# Patient Record
Sex: Male | Born: 1960 | Race: White | Hispanic: No | Marital: Married | Smoking: Former smoker
Health system: Southern US, Community
[De-identification: ages and names within clinical notes are randomized; demographics above are authoritative.]

## PROBLEM LIST (undated history)

## (undated) DIAGNOSIS — M1990 Unspecified osteoarthritis, unspecified site: Secondary | ICD-10-CM

## (undated) DIAGNOSIS — F419 Anxiety disorder, unspecified: Secondary | ICD-10-CM

## (undated) DIAGNOSIS — E785 Hyperlipidemia, unspecified: Secondary | ICD-10-CM

## (undated) DIAGNOSIS — I10 Essential (primary) hypertension: Secondary | ICD-10-CM

## (undated) DIAGNOSIS — G709 Myoneural disorder, unspecified: Secondary | ICD-10-CM

## (undated) DIAGNOSIS — M2550 Pain in unspecified joint: Secondary | ICD-10-CM

## (undated) DIAGNOSIS — F329 Major depressive disorder, single episode, unspecified: Secondary | ICD-10-CM

## (undated) DIAGNOSIS — K760 Fatty (change of) liver, not elsewhere classified: Secondary | ICD-10-CM

## (undated) DIAGNOSIS — M25551 Pain in right hip: Secondary | ICD-10-CM

## (undated) DIAGNOSIS — Z9889 Other specified postprocedural states: Secondary | ICD-10-CM

## (undated) DIAGNOSIS — R600 Localized edema: Secondary | ICD-10-CM

## (undated) DIAGNOSIS — M5030 Other cervical disc degeneration, unspecified cervical region: Secondary | ICD-10-CM

## (undated) DIAGNOSIS — E039 Hypothyroidism, unspecified: Secondary | ICD-10-CM

## (undated) DIAGNOSIS — K449 Diaphragmatic hernia without obstruction or gangrene: Secondary | ICD-10-CM

## (undated) DIAGNOSIS — F32A Depression, unspecified: Secondary | ICD-10-CM

## (undated) DIAGNOSIS — M549 Dorsalgia, unspecified: Secondary | ICD-10-CM

## (undated) DIAGNOSIS — E079 Disorder of thyroid, unspecified: Secondary | ICD-10-CM

## (undated) DIAGNOSIS — R112 Nausea with vomiting, unspecified: Secondary | ICD-10-CM

## (undated) DIAGNOSIS — E119 Type 2 diabetes mellitus without complications: Secondary | ICD-10-CM

## (undated) DIAGNOSIS — M4802 Spinal stenosis, cervical region: Secondary | ICD-10-CM

## (undated) DIAGNOSIS — K219 Gastro-esophageal reflux disease without esophagitis: Secondary | ICD-10-CM

## (undated) DIAGNOSIS — R002 Palpitations: Secondary | ICD-10-CM

## (undated) HISTORY — DX: Gastro-esophageal reflux disease without esophagitis: K21.9

## (undated) HISTORY — PX: TONSILLECTOMY: SUR1361

## (undated) HISTORY — DX: Morbid (severe) obesity due to excess calories: E66.01

## (undated) HISTORY — PX: HYDROCELE EXCISION: SHX482

## (undated) HISTORY — DX: Other cervical disc degeneration, unspecified cervical region: M50.30

## (undated) HISTORY — DX: Depression, unspecified: F32.A

## (undated) HISTORY — DX: Pain in right hip: M25.551

## (undated) HISTORY — PX: CARDIAC CATHETERIZATION: SHX172

## (undated) HISTORY — DX: Localized edema: R60.0

## (undated) HISTORY — DX: Dorsalgia, unspecified: M54.9

## (undated) HISTORY — DX: Anxiety disorder, unspecified: F41.9

## (undated) HISTORY — DX: Spinal stenosis, cervical region: M48.02

## (undated) HISTORY — DX: Pain in unspecified joint: M25.50

## (undated) HISTORY — DX: Diaphragmatic hernia without obstruction or gangrene: K44.9

## (undated) HISTORY — DX: Palpitations: R00.2

## (undated) HISTORY — PX: LAPAROSCOPIC GASTRIC BANDING: SHX1100

## (undated) HISTORY — PX: OTHER SURGICAL HISTORY: SHX169

---
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
## 1988-09-05 DIAGNOSIS — J189 Pneumonia, unspecified organism: Secondary | ICD-10-CM

## 1988-09-05 HISTORY — DX: Pneumonia, unspecified organism: J18.9

## 1998-06-05 ENCOUNTER — Inpatient Hospital Stay (HOSPITAL_COMMUNITY): Admission: AD | Admit: 1998-06-05 | Discharge: 1998-06-06 | Payer: Self-pay | Admitting: Cardiology

## 2012-02-04 HISTORY — PX: LAPAROSCOPIC ROUX-EN-Y GASTRIC BYPASS WITH UPPER ENDOSCOPY AND REMOVAL OF LAP BAND: SHX6505

## 2019-10-15 ENCOUNTER — Other Ambulatory Visit: Payer: Self-pay

## 2019-10-15 ENCOUNTER — Ambulatory Visit
Admission: EM | Admit: 2019-10-15 | Discharge: 2019-10-15 | Disposition: A | Discharge status: Home / Self Care | Payer: PRIVATE HEALTH INSURANCE | Attending: Emergency Medicine | Admitting: Emergency Medicine

## 2019-10-15 ENCOUNTER — Encounter: Payer: Self-pay | Admitting: Emergency Medicine

## 2019-10-15 DIAGNOSIS — L02212 Cutaneous abscess of back [any part, except buttock]: Secondary | ICD-10-CM | POA: Diagnosis not present

## 2019-10-15 HISTORY — DX: Essential (primary) hypertension: I10

## 2019-10-15 HISTORY — DX: Disorder of thyroid, unspecified: E07.9

## 2019-10-15 HISTORY — DX: Hyperlipidemia, unspecified: E78.5

## 2019-10-15 HISTORY — DX: Type 2 diabetes mellitus without complications: E11.9

## 2019-10-15 MED ORDER — CEPHALEXIN 250 MG PO CAPS: 250.0000 mg | ORAL_CAPSULE | Freq: Four times a day (QID) | ORAL | 0 refills | Status: DC

## 2019-10-15 MED ORDER — MUPIROCIN CALCIUM 2 % EX CREA: 1.0000 "application " | TOPICAL_CREAM | Freq: Two times a day (BID) | CUTANEOUS | 0 refills | Status: DC

## 2019-10-15 NOTE — Discharge Instructions (Addendum)
May apply warm compresses  Wash site daily with warm water and mild soap Take antibiotic as prescribed and to completion Follow up here or with PCP if symptoms persists Return or go to the ED if you have any new or worsening symptoms

## 2019-10-15 NOTE — ED Provider Notes (Signed)
[facility]    CSN: [id number] Arrival date & time: 10/15/19  1023      History   Chief Complaint Chief Complaint  Patient presents with  . Abscess    HPI Daniel Scott is a 59 y.o. male.   Who presented to the urgent care with a complaint of abscess to mid back mass, in the past few months.  Reported drainage of White color and redness.  Denies any precipitating event.  He has not used any medication.  Nothing make his symptoms worse.  Denies chills, fever, nausea, vomiting, diarrhea, chest pain, chest tightness, confusion.    The history is provided by the patient. No language interpreter was used.  Abscess   Past Medical History:  Diagnosis Date  . Diabetes mellitus without complication (HCC)   . Hyperlipidemia   . Hypertension   . Thyroid disease     There are no problems to display for this patient.   Past Surgical History:  Procedure Laterality Date  . HYDROCELE EXCISION    . LAPAROSCOPIC GASTRIC BANDING         Home Medications    Prior to Admission medications   Medication Sig Start Date End Date Taking? Authorizing Provider  amLODipine (NORVASC) 10 MG tablet Take 10 mg by mouth daily.   Yes [provider]  aspirin 325 MG tablet Take 325 mg by mouth daily.   Yes [provider]  atorvastatin (LIPITOR) 80 MG tablet Take 80 mg by mouth daily.   Yes [provider]  cholecalciferol (VITAMIN D3) 25 MCG (1000 UNIT) tablet Take 1,000 Units by mouth daily.   Yes [provider]  Dulaglutide (TRULICITY) 1.5 MG/0.5ML SOPN Inject into the skin.   Yes [provider]  Ertugliflozin L-PyroglutamicAc (STEGLATRO) 15 MG TABS Take by mouth.   Yes [provider]  gabapentin (NEURONTIN) 600 MG tablet Take 600 mg by mouth 3 (three) times daily.   Yes [provider]  Garlic 10 MG CAPS Take by mouth.   Yes [provider]  Boris Lown Oil 1000 MG CAPS Take by mouth.   Yes [provider]  levothyroxine (SYNTHROID) 75 MCG tablet Take 75 mcg by mouth daily before breakfast.   Yes [provider]  lisinopril (ZESTRIL) 20 MG tablet Take 20 mg by mouth daily.   Yes [provider]  metFORMIN (GLUCOPHAGE) 500 MG tablet Take by mouth 2 (two) times daily with a meal.   Yes [provider]  Multiple Vitamin (MULTIVITAMIN ADULT PO) Take by mouth.   Yes [provider]  omeprazole (PRILOSEC) 10 MG capsule Take 10 mg by mouth daily.   Yes [provider]  spironolactone (ALDACTONE) 25 MG tablet Take 25 mg by mouth daily.   Yes [provider]  vitamin B-12 (CYANOCOBALAMIN) 500 MCG tablet Take 500 mcg by mouth daily.   Yes [provider]  cephALEXin (KEFLEX) 250 MG capsule Take 1 capsule (250 mg total) by mouth 4 (four) times daily. 10/15/19   Romayne Ticas, Zachery Dakins, FNP  mupirocin cream (BACTROBAN) 2 % Apply 1 application topically 2 (two) times daily. 10/15/19   AvegnoZachery Dakins, FNP    Family History Family History  Problem Relation Age of Onset  . Vascular Disease Mother   . Cancer Mother   . Coronary artery disease Father   . Cancer Father     Social History Social History   Tobacco Use  . Smoking status: Former Games developer  .  Smokeless tobacco: Never Used  Substance Use Topics  . Alcohol use: Yes    Comment: moderately  . Drug use: Never     Allergies   Sulfa antibiotics   Review of Systems Review of Systems  Constitutional: Negative.   Respiratory: Negative.   Cardiovascular: Negative.   Skin: Positive for color change.  All other systems reviewed and are negative.    Physical Exam Triage Vital Signs ED Triage Vitals [10/15/19 1037]  Enc Vitals Group     BP      Pulse      Resp      Temp      Temp src      SpO2      Weight      Height      Head Circumference      Peak Flow      Pain Score 2     Pain Loc      Pain Edu?      Excl. in [place]?    No data found.  Updated Vital  Signs BP 118/66   Pulse 76   Temp 98 F (36.7 C)   Resp 18   SpO2 95%   Visual Acuity Right Eye Distance:   Left Eye Distance:   Bilateral Distance:    Right Eye Near:   Left Eye Near:    Bilateral Near:     Physical Exam Constitutional:      General: He is not in acute distress.    Appearance: Normal appearance. He is normal weight. He is not ill-appearing, toxic-appearing or diaphoretic.  Cardiovascular:     Rate and Rhythm: Normal rate and regular rhythm.     Pulses: Normal pulses.     Heart sounds: Normal heart sounds. No murmur. No gallop.   Pulmonary:     Effort: Pulmonary effort is normal. No respiratory distress.     Breath sounds: Normal breath sounds. No stridor. No wheezing, rhonchi or rales.  Chest:     Chest wall: No tenderness.  Skin:    General: Skin is warm.     Findings: Abscess and erythema present. No abrasion, bruising, ecchymosis, signs of injury, lesion or rash.  Neurological:     Mental Status: He is alert.      UC Treatments / Results  Labs (all labs ordered are listed, but only abnormal results are displayed) Labs Reviewed - No data to display  EKG   Radiology No results found.  Procedures Procedures: incision and debridement 11:00 AM  Procedure: Verbal consent obtained. Area over induration cleaned with betadine. Lidocaine 2% with epinephrine used to obtain local anesthesia. The most fluctuant portion of the abscess was incised with a #11 blade scalpel. Abscess cavity explored and evacuated. Loculations broken up with a curved hemostat as best as possible given patient discomfort. Cavity packed with packing material and dressed with a clean gauze dressing. Minimal bleeding. No complications.  Medications Ordered in UC Medications - No data to display  Initial Impression / Assessment and Plan / UC Course  I have reviewed the triage vital signs and the nursing notes.  Pertinent labs & imaging results that were available during my  care of the patient were reviewed by me and considered in my medical decision making (see chart for details).    Abscess of back Incision and debridement was completed Bactroban was prescribed Keflex was prescribed Care instruction was given Advised patient to return for worsening of symptoms  Final Clinical Impressions(s) /  UC Diagnoses   Final diagnoses:  Abscess of back     Discharge Instructions     Apply warm compresses 3-4x daily for 10-15 minutes Wash site daily with warm water and mild soap Take antibiotic as prescribed and to completion Follow up here or with PCP if symptoms persists Return or go to the ED if you have any new or worsening symptoms     ED Prescriptions    Medication Sig Dispense Auth. Provider   mupirocin cream (BACTROBAN) 2 % Apply 1 application topically 2 (two) times daily. 15 g Jagar Lua, Zachery Dakins, FNP   cephALEXin (KEFLEX) 250 MG capsule Take 1 capsule (250 mg total) by mouth 4 (four) times daily. 28 capsule Nikaya Nasby, Zachery Dakins, FNP     PDMP not reviewed this encounter.   Durward Parcel, FNP 10/15/19 1120

## 2019-10-15 NOTE — ED Triage Notes (Addendum)
Pt presents with complaints of possible abscess to mid back. Pt states that he has had a benign bump there for a few years. Reports over the last few weeks the area has started to swell and been painful. The area is golf ball size.

## 2019-11-09 ENCOUNTER — Ambulatory Visit: Payer: PRIVATE HEALTH INSURANCE | Attending: Internal Medicine

## 2019-11-09 DIAGNOSIS — Z23 Encounter for immunization: Secondary | ICD-10-CM | POA: Insufficient documentation

## 2019-11-09 NOTE — Progress Notes (Signed)
   Covid-19 Vaccination Clinic  Name:  Joni Norrod    MRN: [id number] DOB: 1961/03/21  11/09/2019  Mr. Mccrystal Sr was observed post Covid-19 immunization for 15 minutes without incident. He was provided with Vaccine Information Sheet and instruction to access the V-Safe system.   Mr. Shearn Sr was instructed to call 911 with any severe reactions post vaccine: Marland Kitchen Difficulty breathing  . Swelling of face and throat  . A fast heartbeat  . A bad rash all over body  . Dizziness and weakness   Immunizations Administered    Name Date Dose VIS Date Route   Moderna COVID-19 Vaccine 11/09/2019  9:29 AM 0.5 mL 08/06/2019 Intramuscular   Manufacturer: Moderna   Lot: [id number]   NDC: 51833-582-51

## 2019-12-09 ENCOUNTER — Encounter: Payer: Self-pay | Admitting: Family Medicine

## 2019-12-09 ENCOUNTER — Ambulatory Visit (INDEPENDENT_AMBULATORY_CARE_PROVIDER_SITE_OTHER): Payer: PRIVATE HEALTH INSURANCE | Admitting: Family Medicine

## 2019-12-09 ENCOUNTER — Other Ambulatory Visit: Payer: Self-pay

## 2019-12-09 VITALS — BP 127/82 | HR 74 | Temp 97.1°F | Ht 70.0 in | Wt 342.0 lb

## 2019-12-09 DIAGNOSIS — M5412 Radiculopathy, cervical region: Secondary | ICD-10-CM | POA: Diagnosis not present

## 2019-12-09 DIAGNOSIS — Z130 Encounter for screening for diseases of the blood and blood-forming organs and certain disorders involving the immune mechanism: Secondary | ICD-10-CM

## 2019-12-09 DIAGNOSIS — E039 Hypothyroidism, unspecified: Secondary | ICD-10-CM

## 2019-12-09 DIAGNOSIS — Z794 Long term (current) use of insulin: Secondary | ICD-10-CM

## 2019-12-09 DIAGNOSIS — E1165 Type 2 diabetes mellitus with hyperglycemia: Secondary | ICD-10-CM

## 2019-12-09 LAB — BAYER DCA HB A1C WAIVED: HB A1C (BAYER DCA - WAIVED): 7 % — ABNORMAL HIGH (ref <7.0)

## 2019-12-09 MED ORDER — TRULICITY 1.5 MG/0.5ML ~~LOC~~ SOAJ: 1.5000 mg | SUBCUTANEOUS | 1 refills | Status: DC

## 2019-12-09 MED ORDER — AMLODIPINE BESYLATE 10 MG PO TABS: 10.0000 mg | ORAL_TABLET | Freq: Every day | ORAL | 1 refills | Status: DC

## 2019-12-09 MED ORDER — SPIRONOLACTONE 25 MG PO TABS: 25.0000 mg | ORAL_TABLET | Freq: Every day | ORAL | 1 refills | Status: DC

## 2019-12-09 MED ORDER — METFORMIN HCL 500 MG PO TABS: 500.0000 mg | ORAL_TABLET | Freq: Two times a day (BID) | ORAL | 1 refills | Status: DC

## 2019-12-09 MED ORDER — GABAPENTIN 600 MG PO TABS: 600.0000 mg | ORAL_TABLET | Freq: Every day | ORAL | 1 refills | Status: DC

## 2019-12-09 MED ORDER — LEVOTHYROXINE SODIUM 75 MCG PO TABS: 75.0000 ug | ORAL_TABLET | Freq: Every day | ORAL | 1 refills | Status: DC

## 2019-12-09 MED ORDER — PREDNISONE 10 MG (21) PO TBPK: ORAL_TABLET | ORAL | 0 refills | Status: DC

## 2019-12-09 MED ORDER — ATORVASTATIN CALCIUM 80 MG PO TABS: 80.0000 mg | ORAL_TABLET | Freq: Every day | ORAL | 1 refills | Status: DC

## 2019-12-09 MED ORDER — LISINOPRIL 20 MG PO TABS: 20.0000 mg | ORAL_TABLET | Freq: Every day | ORAL | 1 refills | Status: DC

## 2019-12-09 NOTE — Patient Instructions (Signed)

## 2019-12-09 NOTE — Progress Notes (Signed)
New Patient Office Visit  Assessment & Plan:  1. Cervical radiculopathy - Education provided on cervical radiculopathy. Trial of steroids to see if this improves symptoms.  - predniSONE (STERAPRED UNI-PAK 21 TAB) 10 MG (21) TBPK tablet; As directed x 6 days  Dispense: 21 tablet; Refill: 0  2. Type 2 diabetes mellitus with hyperglycemia, with long-term current use of insulin (HCC) Lab Results  Component Value Date   HGBA1C 7.0 (H) 12/09/2019  - Diabetes is not at goal of A1c < 7 but is very close. - Medications: continue current medications - Patient is currently taking a statin. Patient is taking an ACE-inhibitor/ARB.  - CMP14+EGFR - Lipid panel - Bayer DCA Hb A1c Waived  3. Hypothyroidism, unspecified type - Well controlled on current regimen.  - TSH  4. Screening for deficiency anemia - CBC with Differential/Platelet   Follow-up: Return in about 3 months (around 03/09/2020) for DM.   Hendricks Limes, MSN, APRN, FNP-C [facility]  Subjective:  Patient ID: Daniel Scott, male    DOB: 16-Aug-1961  Age: 59 y.o. MRN: [id number]  Patient Care Team: Loman Brooklyn, FNP as PCP - General (Family Medicine)  CC:  Chief Complaint  Patient presents with  . New Patient (Initial Visit)  . Establish Care  . Spinal Stenosis    Chronic medical condition.  Patient states that he has started losing strength in his left arm.    HPI Daniel Scott presents to establish care. Patient is transferring from [place] and [place].   Patient reports he has been taking steglatro and wants to know if he needs to be on it. He feels it was mistakenly re-prescribed and he didn't notice, therefore he has been taking it.   Patient reports he has chronic neck and left shoulder pain. He feels he is now losing strength in his left arm which is problematic as he is left handed. He has had injections in his neck in the past when he was in [place]. The return of the pain and  weakness has been in the past two weeks. Movement makes it worse.    Review of Systems  Constitutional: Negative for chills, fever, malaise/fatigue and weight loss.  HENT: Negative for congestion, ear discharge, ear pain, nosebleeds, sinus pain, sore throat and tinnitus.   Eyes: Negative for blurred vision, double vision, pain, discharge and redness.  Respiratory: Negative for cough, shortness of breath and wheezing.   Cardiovascular: Negative for chest pain, palpitations and leg swelling.  Gastrointestinal: Negative for abdominal pain, constipation, diarrhea, heartburn, nausea and vomiting.  Genitourinary: Negative for dysuria, frequency and urgency.  Musculoskeletal: Positive for joint pain and neck pain. Negative for myalgias.  Skin: Negative for rash.  Neurological: Negative for dizziness, seizures, weakness and headaches.  Psychiatric/Behavioral: Negative for depression, substance abuse and suicidal ideas. The patient is not nervous/anxious.     Current Outpatient Medications:  .  amLODipine (NORVASC) 10 MG tablet, Take 1 tablet (10 mg total) by mouth daily., Disp: 90 tablet, Rfl: 1 .  aspirin 325 MG tablet, Take 325 mg by mouth daily., Disp: , Rfl:  .  atorvastatin (LIPITOR) 80 MG tablet, Take 1 tablet (80 mg total) by mouth daily., Disp: 90 tablet, Rfl: 1 .  cholecalciferol (VITAMIN D3) 25 MCG (1000 UNIT) tablet, Take 5,000 Units by mouth daily. , Disp: , Rfl:  .  Dulaglutide (TRULICITY) 1.5 GS/8.1JS SOPN, Inject 1.5 mg into the skin once a week., Disp: 12 pen, Rfl:  1 .  Ertugliflozin L-PyroglutamicAc (STEGLATRO) 15 MG TABS, Take 15 mg by mouth daily. , Disp: , Rfl:  .  gabapentin (NEURONTIN) 600 MG tablet, Take 1 tablet (600 mg total) by mouth daily., Disp: 90 tablet, Rfl: 1 .  Garlic 10 MG CAPS, Take 10 mg by mouth in the morning and at bedtime. , Disp: , Rfl:  .  Krill Oil 1000 MG CAPS, Take by mouth., Disp: , Rfl:  .  levothyroxine (SYNTHROID) 75 MCG tablet, Take 1 tablet (75  mcg total) by mouth daily before breakfast., Disp: 90 tablet, Rfl: 1 .  lisinopril (ZESTRIL) 20 MG tablet, Take 1 tablet (20 mg total) by mouth daily., Disp: 90 tablet, Rfl: 1 .  metFORMIN (GLUCOPHAGE) 500 MG tablet, Take 1 tablet (500 mg total) by mouth 2 (two) times daily with a meal., Disp: 180 tablet, Rfl: 1 .  Multiple Vitamin (MULTIVITAMIN ADULT PO), Take by mouth., Disp: , Rfl:  .  omeprazole (PRILOSEC) 20 MG capsule, Take 20 mg by mouth daily. 1 tablet every other day, Disp: , Rfl:  .  spironolactone (ALDACTONE) 25 MG tablet, Take 1 tablet (25 mg total) by mouth daily., Disp: 90 tablet, Rfl: 1 .  vitamin B-12 (CYANOCOBALAMIN) 500 MCG tablet, Take 5,000 mcg by mouth daily. , Disp: , Rfl:  .  predniSONE (STERAPRED UNI-PAK 21 TAB) 10 MG (21) TBPK tablet, As directed x 6 days, Disp: 21 tablet, Rfl: 0  Allergies  Allergen Reactions  . Sulfa Antibiotics     Past Medical History:  Diagnosis Date  . Depression   . Diabetes mellitus without complication ([place])   . Hiatal hernia   . Hyperlipidemia   . Hypertension   . Stenosis of cervical spine   . Thyroid disease     Past Surgical History:  Procedure Laterality Date  . HYDROCELE EXCISION    . LAPAROSCOPIC GASTRIC BANDING      Family History  Problem Relation Age of Onset  . Vascular Disease Mother   . Cancer Mother   . Kidney disease Mother   . Thyroid disease Mother   . Hypertension Mother   . Coronary artery disease Father   . Colon cancer Father   . Heart disease Father   . Hyperlipidemia Father   . Hypertension Father   . Spina bifida Daughter   . Depression Son   . Anxiety disorder Son   . Hypertension Son   . Breast cancer Maternal Grandmother   . Hypertension Maternal Grandmother   . Stroke Maternal Grandmother   . Lung cancer Maternal Grandfather   . Diabetes Maternal Grandfather   . Heart disease Maternal Grandfather   . Hypertension Maternal Grandfather   . Stroke Maternal Grandfather   . Hypertension  Paternal Grandmother   . Heart disease Paternal Grandfather   . Hypertension Paternal Grandfather   . Autism Son   . Bipolar disorder Son   . Autism Son     Social History   Socioeconomic History  . Marital status: Married    Spouse name: Not on file  . Number of children: Not on file  . Years of education: Not on file  . Highest education level: Not on file  Occupational History  . Not on file  Tobacco Use  . Smoking status: Former Smoker    Types: Cigarettes    Quit date: 05/2009    Years since quitting: 10.6  . Smokeless tobacco: Never Used  Substance and Sexual Activity  . Alcohol use: Yes  Comment: moderately. 12/09/19- Patient states he has 12 drinks a week.  . Drug use: Yes    Types: Marijuana    Comment: occ  . Sexual activity: Yes    Birth control/protection: None  Other Topics Concern  . Not on file  Social History Narrative  . Not on file   Social Determinants of Health   Financial Resource Strain:   . Difficulty of Paying Living Expenses:   Food Insecurity:   . Worried About Charity fundraiser in the Last Year:   . Arboriculturist in the Last Year:   Transportation Needs:   . Film/video editor (Medical):   Marland Kitchen Lack of Transportation (Non-Medical):   Physical Activity:   . Days of Exercise per Week:   . Minutes of Exercise per Session:   Stress:   . Feeling of Stress :   Social Connections:   . Frequency of Communication with Friends and Family:   . Frequency of Social Gatherings with Friends and Family:   . Attends Religious Services:   . Active Member of Clubs or Organizations:   . Attends Archivist Meetings:   Marland Kitchen Marital Status:   Intimate Partner Violence:   . Fear of Current or Ex-Partner:   . Emotionally Abused:   Marland Kitchen Physically Abused:   . Sexually Abused:     Objective:   Today's Vitals: BP 127/82   Pulse 74   Temp (!) 97.1 F (36.2 C) (Temporal)   Ht 5' 10"  (1.778 m)   Wt (!) 342 lb (155.1 kg)   SpO2 98%   BMI  49.07 kg/m   Physical Exam Vitals reviewed.  Constitutional:      General: He is not in acute distress.    Appearance: Normal appearance. He is morbidly obese. He is not ill-appearing, toxic-appearing or diaphoretic.  HENT:     Head: Normocephalic and atraumatic.  Eyes:     General: No scleral icterus.       Right eye: No discharge.        Left eye: No discharge.     Conjunctiva/sclera: Conjunctivae normal.  Cardiovascular:     Rate and Rhythm: Normal rate and regular rhythm.     Heart sounds: Normal heart sounds. No murmur. No friction rub. No gallop.   Pulmonary:     Effort: Pulmonary effort is normal. No respiratory distress.     Breath sounds: Normal breath sounds. No stridor. No wheezing, rhonchi or rales.  Musculoskeletal:        General: Normal range of motion.     Cervical back: Normal range of motion.  Skin:    General: Skin is warm and dry.  Neurological:     Mental Status: He is alert and oriented to person, place, and time. Mental status is at baseline.  Psychiatric:        Mood and Affect: Mood normal.        Behavior: Behavior normal.        Thought Content: Thought content normal.        Judgment: Judgment normal.

## 2019-12-10 LAB — LIPID PANEL
Chol/HDL Ratio: 3 ratio (ref 0.0–5.0)
Cholesterol, Total: 136 mg/dL (ref 100–199)
HDL: 45 mg/dL (ref >39)
LDL Chol Calc (NIH): 67 mg/dL (ref 0–99)
Triglycerides: 140 mg/dL (ref 0–149)
VLDL Cholesterol Cal: 24 mg/dL (ref 5–40)

## 2019-12-10 LAB — CBC WITH DIFFERENTIAL/PLATELET
Basophils Absolute: 0 10*3/uL (ref 0.0–0.2)
Basos: 1 %
EOS (ABSOLUTE): 0.1 10*3/uL (ref 0.0–0.4)
Eos: 2 %
Hematocrit: 41.5 % (ref 37.5–51.0)
Hemoglobin: 13.8 g/dL (ref 13.0–17.7)
Immature Grans (Abs): 0 10*3/uL (ref 0.0–0.1)
Immature Granulocytes: 0 %
Lymphocytes Absolute: 1.5 10*3/uL (ref 0.7–3.1)
Lymphs: 25 %
MCH: 32.2 pg (ref 26.6–33.0)
MCHC: 33.3 g/dL (ref 31.5–35.7)
MCV: 97 fL (ref 79–97)
Monocytes Absolute: 0.6 10*3/uL (ref 0.1–0.9)
Monocytes: 11 %
Neutrophils Absolute: 3.6 10*3/uL (ref 1.4–7.0)
Neutrophils: 61 %
Platelets: 238 10*3/uL (ref 150–450)
RBC: 4.28 x10E6/uL (ref 4.14–5.80)
RDW: 13.5 % (ref 11.6–15.4)
WBC: 5.9 10*3/uL (ref 3.4–10.8)

## 2019-12-10 LAB — CMP14+EGFR
ALT: 38 IU/L (ref 0–44)
AST: 25 IU/L (ref 0–40)
Albumin/Globulin Ratio: 2 (ref 1.2–2.2)
Albumin: 4.2 g/dL (ref 3.8–4.9)
Alkaline Phosphatase: 79 IU/L (ref 39–117)
BUN/Creatinine Ratio: 20 (ref 10–24)
BUN: 16 mg/dL (ref 8–27)
Bilirubin Total: 0.3 mg/dL (ref 0.0–1.2)
CO2: 23 mmol/L (ref 20–29)
Calcium: 9.6 mg/dL (ref 8.6–10.2)
Chloride: 102 mmol/L (ref 96–106)
Creatinine, Ser: 0.8 mg/dL (ref 0.76–1.27)
GFR calc Af Amer: 112 mL/min/{1.73_m2} (ref >59)
GFR calc non Af Amer: 97 mL/min/{1.73_m2} (ref >59)
Globulin, Total: 2.1 g/dL (ref 1.5–4.5)
Glucose: 113 mg/dL — ABNORMAL HIGH (ref 65–99)
Potassium: 4.9 mmol/L (ref 3.5–5.2)
Sodium: 140 mmol/L (ref 134–144)
Total Protein: 6.3 g/dL (ref 6.0–8.5)

## 2019-12-10 LAB — TSH: TSH: 2.85 u[IU]/mL (ref 0.450–4.500)

## 2019-12-11 ENCOUNTER — Ambulatory Visit: Payer: PRIVATE HEALTH INSURANCE | Attending: Internal Medicine

## 2019-12-11 DIAGNOSIS — Z23 Encounter for immunization: Secondary | ICD-10-CM

## 2019-12-11 NOTE — Progress Notes (Signed)
   Covid-19 Vaccination Clinic  Name:  Daniel Scott    MRN: [id number] DOB: [birth year]  12/11/2019  Daniel Scott was observed post Covid-19 immunization for 15 minutes without incident. He was provided with Vaccine Information Sheet and instruction to access the V-Safe system.   Daniel Scott was instructed to call 911 with any severe reactions post vaccine: Marland Kitchen Difficulty breathing  . Swelling of face and throat  . A fast heartbeat  . A bad rash all over body  . Dizziness and weakness   Immunizations Administered    Name Date Dose VIS Date Route   Moderna COVID-19 Vaccine 12/11/2019  9:21 AM 0.5 mL 08/06/2019 Intramuscular   Manufacturer: Gala Murdoch   Lot: [id number]   NDC: 44584-835-07

## 2019-12-16 ENCOUNTER — Encounter: Payer: Self-pay | Admitting: Family Medicine

## 2019-12-23 ENCOUNTER — Ambulatory Visit
Admission: EM | Admit: 2019-12-23 | Discharge: 2019-12-23 | Disposition: A | Discharge status: Home / Self Care | Payer: PRIVATE HEALTH INSURANCE | Attending: Emergency Medicine | Admitting: Emergency Medicine

## 2019-12-23 ENCOUNTER — Other Ambulatory Visit: Payer: Self-pay

## 2019-12-23 ENCOUNTER — Ambulatory Visit (INDEPENDENT_AMBULATORY_CARE_PROVIDER_SITE_OTHER): Payer: PRIVATE HEALTH INSURANCE

## 2019-12-23 DIAGNOSIS — M25572 Pain in left ankle and joints of left foot: Secondary | ICD-10-CM | POA: Diagnosis not present

## 2019-12-23 DIAGNOSIS — M79672 Pain in left foot: Secondary | ICD-10-CM | POA: Diagnosis not present

## 2019-12-23 IMAGING — DX DG FOOT COMPLETE 3+V*L*
3 series · 3 of 3 positions shown · non-contrast
Comparison: None.

CLINICAL DATA: Left foot pain for 6 weeks without known injury.

EXAM:
LEFT FOOT - COMPLETE 3+ VIEW

[foot ap]
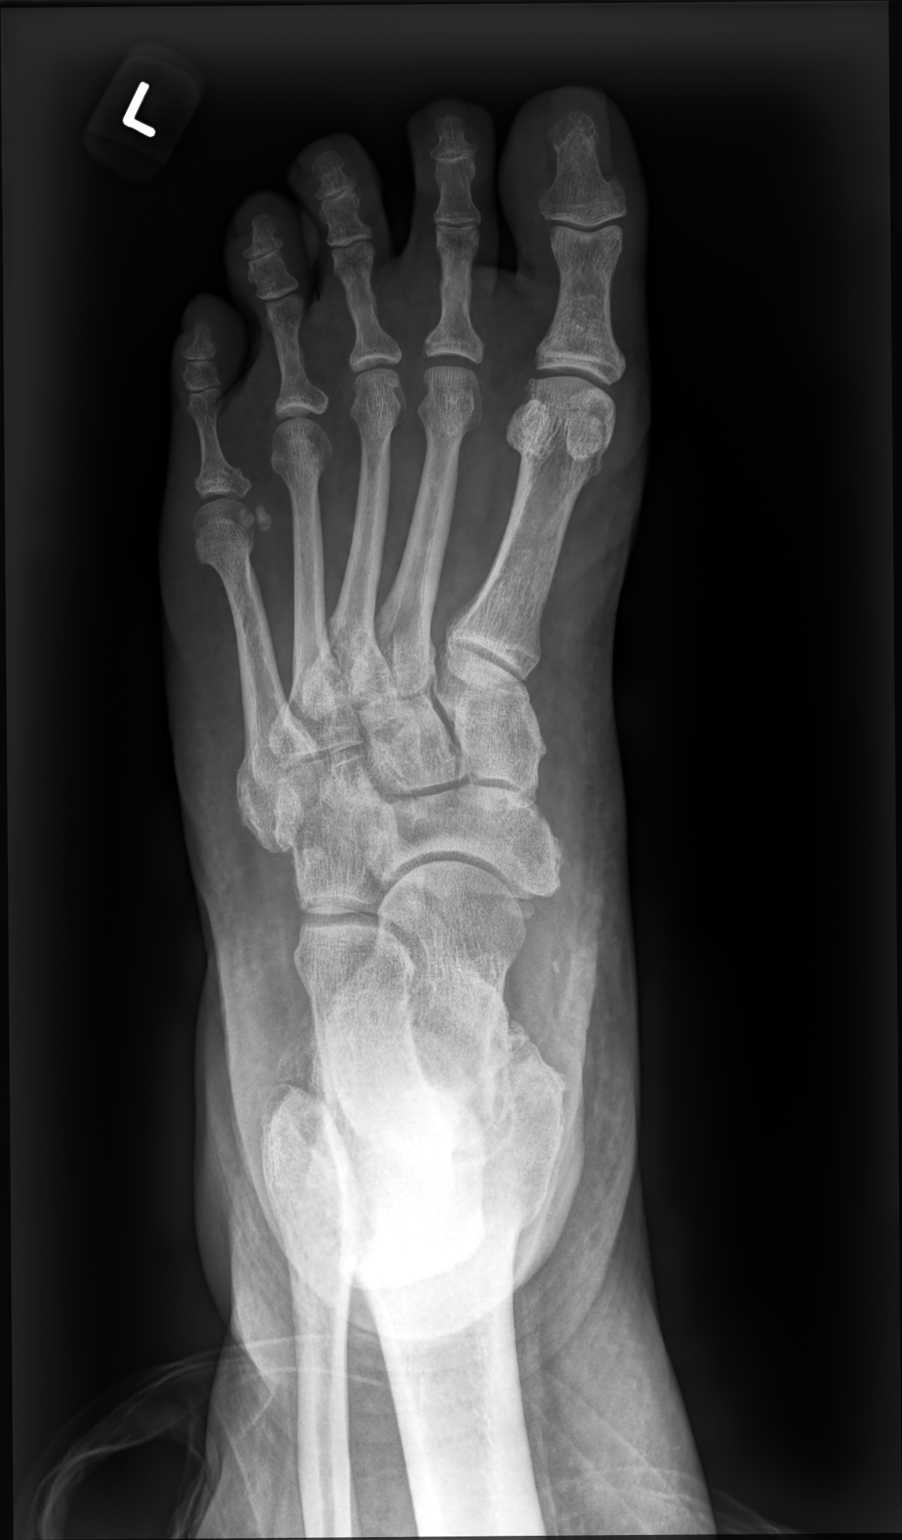

[foot mlo]
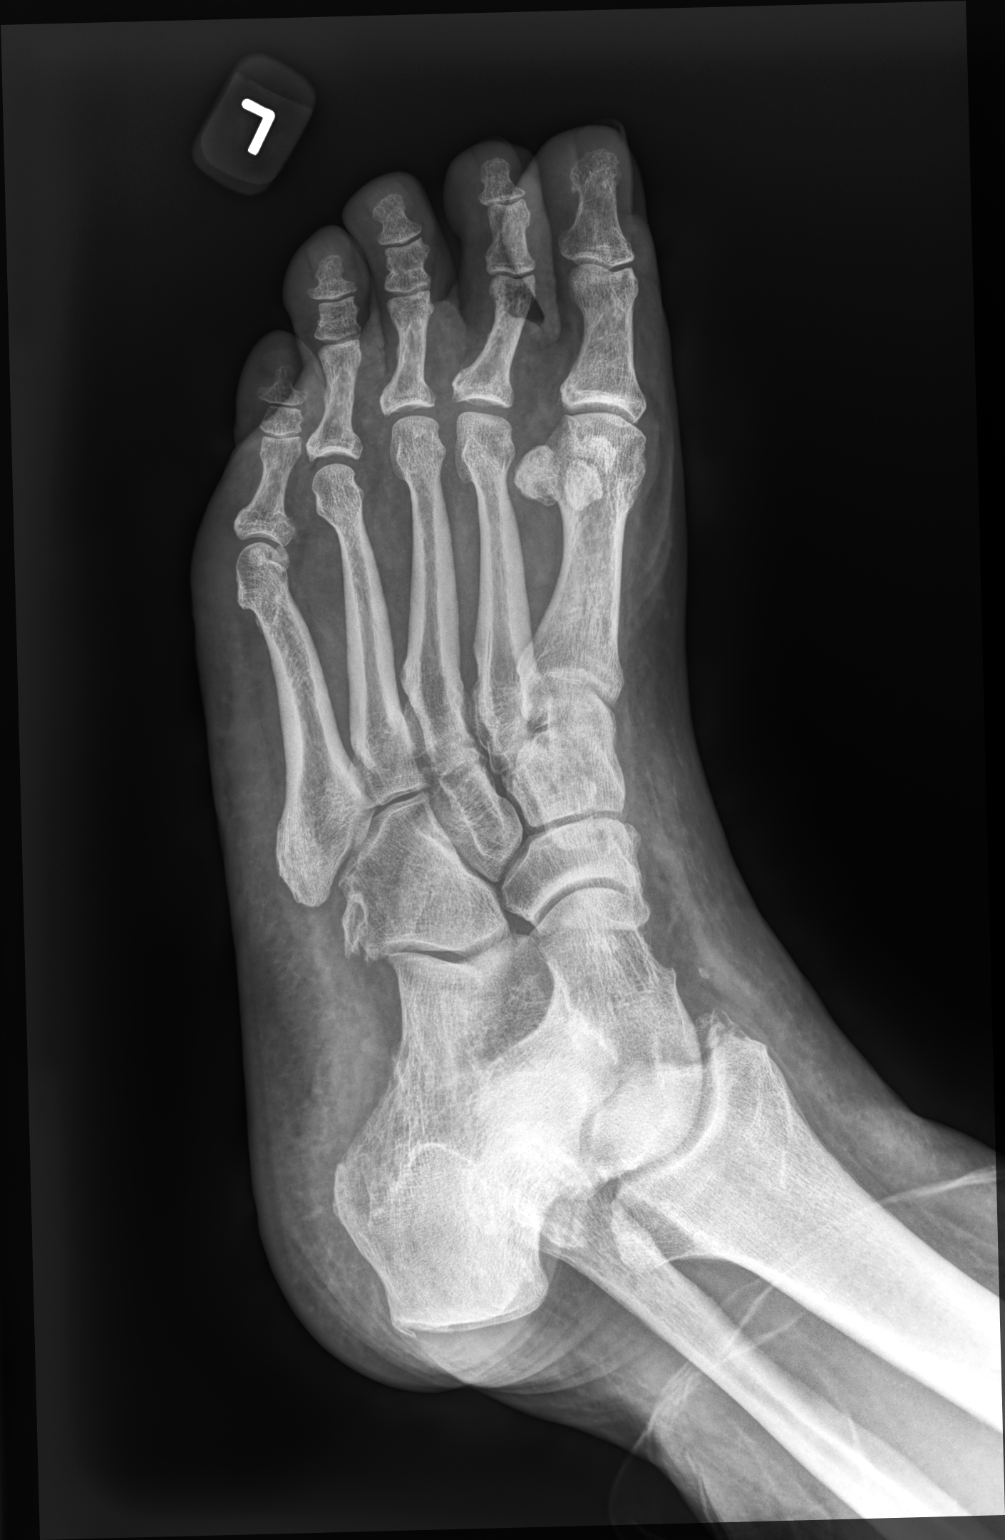

[foot lat]
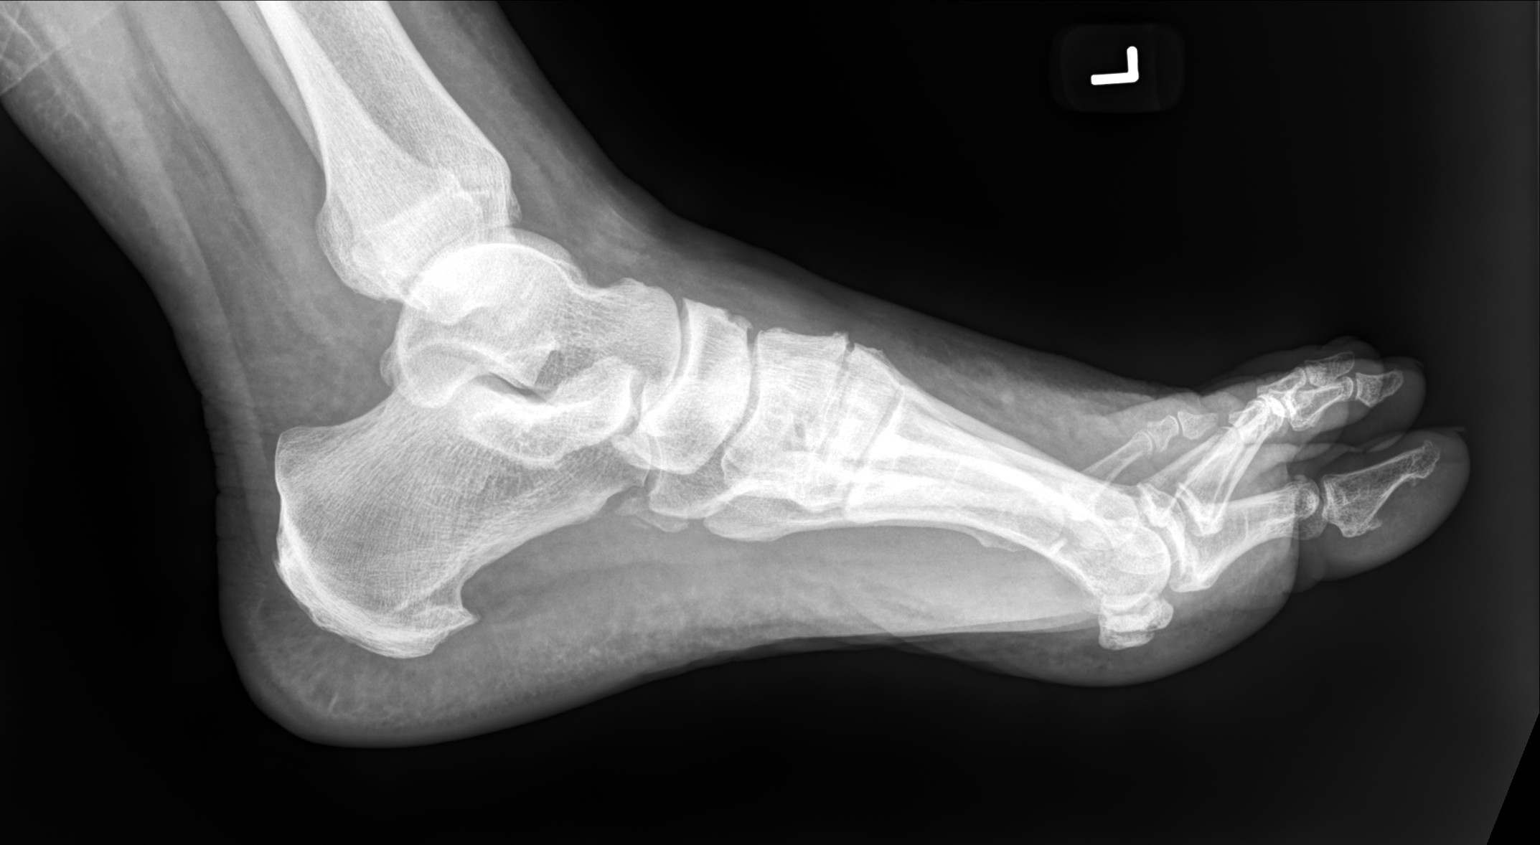

[3 of 3 positions shown; findings below may reference images not displayed]

FINDINGS: There is no evidence of fracture or dislocation. There is no
evidence of arthropathy or other focal bone abnormality. Soft
tissues are unremarkable.
IMPRESSION: Negative.

## 2019-12-23 MED ORDER — PREDNISONE 10 MG (21) PO TBPK: ORAL_TABLET | ORAL | 0 refills | Status: DC

## 2019-12-23 NOTE — ED Triage Notes (Signed)
Pt began having left  ankle and foot pain for past 6 weeks and has become worse in past week, swelling to top of foot and bruising noted to toes

## 2019-12-23 NOTE — Discharge Instructions (Signed)
Continue to take ibuprofen as prescribed Colchicine was prescribed Advised to follow RICE instruction that is attached Follow-up with PCP Return or go to ED for worsening of symptoms

## 2019-12-23 NOTE — ED Provider Notes (Signed)
[facility]    CSN: [id number] Arrival date & time: 12/23/19  4332      History   Chief Complaint Chief Complaint  Patient presents with  . Joint Swelling    HPI Daniel Scott is a 59 y.o. male.   Who presented to the urgent care with a complaint of left ankle and foot pain for the past 6 weeks.  Reported worsening pain and swelling last week.  Denies any precipitating event.  Localized pain to the left ankle.  Described the pain as constant, achy, and rated at 4 on a scale 1-10.  Has tried OTC ibuprofen with mild relief.  His symptoms are made worse with ROM.  He denies similar symptoms in the past.  Denies chills, fever, injury or trauma.   The history is provided by the patient. No language interpreter was used.    Past Medical History:  Diagnosis Date  . Depression   . Diabetes mellitus without complication (HCC)   . Hiatal hernia   . Hyperlipidemia   . Hypertension   . Stenosis of cervical spine   . Thyroid disease     There are no problems to display for this patient.   Past Surgical History:  Procedure Laterality Date  . HYDROCELE EXCISION    . LAPAROSCOPIC GASTRIC BANDING         Home Medications    Prior to Admission medications   Medication Sig Start Date End Date Taking? Authorizing Provider  amLODipine (NORVASC) 10 MG tablet Take 1 tablet (10 mg total) by mouth daily. 12/09/19   Gwenlyn Fudge, FNP  aspirin 325 MG tablet Take 325 mg by mouth daily.    [provider]  atorvastatin (LIPITOR) 80 MG tablet Take 1 tablet (80 mg total) by mouth daily. 12/09/19   [name] F, FNP  cholecalciferol (VITAMIN D3) 25 MCG (1000 UNIT) tablet Take 5,000 Units by mouth daily.     [provider]  Dulaglutide (TRULICITY) 1.5 MG/0.5ML SOPN Inject 1.5 mg into the skin once a week. 12/09/19   Gwenlyn Fudge, FNP  Ertugliflozin L-PyroglutamicAc (STEGLATRO) 15 MG TABS Take 15 mg by mouth daily.     [provider]   gabapentin (NEURONTIN) 600 MG tablet Take 1 tablet (600 mg total) by mouth daily. 12/09/19   Gwenlyn Fudge, FNP  Garlic 10 MG CAPS Take 10 mg by mouth in the morning and at bedtime.     [provider]  Boris Lown Oil 1000 MG CAPS Take by mouth.    [provider]  levothyroxine (SYNTHROID) 75 MCG tablet Take 1 tablet (75 mcg total) by mouth daily before breakfast. 12/09/19   Gwenlyn Fudge, FNP  lisinopril (ZESTRIL) 20 MG tablet Take 1 tablet (20 mg total) by mouth daily. 12/09/19   Gwenlyn Fudge, FNP  metFORMIN (GLUCOPHAGE) 500 MG tablet Take 1 tablet (500 mg total) by mouth 2 (two) times daily with a meal. 12/09/19   [name] F, FNP  Multiple Vitamin (MULTIVITAMIN ADULT PO) Take by mouth.    [provider]  omeprazole (PRILOSEC) 20 MG capsule Take 20 mg by mouth daily. 1 tablet every other day    [provider]  predniSONE (STERAPRED UNI-PAK 21 TAB) 10 MG (21) TBPK tablet Take 6 tabs by mouth daily  for 2 days, then 5 tabs for 2 days, then 4 tabs for 2 days, then 3 tabs for 2 days, 2 tabs for 2 days, then 1  tab by mouth daily for 2 days 12/23/19   Emerson Monte, FNP  spironolactone (ALDACTONE) 25 MG tablet Take 1 tablet (25 mg total) by mouth daily. 12/09/19   Loman Brooklyn, FNP  vitamin B-12 (CYANOCOBALAMIN) 500 MCG tablet Take 5,000 mcg by mouth daily.     [provider]    Family History Family History  Problem Relation Age of Onset  . Vascular Disease Mother   . Cancer Mother   . Kidney disease Mother   . Thyroid disease Mother   . Hypertension Mother   . Coronary artery disease Father   . Colon cancer Father   . Heart disease Father   . Hyperlipidemia Father   . Hypertension Father   . Spina bifida Daughter   . Depression Son   . Anxiety disorder Son   . Hypertension Son   . Breast cancer Maternal Grandmother   . Hypertension Maternal Grandmother   . Stroke Maternal Grandmother   . Lung cancer Maternal Grandfather   .  Diabetes Maternal Grandfather   . Heart disease Maternal Grandfather   . Hypertension Maternal Grandfather   . Stroke Maternal Grandfather   . Hypertension Paternal Grandmother   . Heart disease Paternal Grandfather   . Hypertension Paternal Grandfather   . Autism Son   . Bipolar disorder Son   . Autism Son     Social History Social History   Tobacco Use  . Smoking status: Former Smoker    Types: Cigarettes    Quit date: 05/2009    Years since quitting: 10.6  . Smokeless tobacco: Never Used  Substance Use Topics  . Alcohol use: Yes    Comment: moderately. 12/09/19- Patient states he has 12 drinks a week.  . Drug use: Yes    Types: Marijuana    Comment: occ     Allergies   Sulfa antibiotics   Review of Systems Review of Systems  Constitutional: Negative.   Respiratory: Negative.   Cardiovascular: Negative.   Musculoskeletal: Positive for arthralgias and joint swelling.  All other systems reviewed and are negative.    Physical Exam Triage Vital Signs ED Triage Vitals  Enc Vitals Group     BP 12/23/19 0903 131/83     Pulse Rate 12/23/19 0903 80     Resp 12/23/19 0903 18     Temp 12/23/19 0903 98.6 F (37 C)     Temp src --      SpO2 12/23/19 0903 96 %     Weight --      Height --      Head Circumference --      Peak Flow --      Pain Score 12/23/19 0900 4     Pain Loc --      Pain Edu? --      Excl. in [place]? --    No data found.  Updated Vital Signs BP 131/83   Pulse 80   Temp 98.6 F (37 C)   Resp 18   SpO2 96%   Visual Acuity Right Eye Distance:   Left Eye Distance:   Bilateral Distance:    Right Eye Near:   Left Eye Near:    Bilateral Near:     Physical Exam Vitals and nursing note reviewed.  Constitutional:      General: He is not in acute distress.    Appearance: Normal appearance. He is normal weight. He is not ill-appearing, toxic-appearing or diaphoretic.  Cardiovascular:  Rate and Rhythm: Normal rate and regular rhythm.      Pulses: Normal pulses.     Heart sounds: Normal heart sounds. No murmur. No friction rub. No gallop.   Pulmonary:     Effort: Pulmonary effort is normal. No respiratory distress.     Breath sounds: Normal breath sounds. No stridor. No wheezing, rhonchi or rales.  Chest:     Chest wall: No tenderness.  Musculoskeletal:        General: Swelling and tenderness present.     Right ankle: Normal.     Left ankle: No swelling. Tenderness present.     Left foot: Swelling and tenderness present.     Comments: The right foot is without any obvious asymmetry when compared to the left foot.  No surface trauma, erythema, warmth present.  Patient able to flex/extend, invert/evert with pain.  Neurovascular status intact.  Neurological:     Mental Status: He is alert and oriented to person, place, and time.      UC Treatments / Results  Labs (all labs ordered are listed, but only abnormal results are displayed) Labs Reviewed - No data to display  EKG   Radiology DG Foot Complete Left  Result Date: 12/23/2019 CLINICAL DATA:  Left foot pain for 6 weeks without known injury. EXAM: LEFT FOOT - COMPLETE 3+ VIEW COMPARISON:  None. FINDINGS: There is no evidence of fracture or dislocation. There is no evidence of arthropathy or other focal bone abnormality. Soft tissues are unremarkable. IMPRESSION: Negative. Electronically Signed   By: Lupita Raider M.D.   On: 12/23/2019 09:24    Procedures Procedures (including critical care time)  Medications Ordered in UC Medications - No data to display  Initial Impression / Assessment and Plan / UC Course  I have reviewed the triage vital signs and the nursing notes.  Pertinent labs & imaging results that were available during my care of the patient were reviewed by me and considered in my medical decision making (see chart for details).   Patient stable at discharge.  X-ray is negative for bony abnormality including fracture or dislocation.  I have  reviewed the x-ray myself and the radiologist interpretation.  I am in agreement with the radiologist interpretation.    Was advised to follow-up with PCP.  Return or go to ED for worsening symptoms.  Final Clinical Impressions(s) / UC Diagnoses   Final diagnoses:  Left foot pain  Acute left ankle pain     Discharge Instructions     Continue to take ibuprofen as prescribed Colchicine was prescribed Advised to follow RICE instruction that is attached Follow-up with PCP Return or go to ED for worsening of symptoms    ED Prescriptions    Medication Sig Dispense Auth. Provider   predniSONE (STERAPRED UNI-PAK 21 TAB) 10 MG (21) TBPK tablet Take 6 tabs by mouth daily  for 2 days, then 5 tabs for 2 days, then 4 tabs for 2 days, then 3 tabs for 2 days, 2 tabs for 2 days, then 1 tab by mouth daily for 2 days 42 tablet Jeanice Dempsey, Zachery Dakins, FNP     PDMP not reviewed this encounter.   Durward Parcel, FNP 12/23/19 1017

## 2020-01-22 ENCOUNTER — Encounter: Payer: Self-pay | Admitting: Family Medicine

## 2020-02-10 ENCOUNTER — Encounter: Payer: Self-pay | Admitting: Family Medicine

## 2020-02-12 ENCOUNTER — Ambulatory Visit
Admission: EM | Admit: 2020-02-12 | Discharge: 2020-02-12 | Disposition: A | Discharge status: Home / Self Care | Payer: PRIVATE HEALTH INSURANCE | Attending: Emergency Medicine | Admitting: Emergency Medicine

## 2020-02-12 ENCOUNTER — Other Ambulatory Visit: Payer: Self-pay

## 2020-02-12 DIAGNOSIS — J0110 Acute frontal sinusitis, unspecified: Secondary | ICD-10-CM | POA: Diagnosis not present

## 2020-02-12 MED ORDER — AZITHROMYCIN 250 MG PO TABS: 250.0000 mg | ORAL_TABLET | Freq: Every day | ORAL | 0 refills | Status: DC

## 2020-02-12 MED ORDER — CETIRIZINE HCL 10 MG PO TABS: 10.0000 mg | ORAL_TABLET | Freq: Every day | ORAL | 0 refills | Status: DC

## 2020-02-12 MED ORDER — FLUTICASONE PROPIONATE 50 MCG/ACT NA SUSP: 1.0000 | Freq: Every day | NASAL | 0 refills | Status: DC

## 2020-02-12 MED ORDER — PREDNISONE 10 MG PO TABS: 20.0000 mg | ORAL_TABLET | Freq: Every day | ORAL | 0 refills | Status: AC

## 2020-02-12 NOTE — ED Provider Notes (Signed)
Daniel Scott   [id number] 02/12[birth year]20 Arrival Time: 0800  [id number]  SUBJECTIVE:  Daniel Scott is a 59 y.o. male who complains of headache for the past 6 days.  Denies a precipitating event, or recent head trauma.  Patient localizes her pain to the frontalhead.  Describes the pain as constant, tender and pressure in character.  Patient has tried OTC ibuprofen without relief. Symptoms are made worse with moving.denies similar symptoms in the past.  This is not the worst headache of their life.  Patient denies fever, chills, nausea, vomiting, aura, rhinorrhea, watery eyes, chest pain, SOB, abdominal pain, weakness, numbness or tingling, slurred speech.     ROS: As per HPI.  All other pertinent ROS negative.     Past Medical History:  Diagnosis Date  . Depression   . Diabetes mellitus without complication ([place])   . Hiatal hernia   . Hyperlipidemia   . Hypertension   . Stenosis of cervical spine   . Thyroid disease    Past Surgical History:  Procedure Laterality Date  . HYDROCELE EXCISION    . LAPAROSCOPIC GASTRIC BANDING     Allergies  Allergen Reactions  . Sulfa Antibiotics    No current facility-administered medications on file prior to encounter.   Current Outpatient Medications on File Prior to Encounter  Medication Sig Dispense Refill  . amLODipine (NORVASC) 10 MG tablet Take 1 tablet (10 mg total) by mouth daily. 90 tablet 1  . aspirin 325 MG tablet Take 325 mg by mouth daily.    Marland Kitchen atorvastatin (LIPITOR) 80 MG tablet Take 1 tablet (80 mg total) by mouth daily. 90 tablet 1  . cholecalciferol (VITAMIN D3) 25 MCG (1000 UNIT) tablet Take 5,000 Units by mouth daily.     . Dulaglutide (TRULICITY) 1.5 YF/7.4BS SOPN Inject 1.5 mg into the skin once a week. 12 pen 1  . Ertugliflozin L-PyroglutamicAc (STEGLATRO) 15 MG TABS Take 15 mg by mouth daily.     Marland Kitchen gabapentin (NEURONTIN) 600 MG tablet Take 1 tablet (600 mg total) by mouth daily. 90 tablet 1  . Garlic 10 MG CAPS  Take 10 mg by mouth in the morning and at bedtime.     Daniel Scott 1000 MG CAPS Take by mouth.    . levothyroxine (SYNTHROID) 75 MCG tablet Take 1 tablet (75 mcg total) by mouth daily before breakfast. 90 tablet 1  . lisinopril (ZESTRIL) 20 MG tablet Take 1 tablet (20 mg total) by mouth daily. 90 tablet 1  . metFORMIN (GLUCOPHAGE) 500 MG tablet Take 1 tablet (500 mg total) by mouth 2 (two) times daily with a meal. 180 tablet 1  . Multiple Vitamin (MULTIVITAMIN ADULT PO) Take by mouth.    Marland Kitchen omeprazole (PRILOSEC) 20 MG capsule Take 20 mg by mouth daily. 1 tablet every other day    . spironolactone (ALDACTONE) 25 MG tablet Take 1 tablet (25 mg total) by mouth daily. 90 tablet 1  . vitamin B-12 (CYANOCOBALAMIN) 500 MCG tablet Take 5,000 mcg by mouth daily.      Social History   Socioeconomic History  . Marital status: Married    Spouse name: Not on file  . Number of children: Not on file  . Years of education: Not on file  . Highest education level: Not on file  Occupational History  . Not on file  Tobacco Use  . Smoking status: Former Smoker    Types: Cigarettes    Quit date: 05/2009  Years since quitting: 10.7  . Smokeless tobacco: Never Used  Substance and Sexual Activity  . Alcohol use: Yes    Comment: moderately. 12/09/19- Patient states he has 12 drinks a week.  . Drug use: Yes    Types: Marijuana    Comment: occ  . Sexual activity: Yes    Birth control/protection: None  Other Topics Concern  . Not on file  Social History Narrative  . Not on file   Social Determinants of Health   Financial Resource Strain:   . Difficulty of Paying Living Expenses:   Food Insecurity:   . Worried About Programme researcher, broadcasting/film/video in the Last Year:   . Barista in the Last Year:   Transportation Needs:   . Freight forwarder (Medical):   Marland Kitchen Lack of Transportation (Non-Medical):   Physical Activity:   . Days of Exercise per Week:   . Minutes of Exercise per Session:   Stress:   .  Feeling of Stress :   Social Connections:   . Frequency of Communication with Friends and Family:   . Frequency of Social Gatherings with Friends and Family:   . Attends Religious Services:   . Active Member of Clubs or Organizations:   . Attends Banker Meetings:   Marland Kitchen Marital Status:   Intimate Partner Violence:   . Fear of Current or Ex-Partner:   . Emotionally Abused:   Marland Kitchen Physically Abused:   . Sexually Abused:    Family History  Problem Relation Age of Onset  . Vascular Disease Mother   . Cancer Mother        Bone marrow  . Kidney disease Mother   . Thyroid disease Mother   . Hypertension Mother   . Diabetes Mother   . Coronary artery disease Father   . Colon cancer Father   . Heart disease Father   . Hyperlipidemia Father   . Hypertension Father   . Spina bifida Daughter   . Depression Son   . Anxiety disorder Son   . Hypertension Son   . Breast cancer Maternal Grandmother   . Hypertension Maternal Grandmother   . Stroke Maternal Grandmother   . Lung cancer Maternal Grandfather   . Diabetes Maternal Grandfather   . Heart disease Maternal Grandfather   . Hypertension Maternal Grandfather   . Stroke Maternal Grandfather   . Hypertension Paternal Grandmother   . Heart disease Paternal Grandfather   . Hypertension Paternal Grandfather   . Autism Son   . Bipolar disorder Son   . Autism Son     OBJECTIVE:  Vitals:   02/12/20 0809  BP: 130/81  Pulse: 92  Resp: 16  Temp: (!) 97.3 F (36.3 C)  SpO2: 96%    Physical Exam Vitals and nursing note reviewed.  Constitutional:      General: He is not in acute distress.    Appearance: He is well-developed and normal weight. He is not ill-appearing, toxic-appearing or diaphoretic.  HENT:     Head: Normocephalic and atraumatic.     Right Ear: Tympanic membrane, ear canal and external ear normal. There is no impacted cerumen.     Left Ear: Tympanic membrane, ear canal and external ear normal. There is  no impacted cerumen.     Nose: Congestion present.     Right Sinus: Frontal sinus tenderness present.     Left Sinus: Frontal sinus tenderness present.     Comments: With yellowish green discharge  Mouth/Throat:     Mouth: Mucous membranes are moist.     Pharynx: Oropharynx is clear.  Eyes:     General: Lids are normal. Vision grossly intact. Gaze aligned appropriately.        Right eye: Discharge present.        Left eye: Discharge present.    Comments: Watery eye discharge  Cardiovascular:     Rate and Rhythm: Normal rate and regular rhythm.     Pulses: Normal pulses.     Heart sounds: Normal heart sounds. No murmur. No friction rub. No gallop.   Pulmonary:     Effort: Pulmonary effort is normal. No respiratory distress.     Breath sounds: Normal breath sounds. No stridor. No wheezing, rhonchi or rales.  Chest:     Chest wall: No tenderness.  Neurological:     General: No focal deficit present.     Mental Status: He is alert and oriented to person, place, and time. Mental status is at baseline.     Cranial Nerves: Cranial nerves are intact. No cranial nerve deficit.     Sensory: Sensation is intact. No sensory deficit.     Motor: Motor function is intact. No weakness.     Coordination: Coordination is intact. Coordination normal.     Gait: Gait is intact. Gait normal.     Deep Tendon Reflexes: Reflexes normal.     Reflex Scores:      Patellar reflexes are 2+ on the right side and 2+ on the left side.     ASSESSMENT & PLAN:  No diagnosis found.  Meds ordered this encounter  Medications  . predniSONE (DELTASONE) 10 MG tablet    Sig: Take 2 tablets (20 mg total) by mouth daily for 5 days.    Dispense:  10 tablet    Refill:  0  . cetirizine (ZYRTEC ALLERGY) 10 MG tablet    Sig: Take 1 tablet (10 mg total) by mouth daily.    Dispense:  30 tablet    Refill:  0  . fluticasone (FLONASE) 50 MCG/ACT nasal spray    Sig: Place 1 spray into both nostrils daily for 14 days.      Dispense:  16 g    Refill:  0  . azithromycin (ZITHROMAX) 250 MG tablet    Sig: Take 1 tablet (250 mg total) by mouth daily. Take first 2 tablets together, then 1 every day until finished.    Dispense:  6 tablet    Refill:  0   Discharge instructions Take medication as prescribed and to completion Rest and drink plenty of fluids Use OTC medications as needed for headache  follow up with PCP if symptoms persists Return or go to the ER if you have any new or worsening symptoms such as fever, chills, nausea, vomiting, chest pain, shortness of breath, cough, vision changes, worsening headache despite treatment, slurred speech, facial asymmetry, weakness in arms or legs, etc...  Reviewed expectations re: course of current medical issues. Questions answered. Outlined signs and symptoms indicating need for more acute intervention. Patient verbalized understanding. After Visit Summary given.   Durward Parcel, FNP 02/12/20 0825

## 2020-02-12 NOTE — Discharge Instructions (Addendum)
Take medication as prescribed and to completion Rest and drink plenty of fluids Use OTC medications as needed for headache  follow up with PCP if symptoms persists Return or go to the ER if you have any new or worsening symptoms such as fever, chills, nausea, vomiting, chest pain, shortness of breath, cough, vision changes, worsening headache despite treatment, slurred speech, facial asymmetry, weakness in arms or legs, etc..

## 2020-02-12 NOTE — ED Triage Notes (Signed)
Pt c/o headache since Friday, ibuprofen not helping

## 2020-02-21 ENCOUNTER — Ambulatory Visit: Payer: PRIVATE HEALTH INSURANCE | Admitting: Family Medicine

## 2020-03-10 ENCOUNTER — Ambulatory Visit (INDEPENDENT_AMBULATORY_CARE_PROVIDER_SITE_OTHER): Payer: PRIVATE HEALTH INSURANCE | Admitting: Family Medicine

## 2020-03-10 ENCOUNTER — Other Ambulatory Visit: Payer: Self-pay

## 2020-03-10 ENCOUNTER — Encounter: Payer: Self-pay | Admitting: Family Medicine

## 2020-03-10 VITALS — BP 121/81 | HR 82 | Temp 97.8°F | Ht 70.0 in | Wt 338.0 lb

## 2020-03-10 DIAGNOSIS — M4802 Spinal stenosis, cervical region: Secondary | ICD-10-CM | POA: Diagnosis not present

## 2020-03-10 DIAGNOSIS — E1169 Type 2 diabetes mellitus with other specified complication: Secondary | ICD-10-CM | POA: Insufficient documentation

## 2020-03-10 DIAGNOSIS — E782 Mixed hyperlipidemia: Secondary | ICD-10-CM | POA: Diagnosis not present

## 2020-03-10 DIAGNOSIS — K219 Gastro-esophageal reflux disease without esophagitis: Secondary | ICD-10-CM

## 2020-03-10 DIAGNOSIS — E785 Hyperlipidemia, unspecified: Secondary | ICD-10-CM | POA: Insufficient documentation

## 2020-03-10 DIAGNOSIS — E079 Disorder of thyroid, unspecified: Secondary | ICD-10-CM

## 2020-03-10 DIAGNOSIS — E1165 Type 2 diabetes mellitus with hyperglycemia: Secondary | ICD-10-CM

## 2020-03-10 DIAGNOSIS — Z114 Encounter for screening for human immunodeficiency virus [HIV]: Secondary | ICD-10-CM

## 2020-03-10 DIAGNOSIS — Z1159 Encounter for screening for other viral diseases: Secondary | ICD-10-CM

## 2020-03-10 DIAGNOSIS — Z794 Long term (current) use of insulin: Secondary | ICD-10-CM

## 2020-03-10 LAB — BAYER DCA HB A1C WAIVED: HB A1C (BAYER DCA - WAIVED): 7.1 % — ABNORMAL HIGH (ref <7.0)

## 2020-03-10 MED ORDER — TRULICITY 3 MG/0.5ML ~~LOC~~ SOAJ: 3.0000 mg | SUBCUTANEOUS | 1 refills | Status: DC

## 2020-03-10 NOTE — Patient Instructions (Signed)

## 2020-03-10 NOTE — Progress Notes (Signed)
Assessment & Plan:  1. Type 2 diabetes mellitus with hyperglycemia, with long-term current use of insulin (HCC) Lab Results  Component Value Date   HGBA1C 7.1 (H) 03/10/2020   HGBA1C 7.0 (H) 12/09/2019  - Diabetes is not at goal of A1c < 7. - Medications: continue metformin 500 mg BID and steglatro 15 mg QD. Increase Trulicity from 1.5 to 3 mg once weekly. - Home glucose monitoring: continue monitoring - Patient is currently taking a statin. Patient is taking an ACE-inhibitor/ARB.  - Last foot exam: 03/10/2020 - Last diabetic eye exam: scheduled for September - Urine Microalbumin/Creat Ratio: 03/10/2020 - Instruction/counseling given: reminded to get eye exam, discussed foot care, discussed the need for weight loss, discussed diet and provided printed educational material - Microalbumin / creatinine urine ratio - Bayer DCA Hb A1c Waived - CMP14+EGFR - Dulaglutide (TRULICITY) 3 HB/7.1IR SOPN; Inject 0.5 mLs (3 mg total) as directed once a week.  Dispense: 12 pen; Refill: 1  2. Stenosis of cervical spine - Radiology is transferring imaging to CD so that we can send with referral.  - Ambulatory referral to Neurosurgery  3. Morbid obesity ([place]) - Diet and exercise encouraged. Trulicity increase will help with weight as well.  - Dulaglutide (TRULICITY) 3 CV/8.9FY SOPN; Inject 0.5 mLs (3 mg total) as directed once a week.  Dispense: 12 pen; Refill: 1  4. Mixed hyperlipidemia - Well controlled on current regimen.  - CMP14+EGFR  5. Thyroid disease - Well controlled on current regimen.  - CMP14+EGFR  6. Gastroesophageal reflux disease, unspecified whether esophagitis present - Well controlled on current regimen.  - CMP14+EGFR  7. Encounter for hepatitis C screening test for low risk patient - Hepatitis C antibody  8. Encounter for screening for HIV - HIV Antibody (routine testing w rflx)   Return in about 3 months (around 06/10/2020) for annual physical.  Hendricks Limes, MSN,  APRN, FNP-[facility]  Subjective:    Patient ID: Daniel Scott, male    DOB: Jan 31, 1961, 59 y.o.   MRN: [id number]  Patient Care Team: Loman Brooklyn, FNP as PCP - General (Family Medicine)   Chief Complaint:  Chief Complaint  Patient presents with  . Diabetes    3 month follow up     HPI: Daniel Scott is a 59 y.o. male presenting on 03/10/2020 for Diabetes (3 month follow up )  Diabetes: Patient presents for follow up of diabetes. Current symptoms include: hyperglycemia. Known diabetic complications: none. Medication compliance: yes. Current diet: in general, an "unhealthy" diet. Current exercise: none. Home blood sugar records: BGs are running  consistent with Hgb A1C. Is he  on ACE inhibitor or angiotensin II receptor blocker? Yes. Is he on a statin? Yes.   Lab Results  Component Value Date   HGBA1C 7.1 (H) 03/10/2020   HGBA1C 7.0 (H) 12/09/2019   Lab Results  Component Value Date   LDLCALC 67 12/09/2019   CREATININE 0.80 12/09/2019     New complaints: Patient reports neck pain with a h/o stenosis of the cervical spine. He brought his MRI with him on a flash drive. He is interested in a referral as he knows he is going to need surgical intervention eventually and would like to go ahead and get established. The pain occurs in his neck, scapula, and upper part of the left arm. He does get numbness in the left arm as well. His left arm is often weak. He has had multiple  injections in the neck previously.    Social history:  Relevant past medical, surgical, family and social history reviewed and updated as indicated. Interim medical history since our last visit reviewed.  Allergies and medications reviewed and updated.  DATA REVIEWED: CHART IN EPIC  ROS: Negative unless specifically indicated above in HPI.    Current Outpatient Medications:  .  amLODipine (NORVASC) 10 MG tablet, Take 1 tablet (10 mg total) by mouth daily., Disp: 90  tablet, Rfl: 1 .  aspirin 325 MG tablet, Take 325 mg by mouth daily., Disp: , Rfl:  .  atorvastatin (LIPITOR) 80 MG tablet, Take 1 tablet (80 mg total) by mouth daily., Disp: 90 tablet, Rfl: 1 .  cholecalciferol (VITAMIN D3) 25 MCG (1000 UNIT) tablet, Take 5,000 Units by mouth daily. , Disp: , Rfl:  .  Dulaglutide (TRULICITY) 1.5 PR/9.1MB SOPN, Inject 1.5 mg into the skin once a week., Disp: 12 pen, Rfl: 1 .  Ertugliflozin L-PyroglutamicAc (STEGLATRO) 15 MG TABS, Take 15 mg by mouth daily. , Disp: , Rfl:  .  gabapentin (NEURONTIN) 600 MG tablet, Take 1 tablet (600 mg total) by mouth daily., Disp: 90 tablet, Rfl: 1 .  Krill Oil 1000 MG CAPS, Take by mouth., Disp: , Rfl:  .  levothyroxine (SYNTHROID) 75 MCG tablet, Take 1 tablet (75 mcg total) by mouth daily before breakfast., Disp: 90 tablet, Rfl: 1 .  lisinopril (ZESTRIL) 20 MG tablet, Take 1 tablet (20 mg total) by mouth daily., Disp: 90 tablet, Rfl: 1 .  metFORMIN (GLUCOPHAGE) 500 MG tablet, Take 1 tablet (500 mg total) by mouth 2 (two) times daily with a meal., Disp: 180 tablet, Rfl: 1 .  Multiple Vitamin (MULTIVITAMIN ADULT PO), Take by mouth., Disp: , Rfl:  .  omeprazole (PRILOSEC) 20 MG capsule, Take 20 mg by mouth daily. 1 tablet every other day, Disp: , Rfl:  .  spironolactone (ALDACTONE) 25 MG tablet, Take 1 tablet (25 mg total) by mouth daily., Disp: 90 tablet, Rfl: 1 .  vitamin B-12 (CYANOCOBALAMIN) 500 MCG tablet, Take 5,000 mcg by mouth daily. , Disp: , Rfl:    Allergies  Allergen Reactions  . Sulfa Antibiotics    Past Medical History:  Diagnosis Date  . Depression   . Diabetes mellitus without complication ([place])   . GERD (gastroesophageal reflux disease)   . Hiatal hernia   . Hyperlipidemia   . Hypertension   . Stenosis of cervical spine   . Thyroid disease     Past Surgical History:  Procedure Laterality Date  . HYDROCELE EXCISION    . LAPAROSCOPIC GASTRIC BANDING      Social History   Socioeconomic History  .  Marital status: Married    Spouse name: Not on file  . Number of children: Not on file  . Years of education: Not on file  . Highest education level: Not on file  Occupational History  . Not on file  Tobacco Use  . Smoking status: Former Smoker    Types: Cigarettes    Quit date: 05/2009    Years since quitting: 10.8  . Smokeless tobacco: Never Used  Vaping Use  . Vaping Use: Never used  Substance and Sexual Activity  . Alcohol use: Yes    Comment: moderately. 12/09/19- Patient states he has 12 drinks a week.  . Drug use: Yes    Types: Marijuana    Comment: occ  . Sexual activity: Yes    Birth control/protection: None  Other Topics Concern  .  Not on file  Social History Narrative  . Not on file   Social Determinants of Health   Financial Resource Strain:   . Difficulty of Paying Living Expenses:   Food Insecurity:   . Worried About Charity fundraiser in the Last Year:   . Arboriculturist in the Last Year:   Transportation Needs:   . Film/video editor (Medical):   Marland Kitchen Lack of Transportation (Non-Medical):   Physical Activity:   . Days of Exercise per Week:   . Minutes of Exercise per Session:   Stress:   . Feeling of Stress :   Social Connections:   . Frequency of Communication with Friends and Family:   . Frequency of Social Gatherings with Friends and Family:   . Attends Religious Services:   . Active Member of Clubs or Organizations:   . Attends Archivist Meetings:   Marland Kitchen Marital Status:   Intimate Partner Violence:   . Fear of Current or Ex-Partner:   . Emotionally Abused:   Marland Kitchen Physically Abused:   . Sexually Abused:         Objective:    BP 121/81   Pulse 82   Temp 97.8 F (36.6 C) (Temporal)   Ht 5' 10"  (1.778 m)   Wt (!) 338 lb (153.3 kg)   SpO2 98%   BMI 48.50 kg/m   Wt Readings from Last 3 Encounters:  03/10/20 (!) 338 lb (153.3 kg)  12/09/19 (!) 342 lb (155.1 kg)    Physical Exam Vitals reviewed.  Constitutional:       General: He is not in acute distress.    Appearance: Normal appearance. He is morbidly obese. He is not ill-appearing, toxic-appearing or diaphoretic.  HENT:     Head: Normocephalic and atraumatic.  Eyes:     General: No scleral icterus.       Right eye: No discharge.        Left eye: No discharge.     Conjunctiva/sclera: Conjunctivae normal.  Cardiovascular:     Rate and Rhythm: Normal rate and regular rhythm.     Heart sounds: Normal heart sounds. No murmur heard.  No friction rub. No gallop.   Pulmonary:     Effort: Pulmonary effort is normal. No respiratory distress.     Breath sounds: Normal breath sounds. No stridor. No wheezing, rhonchi or rales.  Musculoskeletal:        General: Normal range of motion.     Cervical back: Normal range of motion.  Skin:    General: Skin is warm and dry.  Neurological:     Mental Status: He is alert and oriented to person, place, and time. Mental status is at baseline.  Psychiatric:        Mood and Affect: Mood normal.        Behavior: Behavior normal.        Thought Content: Thought content normal.        Judgment: Judgment normal.    Diabetic Foot Exam - Simple   Simple Foot Form Diabetic Foot exam was performed with the following findings: Yes 03/10/2020  9:06 AM  Visual Inspection No deformities, no ulcerations, no other skin breakdown bilaterally: Yes Sensation Testing Intact to touch and monofilament testing bilaterally: Yes Pulse Check Posterior Tibialis and Dorsalis pulse intact bilaterally: Yes Comments     Lab Results  Component Value Date   TSH 2.850 12/09/2019   Lab Results  Component Value Date  WBC 5.9 12/09/2019   HGB 13.8 12/09/2019   HCT 41.5 12/09/2019   MCV 97 12/09/2019   PLT 238 12/09/2019   Lab Results  Component Value Date   NA 140 12/09/2019   K 4.9 12/09/2019   CO2 23 12/09/2019   GLUCOSE 113 (H) 12/09/2019   BUN 16 12/09/2019   CREATININE 0.80 12/09/2019   BILITOT 0.3 12/09/2019   ALKPHOS  79 12/09/2019   AST 25 12/09/2019   ALT 38 12/09/2019   PROT 6.3 12/09/2019   ALBUMIN 4.2 12/09/2019   CALCIUM 9.6 12/09/2019   Lab Results  Component Value Date   CHOL 136 12/09/2019   Lab Results  Component Value Date   HDL 45 12/09/2019   Lab Results  Component Value Date   LDLCALC 67 12/09/2019   Lab Results  Component Value Date   TRIG 140 12/09/2019   Lab Results  Component Value Date   CHOLHDL 3.0 12/09/2019   Lab Results  Component Value Date   HGBA1C 7.1 (H) 03/10/2020

## 2020-03-11 ENCOUNTER — Encounter: Payer: Self-pay | Admitting: Family Medicine

## 2020-03-11 LAB — CMP14+EGFR
ALT: 38 IU/L (ref 0–44)
AST: 25 IU/L (ref 0–40)
Albumin/Globulin Ratio: 2 (ref 1.2–2.2)
Albumin: 4.3 g/dL (ref 3.8–4.9)
Alkaline Phosphatase: 74 IU/L (ref 48–121)
BUN/Creatinine Ratio: 18 (ref 10–24)
BUN: 13 mg/dL (ref 8–27)
Bilirubin Total: 0.4 mg/dL (ref 0.0–1.2)
CO2: 22 mmol/L (ref 20–29)
Calcium: 9.7 mg/dL (ref 8.6–10.2)
Chloride: 100 mmol/L (ref 96–106)
Creatinine, Ser: 0.71 mg/dL — ABNORMAL LOW (ref 0.76–1.27)
GFR calc Af Amer: 118 mL/min/{1.73_m2} (ref >59)
GFR calc non Af Amer: 102 mL/min/{1.73_m2} (ref >59)
Globulin, Total: 2.1 g/dL (ref 1.5–4.5)
Glucose: 121 mg/dL — ABNORMAL HIGH (ref 65–99)
Potassium: 4.8 mmol/L (ref 3.5–5.2)
Sodium: 140 mmol/L (ref 134–144)
Total Protein: 6.4 g/dL (ref 6.0–8.5)

## 2020-03-11 LAB — MICROALBUMIN / CREATININE URINE RATIO
Creatinine, Urine: 64.7 mg/dL
Microalb/Creat Ratio: <5 mg/g creat (ref 0–29)
Microalbumin, Urine: <3 ug/mL

## 2020-03-11 LAB — HEPATITIS C ANTIBODY: Hep C Virus Ab: <0.1 s/co ratio (ref 0.0–0.9)

## 2020-03-11 LAB — HIV ANTIBODY (ROUTINE TESTING W REFLEX): HIV Screen 4th Generation wRfx: NONREACTIVE

## 2020-03-19 ENCOUNTER — Other Ambulatory Visit: Payer: Self-pay | Admitting: Neurological Surgery

## 2020-03-19 ENCOUNTER — Other Ambulatory Visit (HOSPITAL_COMMUNITY): Payer: Self-pay | Admitting: Neurological Surgery

## 2020-03-19 DIAGNOSIS — M5412 Radiculopathy, cervical region: Secondary | ICD-10-CM

## 2020-04-01 ENCOUNTER — Ambulatory Visit (HOSPITAL_COMMUNITY)
Admission: RE | Admit: 2020-04-01 | Discharge: 2020-04-01 | Disposition: A | Discharge status: Home / Self Care | Payer: PRIVATE HEALTH INSURANCE | Source: Ambulatory Visit | Attending: Neurological Surgery | Admitting: Neurological Surgery

## 2020-04-01 ENCOUNTER — Other Ambulatory Visit: Payer: Self-pay

## 2020-04-01 DIAGNOSIS — M5412 Radiculopathy, cervical region: Secondary | ICD-10-CM | POA: Insufficient documentation

## 2020-04-01 IMAGING — MR MR CERVICAL SPINE W/O CM
7 of 8 series · 30 of 48 positions shown · non-contrast
Comparison: None.

CLINICAL DATA: Pain

EXAM:
MRI CERVICAL SPINE WITHOUT CONTRAST
TECHNIQUE: Multiplanar, multisequence MR imaging of the cervical spine was
performed. No intravenous contrast was administered.

[Series 5: T1 · sagittal · 3.0mm · 0.69mm/px · 3 of 16 slices shown (1 of 2)]
[im 1/16]
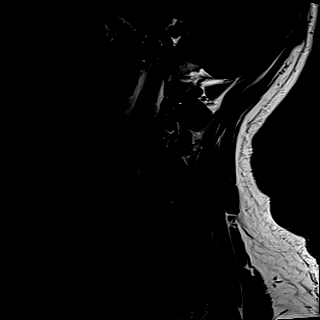
[im 8/16]
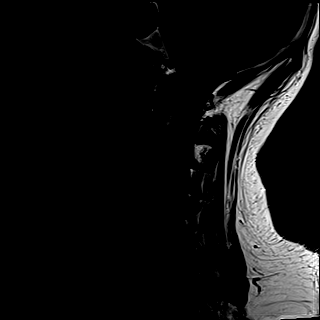
[im 16/16]
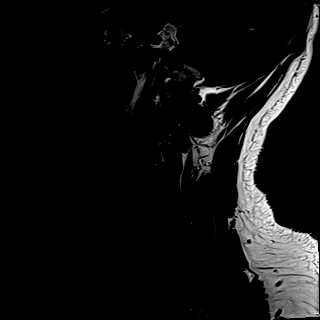

[Series 6: T2 · sagittal · 3.0mm · 0.69mm/px · 3 of 16 slices shown (1 of 2)]
[im 1/16]
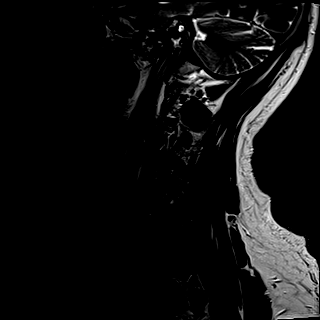
[im 8/16]
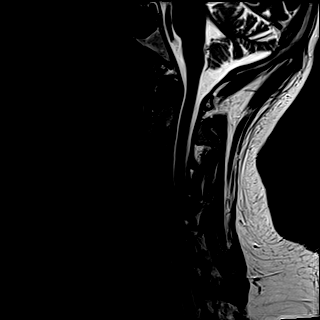
[im 16/16]
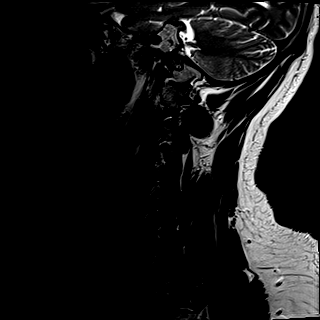

[Series 7: STIR · sagittal · 3.0mm · 0.86mm/px · 3 of 16 slices shown (1 of 2)]
[im 1/16]
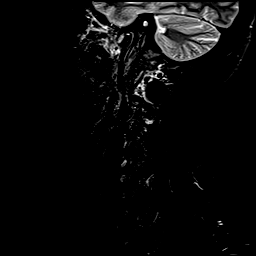
[im 8/16]
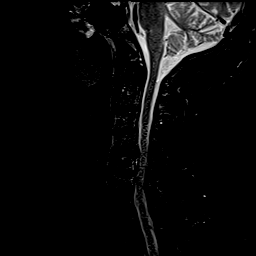
[im 16/16]
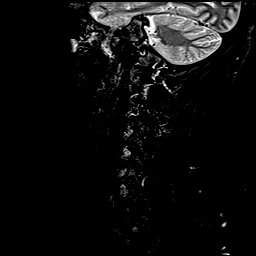

[Series 8: T2 · axial · 3.0mm · 0.70mm/px · z∈[-79,+29]mm · 6 of 32 slices shown (2 of 2)]
[im 1/32]
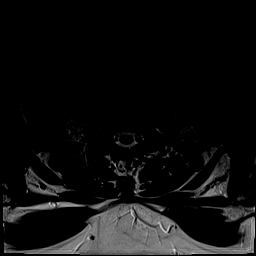
[im 7/32]
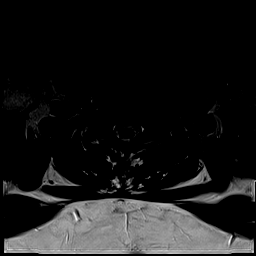
[im 13/32]
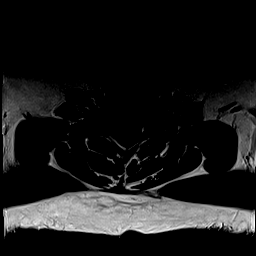
[im 19/32]
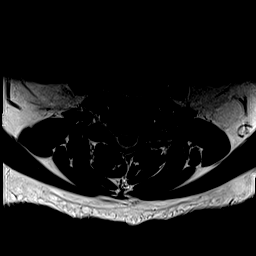
[im 25/32]
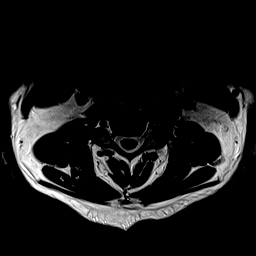
[im 32/32]
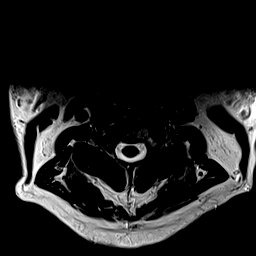

[Series 9: GRE · axial · 3.0mm · 0.35mm/px · z∈[-79,+29]mm · 6 of 32 slices shown]
[im 1/32]
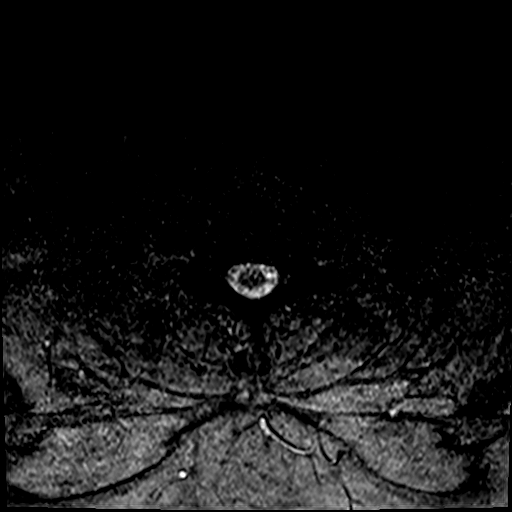
[im 7/32]
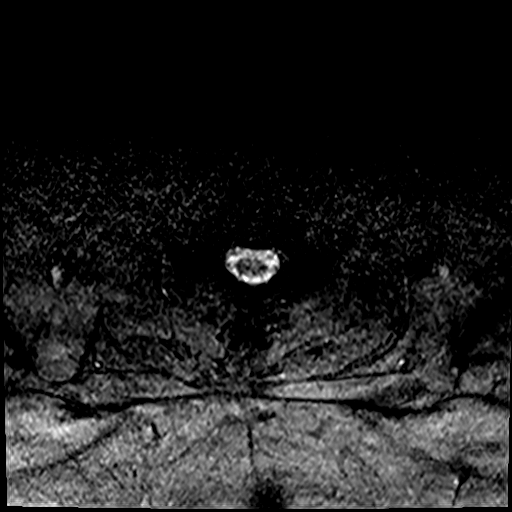
[im 13/32]
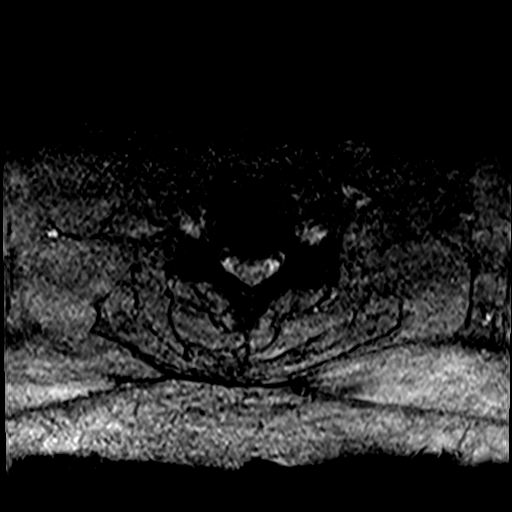
[im 19/32]
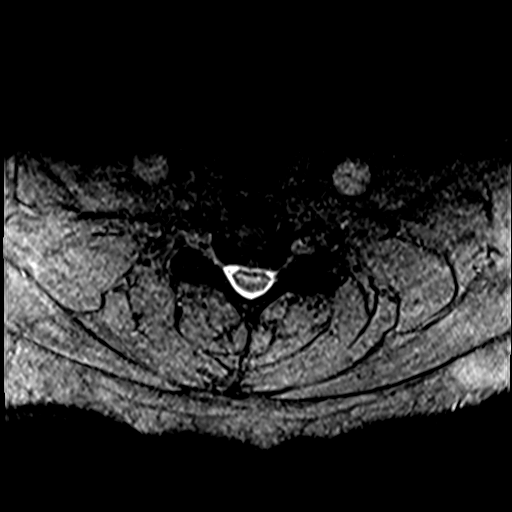
[im 25/32]
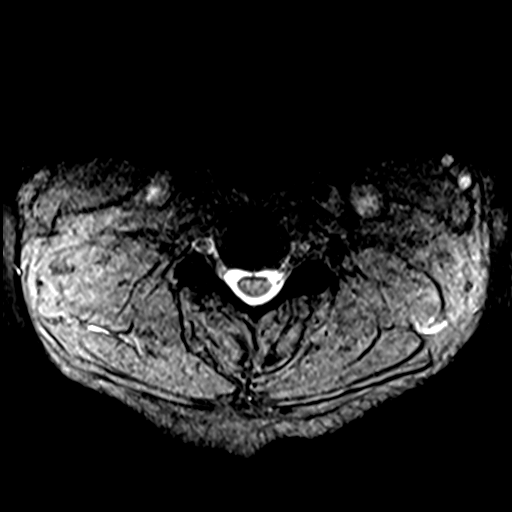
[im 32/32]
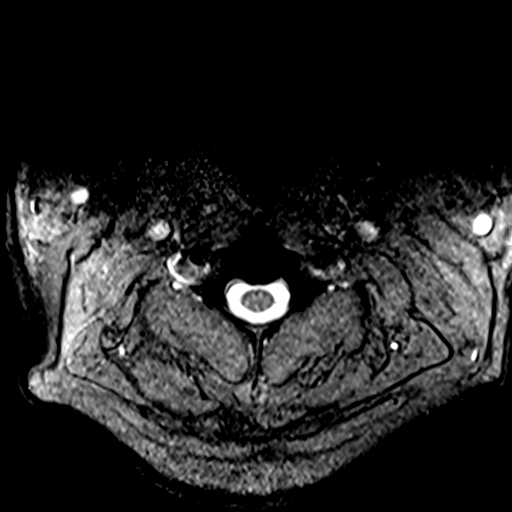

[Series 10: T1 · axial · 3.0mm · 0.35mm/px · z∈[-79,+29]mm · 6 of 32 slices shown (2 of 2)]
[im 1/32]
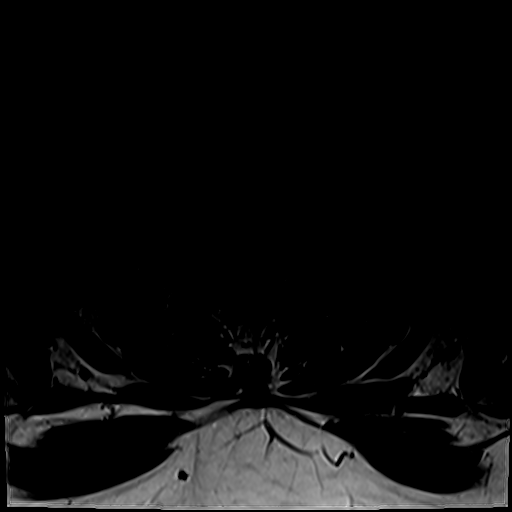
[im 7/32]
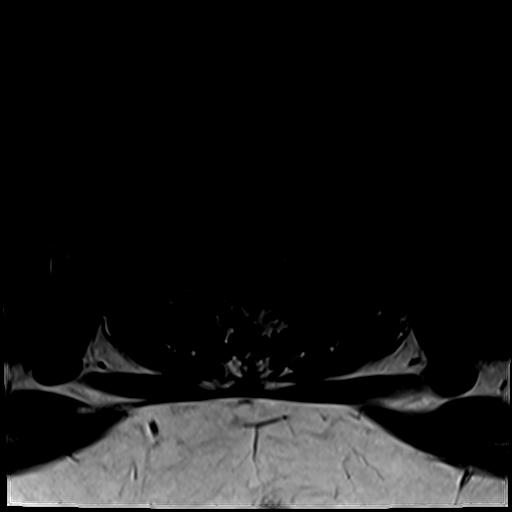
[im 13/32]
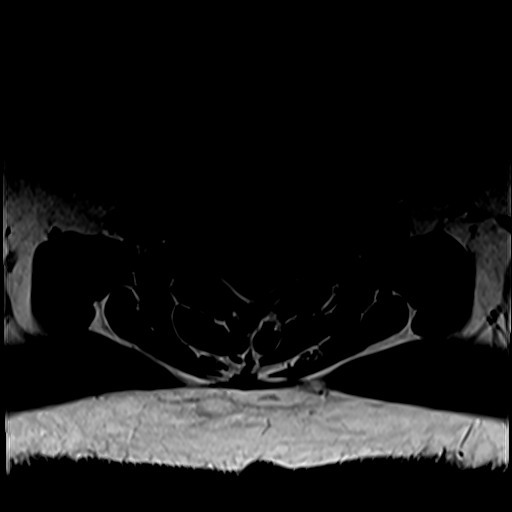
[im 19/32]
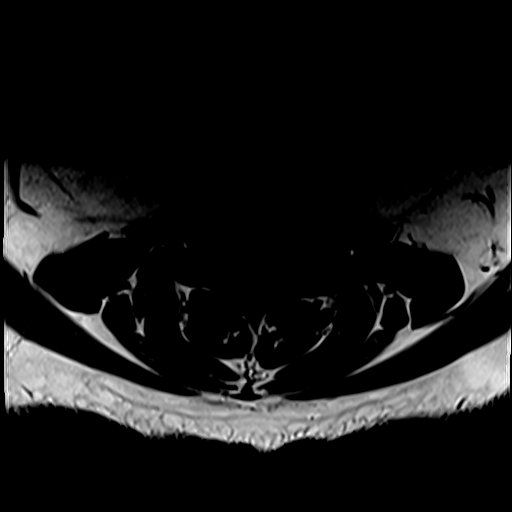
[im 25/32]
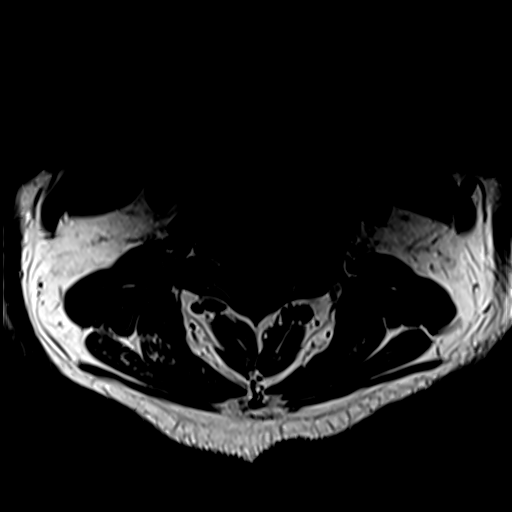
[im 32/32]
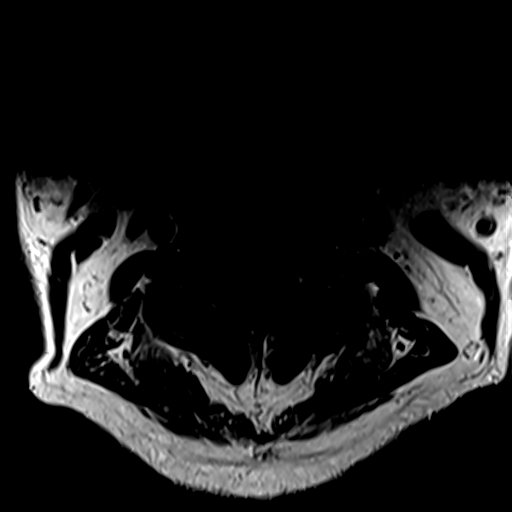

[Series 11: STIR · sagittal · 3.0mm · 0.86mm/px · 3 of 16 slices shown (2 of 2)]
[im 1/16]
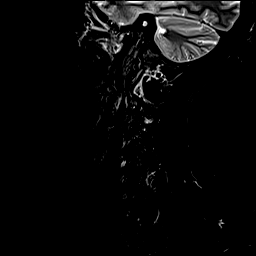
[im 8/16]
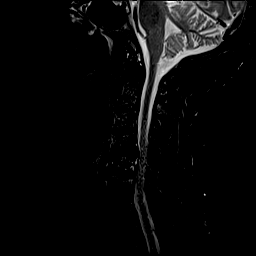
[im 16/16]
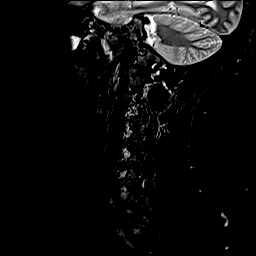

[30 of 48 positions shown; findings below may reference images not displayed]

FINDINGS: Alignment: Straightening of cervical lordosis. Grade 1 C5-6
retrolisthesis. Grade 1 C4-5 anterolisthesis.

Vertebrae: Normal bone marrow signal intensity. No focal osseous
lesion. Congenital spinal canal narrowing.

Cord: Normal signal and morphology.

Posterior Fossa, vertebral arteries: Negative.

Disc levels: Multilevel desiccation. Severe C5-6 disc space loss.
Remaining disc spaces are preserved.

C2-3: No significant disc bulge, spinal canal or neural foraminal
narrowing.

C3-4: Small central protrusion with uncovertebral and bilateral
facet degenerative spurring. No significant spinal canal or neural
foraminal narrowing.

C4-5: Disc osteophyte complex with superimposed central and left
subarticular protrusions, uncovertebral and bilateral facet
hypertrophy. Mild spinal canal and bilateral neural foraminal
narrowing.

C5-6: Disc osteophyte complex abutting the ventral cord with
uncovertebral and bilateral facet hypertrophy. Mild-to-moderate
spinal canal, severe right and moderate left neural foraminal
narrowing.

C6-7: Disc osteophyte complex with superimposed central protrusion,
uncovertebral and bilateral facet degenerative spurring. Mild spinal
canal and bilateral neural foraminal narrowing.

C7-T1: No significant disc bulge, spinal canal or neural foraminal
narrowing.

Paraspinal tissues: Within normal limits.
IMPRESSION: Congenital spinal canal narrowing with superimposed spondylosis,
most prominent at the C5-6 level.

Mild to moderate spinal canal, severe right and moderate left neural
foraminal narrowing at the C5-6 level.

Mild spinal canal and bilateral neural foraminal narrowing at the
C4-5 and C6-7 levels.

## 2020-04-06 ENCOUNTER — Other Ambulatory Visit: Payer: Self-pay | Admitting: *Deleted

## 2020-04-06 ENCOUNTER — Encounter: Payer: Self-pay | Admitting: Family Medicine

## 2020-04-06 MED ORDER — STEGLATRO 15 MG PO TABS: 15.0000 mg | ORAL_TABLET | Freq: Every day | ORAL | 3 refills | Status: DC

## 2020-04-22 ENCOUNTER — Other Ambulatory Visit: Payer: Self-pay | Admitting: Neurological Surgery

## 2020-05-04 ENCOUNTER — Other Ambulatory Visit: Payer: Self-pay | Admitting: Neurological Surgery

## 2020-05-08 ENCOUNTER — Other Ambulatory Visit: Payer: Self-pay | Admitting: Family Medicine

## 2020-05-08 DIAGNOSIS — Z794 Long term (current) use of insulin: Secondary | ICD-10-CM

## 2020-05-25 ENCOUNTER — Other Ambulatory Visit: Payer: Self-pay | Admitting: Neurological Surgery

## 2020-05-28 ENCOUNTER — Encounter: Payer: Self-pay | Admitting: Family Medicine

## 2020-05-28 LAB — HM DIABETES EYE EXAM

## 2020-05-28 NOTE — Progress Notes (Signed)
[id number] - [place], [place] - [address] AT [facility] [address]  [place] [address] Phone: [phone] Fax: [phone]      Your procedure is scheduled on Tuesday 06/02/2020.  Report to [facility] Main Entrance "A" at 05:30 A.M., and check in at the Admitting office.  Call this number if you have problems the morning of surgery:  [phone]  Call [phone] if you have any questions prior to your surgery date Monday-Friday 8am-4pm    Remember:  Do not eat or drink after midnight the night before your surgery     Take these medicines the morning of surgery with A SIP OF WATER: Amlodipine (Norvasc) Levothyroxine (Synthroid)   As of today, STOP taking any Aspirin (unless otherwise instructed by your surgeon) Aleve, Naproxen, Ibuprofen, Motrin, Advil, Goody's, BC's, all herbal medications, fish oil, and all vitamins.  Follow your surgeon's instructions on when to stop Aspirin.  If no instructions were given by your surgeon then you will need to call the office to get those instructions.     WHAT DO I DO ABOUT MY DIABETES MEDICATION?   . DO NOT take Ertugliflozin L -PyroglutamicAc (Steglatro) the day before surgery and do not take the morning of surgery  . DO NOT take Metformin (Glucophage) the morning of surgery.  . The day of surgery, do not take other diabetes injectables, including Byetta (exenatide), Bydureon (exenatide ER), Victoza (liraglutide), or Trulicity (dulaglutide).    HOW TO MANAGE YOUR DIABETES BEFORE AND AFTER SURGERY  Why is it important to control my blood sugar before and after surgery? . Improving blood sugar levels before and after surgery helps healing and can limit problems. . A way of improving blood sugar control is eating a healthy diet by: o  Eating less sugar and carbohydrates o  Increasing activity/exercise o  Talking with your doctor about reaching your blood sugar goals . High blood sugars (greater than 180  mg/dL) can raise your risk of infections and slow your recovery, so you will need to focus on controlling your diabetes during the weeks before surgery. . Make sure that the doctor who takes care of your diabetes knows about your planned surgery including the date and location.  How do I manage my blood sugar before surgery? . Check your blood sugar at least 4 times a day, starting 2 days before surgery, to make sure that the level is not too high or low. . Check your blood sugar the morning of your surgery when you wake up and every 2 hours until you get to the Short Stay unit. o If your blood sugar is less than 70 mg/dL, you will need to treat for low blood sugar: - Do not take insulin. - Treat a low blood sugar (less than 70 mg/dL) with  cup of clear juice (cranberry or apple), 4 glucose tablets, OR glucose gel. - Recheck blood sugar in 15 minutes after treatment (to make sure it is greater than 70 mg/dL). If your blood sugar is not greater than 70 mg/dL on recheck, call [phone] for further instructions. . Report your blood sugar to the short stay nurse when you get to Short Stay.  . If you are admitted to the hospital after surgery: o Your blood sugar will be checked by the staff and you will probably be given insulin after surgery (instead of oral diabetes medicines) to make sure you have good blood sugar levels. o The goal for blood sugar control after surgery is  80-180 mg/dL.                      Do not wear jewelry            Do not wear lotions, powders, colognes, or deodorant.            Men may shave face and neck.            Do not bring valuables to the hospital.            [facility] is not responsible for any belongings or valuables.  Do NOT Smoke (Tobacco/Vaping) or drink Alcohol 24 hours prior to your procedure  If you use a CPAP at night, you may bring all equipment for your overnight stay.   Contacts, glasses, dentures or bridgework may not be worn into surgery.       For patients admitted to the hospital, discharge time will be determined by your treatment team.   Patients discharged the day of surgery will not be allowed to drive home, and someone needs to stay with them for 24 hours.    Special instructions:   [place]- Preparing For Surgery  Before surgery, you can play an important role. Because skin is not sterile, your skin needs to be as free of germs as possible. You can reduce the number of germs on your skin by washing with CHG (chlorahexidine gluconate) Soap before surgery.  CHG is an antiseptic cleaner which kills germs and bonds with the skin to continue killing germs even after washing.    Oral Hygiene is also important to reduce your risk of infection.  Remember - BRUSH YOUR TEETH THE MORNING OF SURGERY WITH YOUR REGULAR TOOTHPASTE  Please do not use if you have an allergy to CHG or antibacterial soaps. If your skin becomes reddened/irritated stop using the CHG.  Do not shave (including legs and underarms) for at least 48 hours prior to first CHG shower. It is OK to shave your face.  Please follow these instructions carefully.   1. Shower the NIGHT BEFORE SURGERY and the MORNING OF SURGERY with CHG Soap.   2. If you chose to wash your hair, wash your hair first as usual with your normal shampoo.  3. After you shampoo, rinse your hair and body thoroughly to remove the shampoo.  4. Use CHG as you would any other liquid soap. You can apply CHG directly to the skin and wash gently with a scrungie or a clean washcloth.   5. Apply the CHG Soap to your body ONLY FROM THE NECK DOWN.  Do not use on open wounds or open sores. Avoid contact with your eyes, ears, mouth and genitals (private parts). Wash Face and genitals (private parts)  with your normal soap.   6. Wash thoroughly, paying special attention to the area where your surgery will be performed.  7. Thoroughly rinse your body with warm water from the neck down.  8. DO NOT  shower/wash with your normal soap after using and rinsing off the CHG Soap.  9. Pat yourself dry with a CLEAN TOWEL.  10. Wear CLEAN PAJAMAS to bed the night before surgery  11. Place CLEAN SHEETS on your bed the night of your first shower and DO NOT SLEEP WITH PETS.   Day of Surgery: Wear Clean/Comfortable clothing the morning of surgery Do not apply any deodorants/lotions.   Remember to brush your teeth WITH YOUR REGULAR TOOTHPASTE.   Please read over  the following fact sheets that you were given.

## 2020-05-29 ENCOUNTER — Other Ambulatory Visit (HOSPITAL_COMMUNITY)
Admission: RE | Admit: 2020-05-29 | Discharge: 2020-05-29 | Disposition: A | Discharge status: Home / Self Care | Payer: PRIVATE HEALTH INSURANCE | Source: Ambulatory Visit | Attending: Neurological Surgery | Admitting: Neurological Surgery

## 2020-05-29 ENCOUNTER — Other Ambulatory Visit: Payer: Self-pay

## 2020-05-29 ENCOUNTER — Encounter (HOSPITAL_COMMUNITY)
Admission: RE | Admit: 2020-05-29 | Discharge: 2020-05-29 | Disposition: A | Discharge status: Home / Self Care | Payer: PRIVATE HEALTH INSURANCE | Source: Ambulatory Visit | Attending: Neurological Surgery | Admitting: Neurological Surgery

## 2020-05-29 ENCOUNTER — Encounter (HOSPITAL_COMMUNITY): Payer: Self-pay

## 2020-05-29 DIAGNOSIS — I440 Atrioventricular block, first degree: Secondary | ICD-10-CM | POA: Insufficient documentation

## 2020-05-29 DIAGNOSIS — Z20822 Contact with and (suspected) exposure to covid-19: Secondary | ICD-10-CM | POA: Insufficient documentation

## 2020-05-29 DIAGNOSIS — Z01818 Encounter for other preprocedural examination: Secondary | ICD-10-CM | POA: Insufficient documentation

## 2020-05-29 DIAGNOSIS — I252 Old myocardial infarction: Secondary | ICD-10-CM | POA: Insufficient documentation

## 2020-05-29 DIAGNOSIS — Z01812 Encounter for preprocedural laboratory examination: Secondary | ICD-10-CM | POA: Insufficient documentation

## 2020-05-29 HISTORY — DX: Fatty (change of) liver, not elsewhere classified: K76.0

## 2020-05-29 HISTORY — DX: Hypothyroidism, unspecified: E03.9

## 2020-05-29 HISTORY — DX: Myoneural disorder, unspecified: G70.9

## 2020-05-29 HISTORY — DX: Unspecified osteoarthritis, unspecified site: M19.90

## 2020-05-29 LAB — COMPREHENSIVE METABOLIC PANEL
ALT: 65 U/L — ABNORMAL HIGH (ref 0–44)
AST: 51 U/L — ABNORMAL HIGH (ref 15–41)
Albumin: 4.1 g/dL (ref 3.5–5.0)
Alkaline Phosphatase: 61 U/L (ref 38–126)
Anion gap: 11 (ref 5–15)
BUN: 13 mg/dL (ref 6–20)
CO2: 23 mmol/L (ref 22–32)
Calcium: 9.6 mg/dL (ref 8.9–10.3)
Chloride: 102 mmol/L (ref 98–111)
Creatinine, Ser: 0.84 mg/dL (ref 0.61–1.24)
GFR calc Af Amer: >60 mL/min (ref >60)
GFR calc non Af Amer: >60 mL/min (ref >60)
Glucose, Bld: 137 mg/dL — ABNORMAL HIGH (ref 70–99)
Potassium: 4.6 mmol/L (ref 3.5–5.1)
Sodium: 136 mmol/L (ref 135–145)
Total Bilirubin: 0.7 mg/dL (ref 0.3–1.2)
Total Protein: 6.6 g/dL (ref 6.5–8.1)

## 2020-05-29 LAB — SURGICAL PCR SCREEN
MRSA, PCR: POSITIVE — AB
Staphylococcus aureus: POSITIVE — AB

## 2020-05-29 LAB — HEMOGLOBIN A1C
Hgb A1c MFr Bld: 6.8 % — ABNORMAL HIGH (ref 4.8–5.6)
Mean Plasma Glucose: 148.46 mg/dL

## 2020-05-29 LAB — CBC
HCT: 45.1 % (ref 39.0–52.0)
Hemoglobin: 15.1 g/dL (ref 13.0–17.0)
MCH: 31.8 pg (ref 26.0–34.0)
MCHC: 33.5 g/dL (ref 30.0–36.0)
MCV: 94.9 fL (ref 80.0–100.0)
Platelets: 259 10*3/uL (ref 150–400)
RBC: 4.75 MIL/uL (ref 4.22–5.81)
RDW: 12.9 % (ref 11.5–15.5)
WBC: 7.9 10*3/uL (ref 4.0–10.5)
nRBC: 0 % (ref 0.0–0.2)

## 2020-05-29 LAB — TYPE AND SCREEN
ABO/RH(D): A POS
Antibody Screen: NEGATIVE

## 2020-05-29 LAB — GLUCOSE, CAPILLARY: Glucose-Capillary: 161 mg/dL — ABNORMAL HIGH (ref 70–99)

## 2020-05-29 LAB — SARS CORONAVIRUS 2 (TAT 6-24 HRS): SARS Coronavirus 2: NEGATIVE

## 2020-05-29 NOTE — Progress Notes (Signed)
PCP - Gwenlyn Fudge, FNP Cardiologist - Denies  PPM/ICD - Denies  Chest x-ray - N/A EKG - 05/29/20 Stress Test - Per patient, in 2014 or 2015 in [place], cannot remember where, but results were unremarkable. ECHO - Per patient, in 2006 at [facility]. Cardiac Cath -   Sleep Study - Denies  Fasting Blood Sugar 100-120s Checks Blood Sugar 2-3 times a week. CBG at PAT appointment was 161. A1C obtained.  Blood Thinner Instructions: N/A Aspirin Instructions: Per patient, last dose 05/23/20  ERAS Protcol - N/A PRE-SURGERY Ensure or G2- N/A  COVID TEST- 05/29/20   Anesthesia review: Yes, Cardiac hx; ECHO and Cardiac cath results requested.  Patient denies shortness of breath, fever, cough and chest pain at PAT appointment   All instructions explained to the patient, with a verbal understanding of the material. Patient agrees to go over the instructions while at home for a better understanding. Patient also instructed to self quarantine after being tested for COVID-19. The opportunity to ask questions was provided.

## 2020-05-29 NOTE — Progress Notes (Signed)
[id number] - [place], [place] - [address] AT [facility] CENTER [address] [place]  [address] Phone: [phone] Fax: [phone]      Your procedure is scheduled on Tuesday 06/02/2020.  Report to [facility] Main Entrance "A" at 05:30 A.M., and check in at the Admitting office.  Call this number if you have problems the morning of surgery:  [phone]  Call [phone] if you have any questions prior to your surgery date Monday-Friday 8am-4pm    Remember:  Do not eat or drink after midnight the night before your surgery     Take these medicines the morning of surgery with A SIP OF WATER: Amlodipine (Norvasc) Levothyroxine (Synthroid) OLOPATADINE HCL eye drops - if needed  *Follow your surgeon's instructions on when to stop Aspirin.  If no instructions were given by your surgeon then you will need to call the office to get those instructions.    As of today, STOP taking any Aleve, Naproxen, Ibuprofen, Motrin, Advil, Goody's, BC's, all herbal medications, fish oil, and all vitamins.     WHAT DO I DO ABOUT MY DIABETES MEDICATION?   . DO NOT take Ertugliflozin L -PyroglutamicAc (Steglatro) the day before surgery and do not take the morning of surgery  . DO NOT take Metformin (Glucophage) the morning of surgery.  . The day of surgery, do not take other diabetes injectables, including Trulicity (dulaglutide).    HOW TO MANAGE YOUR DIABETES BEFORE AND AFTER SURGERY  Why is it important to control my blood sugar before and after surgery? . Improving blood sugar levels before and after surgery helps healing and can limit problems. . A way of improving blood sugar control is eating a healthy diet by: o  Eating less sugar and carbohydrates o  Increasing activity/exercise o  Talking with your doctor about reaching your blood sugar goals . High blood sugars (greater than 180 mg/dL) can raise your risk of infections and slow your recovery, so you will need  to focus on controlling your diabetes during the weeks before surgery. . Make sure that the doctor who takes care of your diabetes knows about your planned surgery including the date and location.  How do I manage my blood sugar before surgery? . Check your blood sugar at least 4 times a day, starting 2 days before surgery, to make sure that the level is not too high or low. . Check your blood sugar the morning of your surgery when you wake up and every 2 hours until you get to the Short Stay unit. o If your blood sugar is less than 70 mg/dL, you will need to treat for low blood sugar: - Do not take insulin. - Treat a low blood sugar (less than 70 mg/dL) with  cup of clear juice (cranberry or apple), 4 glucose tablets, OR glucose gel. - Recheck blood sugar in 15 minutes after treatment (to make sure it is greater than 70 mg/dL). If your blood sugar is not greater than 70 mg/dL on recheck, call [phone] for further instructions. . Report your blood sugar to the short stay nurse when you get to Short Stay.  . If you are admitted to the hospital after surgery: o Your blood sugar will be checked by the staff and you will probably be given insulin after surgery (instead of oral diabetes medicines) to make sure you have good blood sugar levels. o The goal for blood sugar control after surgery is 80-180 mg/dL.   The Morning of  Surgery:                   Do not wear jewelry            Do not wear lotions, powders, colognes, or deodorant.            Men may shave face and neck.            Do not bring valuables to the hospital.            [facility] is not responsible for any belongings or valuables.  Do NOT Smoke (Tobacco/Vaping) or drink Alcohol 24 hours prior to your procedure.  If you use a CPAP at night, you may bring all equipment for your overnight stay.   Contacts, glasses, dentures or bridgework may not be worn into surgery.      For patients admitted to the hospital, discharge  time will be determined by your treatment team.   Patients discharged the day of surgery will not be allowed to drive home, and someone needs to stay with them for 24 hours.    Special instructions:   [place]- Preparing For Surgery  Before surgery, you can play an important role. Because skin is not sterile, your skin needs to be as free of germs as possible. You can reduce the number of germs on your skin by washing with CHG (chlorahexidine gluconate) Soap before surgery.  CHG is an antiseptic cleaner which kills germs and bonds with the skin to continue killing germs even after washing.    Oral Hygiene is also important to reduce your risk of infection.  Remember - BRUSH YOUR TEETH THE MORNING OF SURGERY WITH YOUR REGULAR TOOTHPASTE  Please do not use if you have an allergy to CHG or antibacterial soaps. If your skin becomes reddened/irritated stop using the CHG.  Do not shave (including legs and underarms) for at least 48 hours prior to first CHG shower. It is OK to shave your face.  Please follow these instructions carefully.   1. Shower the NIGHT BEFORE SURGERY and the MORNING OF SURGERY with CHG Soap.   2. If you chose to wash your hair, wash your hair first as usual with your normal shampoo.  3. After you shampoo, rinse your hair and body thoroughly to remove the shampoo.  4. Use CHG as you would any other liquid soap. You can apply CHG directly to the skin and wash gently with a scrungie or a clean washcloth.   5. Apply the CHG Soap to your body ONLY FROM THE NECK DOWN.  Do not use on open wounds or open sores. Avoid contact with your eyes, ears, mouth and genitals (private parts). Wash Face and genitals (private parts)  with your normal soap.   6. Wash thoroughly, paying special attention to the area where your surgery will be performed.  7. Thoroughly rinse your body with warm water from the neck down.  8. DO NOT shower/wash with your normal soap after using and rinsing  off the CHG Soap.  9. Pat yourself dry with a CLEAN TOWEL.  10. Wear CLEAN PAJAMAS to bed the night before surgery  11. Place CLEAN SHEETS on your bed the night of your first shower and DO NOT SLEEP WITH PETS.   Day of Surgery: SHOWER Wear Clean/Comfortable clothing the morning of surgery Do not apply any deodorants/lotions.   Remember to brush your teeth WITH YOUR REGULAR TOOTHPASTE.   Please read over the following fact  sheets that you were given.

## 2020-05-29 NOTE — Progress Notes (Signed)
Received Call from [facility] Medical records stating that Daniel Scott has not been a patient at that facility since 2013. Further, medical records prior to 2013 are no longer available in the system.

## 2020-05-29 NOTE — Progress Notes (Signed)
°   05/29/20 0949  OBSTRUCTIVE SLEEP APNEA  Have you ever been diagnosed with sleep apnea through a sleep study? No  Do you snore loudly (loud enough to be heard through closed doors)?  0  Do you often feel tired, fatigued, or sleepy during the daytime (such as falling asleep during driving or talking to someone)? 0  Has anyone observed you stop breathing during your sleep? 0  Do you have, or are you being treated for high blood pressure? 1  BMI more than 35 kg/m2? 1  Age > 50 (1-yes) 1  Neck circumference greater than:Male 16 inches or larger, Male 17inches or larger? 1  Male Gender (Yes=1) 1  Obstructive Sleep Apnea Score 5

## 2020-05-29 NOTE — Progress Notes (Signed)
PT/INR to be collected DOS. Not collected at PAT appointment as order did not cross over to main lab.

## 2020-05-30 ENCOUNTER — Encounter: Payer: Self-pay | Admitting: Family Medicine

## 2020-06-01 MED ORDER — VANCOMYCIN HCL 1500 MG/300ML IV SOLN
1500.0000 mg | INTRAVENOUS | Status: AC
  Administered 2020-06-02: 1500 mg via INTRAVENOUS
  Filled 2020-06-01 (×2): qty 300

## 2020-06-01 MED ORDER — DEXTROSE 5 % IV SOLN
3.0000 g | INTRAVENOUS | Status: DC
  Filled 2020-06-01 (×3): qty 3000

## 2020-06-01 NOTE — Progress Notes (Signed)
Anesthesia Chart Review:  Patient has a history of atypical chest pain.  Review of previous primary care physician notes confirms this.  He says he has had previous cardiac testing that was benign.  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  He says in 2016 he underwent a treadmill stress test in [place] and completed the test without any chest pain, EKG changes, or any other concerning findings.  Again he was advised to continue medical management of risk factors and follow with PCP.  He says he has not had any chest pain since that time.  He denies any cardiovascular symptoms with activity.  He says he can push mow his lawn as well as go up multiple flights of stairs without chest pain or undue shortness of breath.  He has type 2 diabetes and is well managed with most recent A1c 6.8 on 05/29/2020.  He is not on insulin.  His hypertension is also well managed, blood pressure 129/80 at preadmission testing.  He does report a history of fatty liver with intermittently elevated transaminases.  Review of preop labs shows mildly elevated AST and ALT at 51 and 65 respectively.  Remainder of labs unremarkable.  Last seen by PCP [name], FNP 03/10/2020.  Chronic medical conditions were noted to be well controlled at that time.  He was referred to neurosurgery for cervical spine pathology.  No diagnosed history of OSA but did have stop bang score of 5 at PAT.  EKG 05/29/2020: Sinus rhythm with 1st degree A-V block.  Rate 87. Cannot rule out Anterior infarct , age undetermined.  Exercise stress test 09/14/14 (copy on chart): The patient exercised according to the Bruce protocol for 3 minutes 19 seconds, achieving a work level of max METS: 7.20.  Resting heart rate of 97 bpm rose to a maximum heart rate of 150 bpm.  This value represents 90% of the maximal age-predicted heart rate.  Resting blood pressure of 138/80 mmHg, rose  to maximum blood pressure of 142/90 mmHg.  Interpretation: Negative study   Zannie Cove [facility]/Anesthesiology Phone [phone] 06/01/2020 11:36 AM

## 2020-06-01 NOTE — Anesthesia Preprocedure Evaluation (Addendum)
Anesthesia Evaluation  Patient identified by MRN, date of birth, ID band Patient awake    Reviewed: Allergy & Precautions, NPO status , Patient's Chart, lab work & pertinent test results  Airway Mallampati: I  TM Distance: >3 FB Neck ROM: Full    Dental   Pulmonary former smoker,    Pulmonary exam normal        Cardiovascular hypertension, Pt. on medications Normal cardiovascular exam     Neuro/Psych Depression    GI/Hepatic GERD  Medicated and Controlled,  Endo/Other  diabetes, Type 2  Renal/GU      Musculoskeletal   Abdominal   Peds  Hematology   Anesthesia Other Findings   Reproductive/Obstetrics                            Anesthesia Physical Anesthesia Plan  ASA: III  Anesthesia Plan: General   Post-op Pain Management:    Induction: Intravenous  PONV Risk Score and Plan: 2 and Ondansetron, Midazolam and Dexamethasone  Airway Management Planned: Oral ETT  Additional Equipment:   Intra-op Plan:   Post-operative Plan: Extubation in OR  Informed Consent: I have reviewed the patients History and Physical, chart, labs and discussed the procedure including the risks, benefits and alternatives for the proposed anesthesia with the patient or authorized representative who has indicated his/her understanding and acceptance.       Plan Discussed with: CRNA and Surgeon  Anesthesia Plan Comments: (PAT note by Antionette Poles, PA-C: Patient has a history of atypical chest pain.  Review of previous primary care physician notes confirms this.  He says he has had previous cardiac testing that was benign.  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  He says in 2016 he underwent a treadmill stress test in [place] and completed the test without any chest pain, EKG changes, or any other concerning findings.   Again he was advised to continue medical management of risk factors and follow with PCP.  He says he has not had any chest pain since that time.  He denies any cardiovascular symptoms with activity.  He says he can push mow his lawn as well as go up multiple flights of stairs without chest pain or undue shortness of breath.  He has type 2 diabetes and is well managed with most recent A1c 6.8 on 05/29/2020.  He is not on insulin.  His hypertension is also well managed, blood pressure 129/80 at preadmission testing.  He does report a history of fatty liver with intermittently elevated transaminases.  Review of preop labs shows mildly elevated AST and ALT at 51 and 65 respectively.  Remainder of labs unremarkable.  Last seen by PCP [name], FNP 03/10/2020.  Chronic medical conditions were noted to be well controlled at that time.  He was referred to neurosurgery for cervical spine pathology.  No diagnosed history of OSA but did have stop bang score of 5 at PAT.  EKG 05/29/2020: Sinus rhythm with 1st degree A-V block.  Rate 87. Cannot rule out Anterior infarct , age undetermined.  Exercise stress test 09/14/14 (copy on chart): The patient exercised according to the Bruce protocol for 3 minutes 19 seconds, achieving a work level of max METS: 7.20.  Resting heart rate of 97 bpm rose to a maximum heart rate of 150 bpm.  This value represents 90% of the maximal age-predicted heart rate.  Resting blood pressure of 138/80 mmHg, rose to maximum blood pressure of 142/90 mmHg.  Interpretation: Negative study )       Anesthesia Quick Evaluation

## 2020-06-02 ENCOUNTER — Ambulatory Visit (HOSPITAL_COMMUNITY): Payer: PRIVATE HEALTH INSURANCE | Admitting: Vascular Surgery

## 2020-06-02 ENCOUNTER — Other Ambulatory Visit: Payer: Self-pay

## 2020-06-02 ENCOUNTER — Encounter (HOSPITAL_COMMUNITY)
Admission: RE | Disposition: A | Discharge status: Home / Self Care | Payer: Self-pay | Source: Home / Self Care | Attending: Neurological Surgery

## 2020-06-02 ENCOUNTER — Ambulatory Visit (HOSPITAL_COMMUNITY): Payer: PRIVATE HEALTH INSURANCE

## 2020-06-02 ENCOUNTER — Observation Stay (HOSPITAL_COMMUNITY)
Admission: RE | Admit: 2020-06-02 | Discharge: 2020-06-03 | Disposition: A | Discharge status: Home / Self Care | Payer: PRIVATE HEALTH INSURANCE | Attending: Neurological Surgery | Admitting: Neurological Surgery

## 2020-06-02 ENCOUNTER — Encounter (HOSPITAL_COMMUNITY): Payer: Self-pay | Admitting: Neurological Surgery

## 2020-06-02 DIAGNOSIS — Z419 Encounter for procedure for purposes other than remedying health state, unspecified: Secondary | ICD-10-CM

## 2020-06-02 DIAGNOSIS — M4802 Spinal stenosis, cervical region: Secondary | ICD-10-CM | POA: Insufficient documentation

## 2020-06-02 DIAGNOSIS — Z87891 Personal history of nicotine dependence: Secondary | ICD-10-CM | POA: Diagnosis not present

## 2020-06-02 DIAGNOSIS — I10 Essential (primary) hypertension: Secondary | ICD-10-CM | POA: Insufficient documentation

## 2020-06-02 DIAGNOSIS — E119 Type 2 diabetes mellitus without complications: Secondary | ICD-10-CM | POA: Insufficient documentation

## 2020-06-02 DIAGNOSIS — Z7984 Long term (current) use of oral hypoglycemic drugs: Secondary | ICD-10-CM | POA: Diagnosis not present

## 2020-06-02 DIAGNOSIS — M5412 Radiculopathy, cervical region: Secondary | ICD-10-CM | POA: Diagnosis present

## 2020-06-02 DIAGNOSIS — M4722 Other spondylosis with radiculopathy, cervical region: Secondary | ICD-10-CM | POA: Diagnosis not present

## 2020-06-02 DIAGNOSIS — E039 Hypothyroidism, unspecified: Secondary | ICD-10-CM | POA: Insufficient documentation

## 2020-06-02 DIAGNOSIS — M542 Cervicalgia: Secondary | ICD-10-CM | POA: Diagnosis present

## 2020-06-02 DIAGNOSIS — Z7982 Long term (current) use of aspirin: Secondary | ICD-10-CM | POA: Diagnosis not present

## 2020-06-02 DIAGNOSIS — Z79899 Other long term (current) drug therapy: Secondary | ICD-10-CM | POA: Insufficient documentation

## 2020-06-02 HISTORY — PX: ANTERIOR CERVICAL DECOMP/DISCECTOMY FUSION: SHX1161

## 2020-06-02 LAB — GLUCOSE, CAPILLARY
Glucose-Capillary: 135 mg/dL — ABNORMAL HIGH (ref 70–99)
Glucose-Capillary: 172 mg/dL — ABNORMAL HIGH (ref 70–99)
Glucose-Capillary: 200 mg/dL — ABNORMAL HIGH (ref 70–99)
Glucose-Capillary: 180 mg/dL — ABNORMAL HIGH (ref 70–99)

## 2020-06-02 LAB — CBC
HCT: 44 % (ref 39.0–52.0)
Hemoglobin: 14.5 g/dL (ref 13.0–17.0)
MCH: 31 pg (ref 26.0–34.0)
MCHC: 33 g/dL (ref 30.0–36.0)
MCV: 94 fL (ref 80.0–100.0)
Platelets: 238 10*3/uL (ref 150–400)
RBC: 4.68 MIL/uL (ref 4.22–5.81)
RDW: 13.1 % (ref 11.5–15.5)
WBC: 11.6 10*3/uL — ABNORMAL HIGH (ref 4.0–10.5)
nRBC: 0 % (ref 0.0–0.2)

## 2020-06-02 LAB — PROTIME-INR
INR: 0.9 (ref 0.8–1.2)
Prothrombin Time: 12 seconds (ref 11.4–15.2)

## 2020-06-02 LAB — CREATININE, SERUM
Creatinine, Ser: 0.94 mg/dL (ref 0.61–1.24)
GFR calc Af Amer: >60 mL/min (ref >60)
GFR calc non Af Amer: >60 mL/min (ref >60)

## 2020-06-02 LAB — ABO/RH: ABO/RH(D): A POS

## 2020-06-02 IMAGING — RF DG C-ARM 1-60 MIN
1 series · 4 of 4 positions shown · IV contrast (agent unspecified)
Comparison: MRI [DATE]

CLINICAL DATA: ACDF.

EXAM:
DG C-ARM 1-60 MIN
CONTRAST:  None.
FLUOROSCOPY TIME:  Fluoroscopy Time:  0 minutes 20 seconds
Radiation Exposure Index (if provided by the fluoroscopic device):
15.5 mGy

[Series 1: run · 4 of 4 slices shown]
[im 1/4]
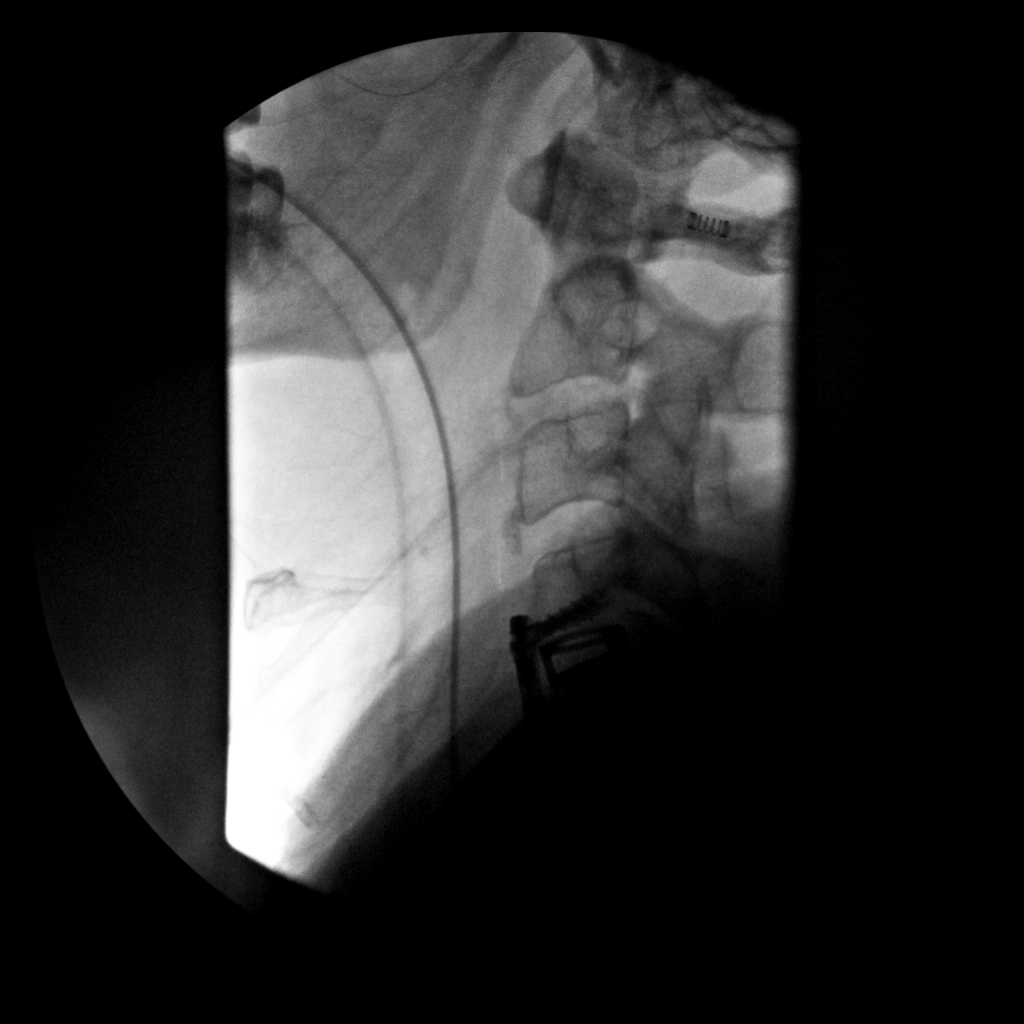
[im 2/4]
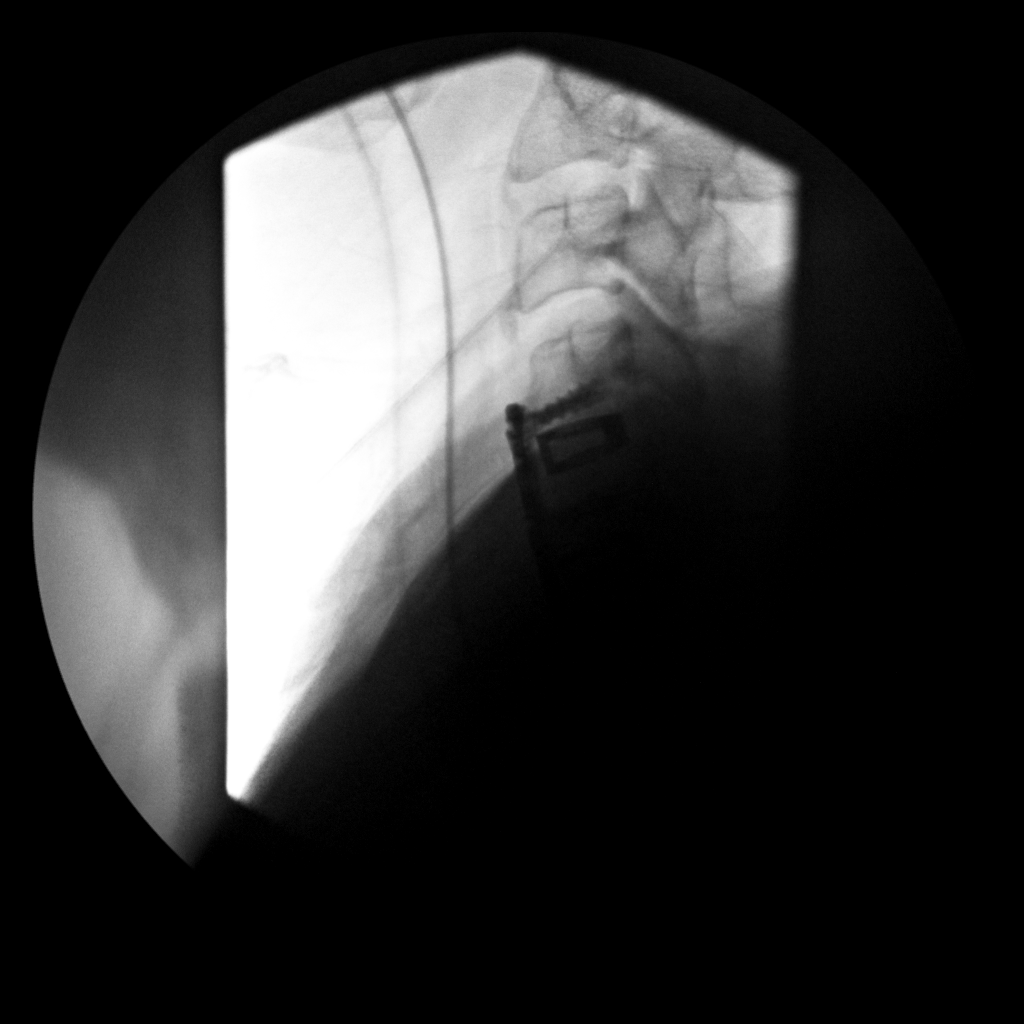
[im 3/4]
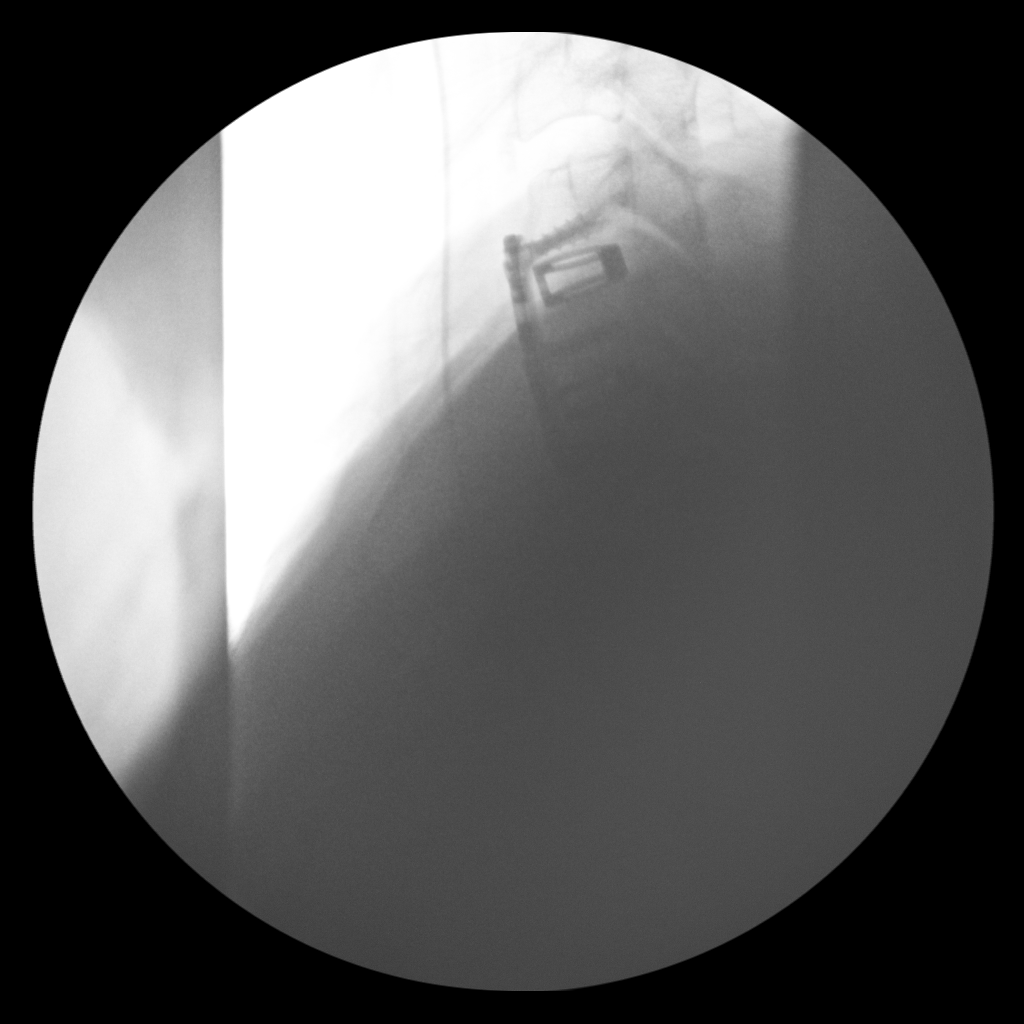
[im 4/4]
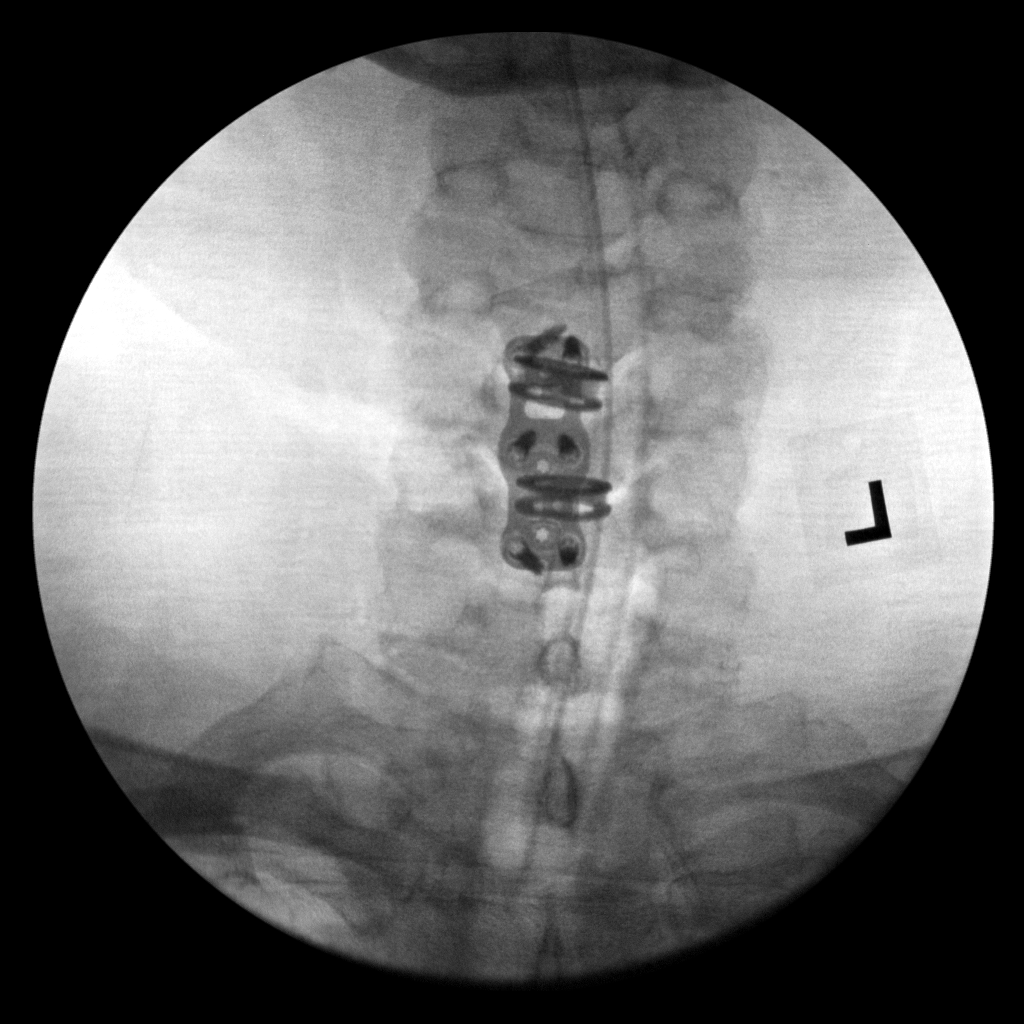

[4 of 4 positions shown; findings below may reference images not displayed]

FINDINGS: Mid cervical anterior interbody fusion. Visualized hardware intact.
Anatomic alignment.
IMPRESSION: Postsurgical changes cervical spine.

## 2020-06-02 IMAGING — RF DG CERVICAL SPINE 2 OR 3 VIEWS
1 series · 4 of 4 positions shown · non-contrast
Comparison: MRI [DATE].

CLINICAL DATA: Cervical spine fusion.

EXAM:
CERVICAL SPINE - 2-3 VIEW

[Series 1: run · 4 of 4 slices shown]
[im 1/4]
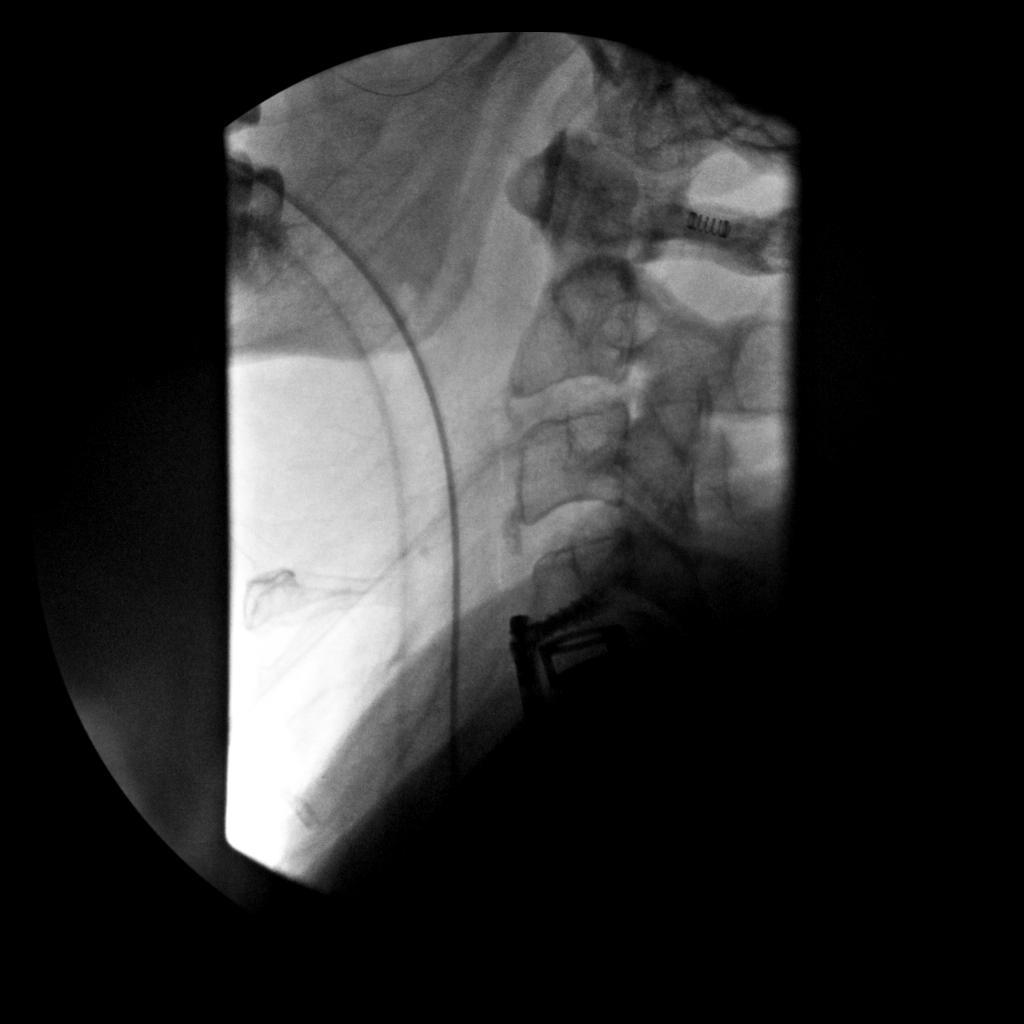
[im 2/4]
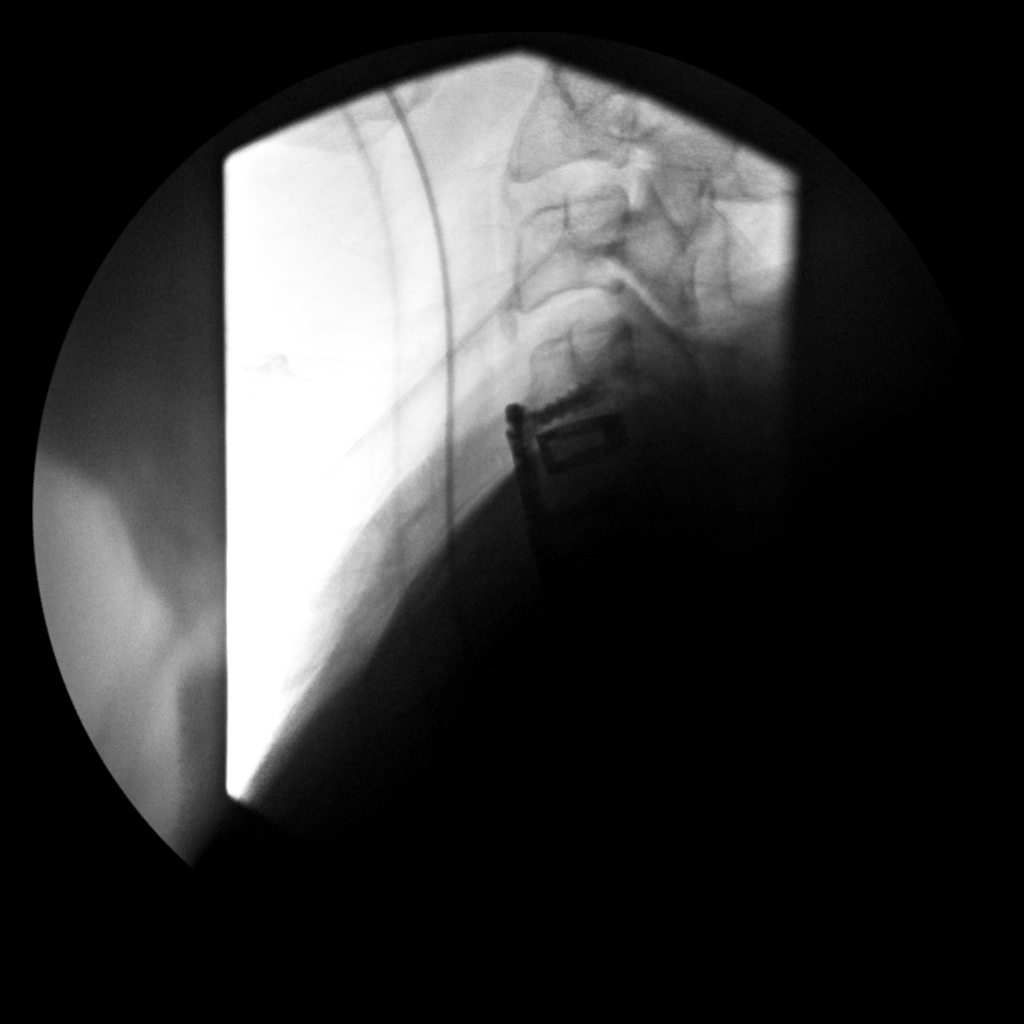
[im 3/4]
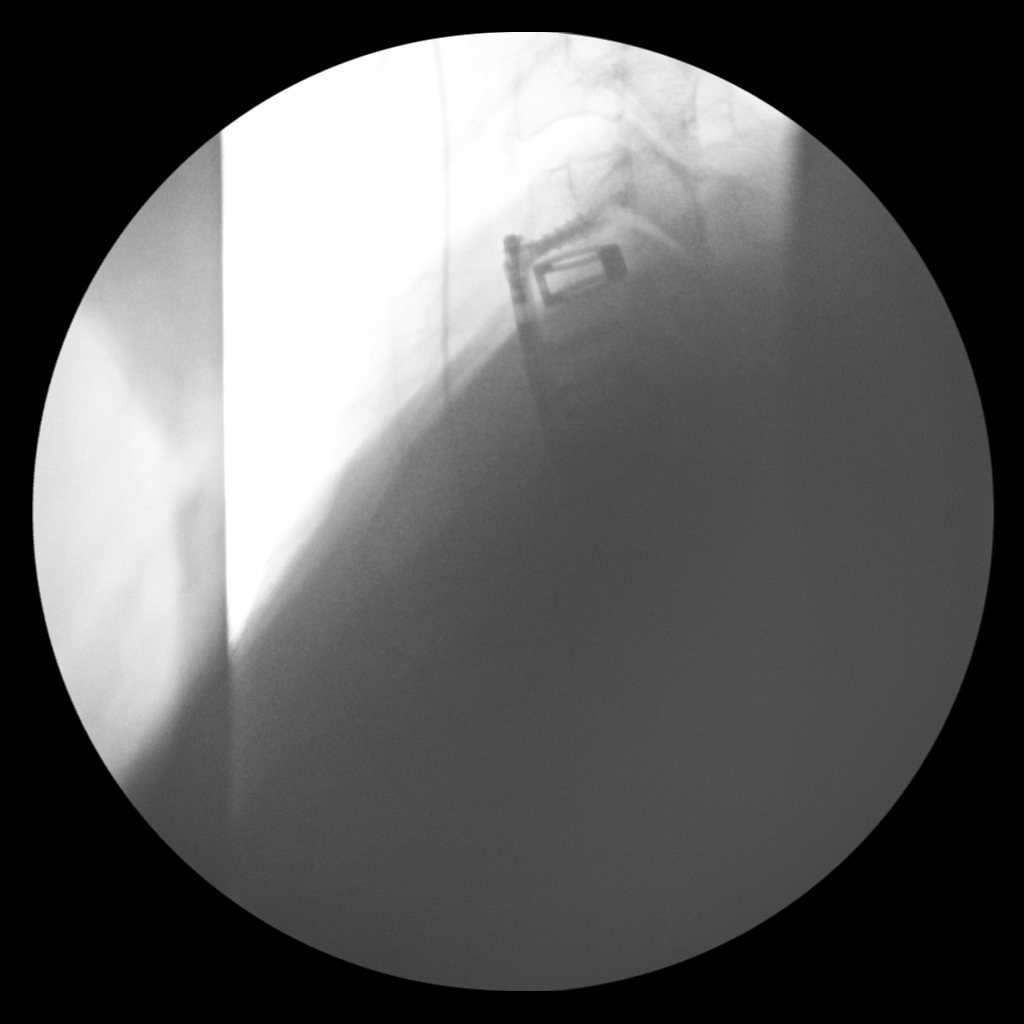
[im 4/4]
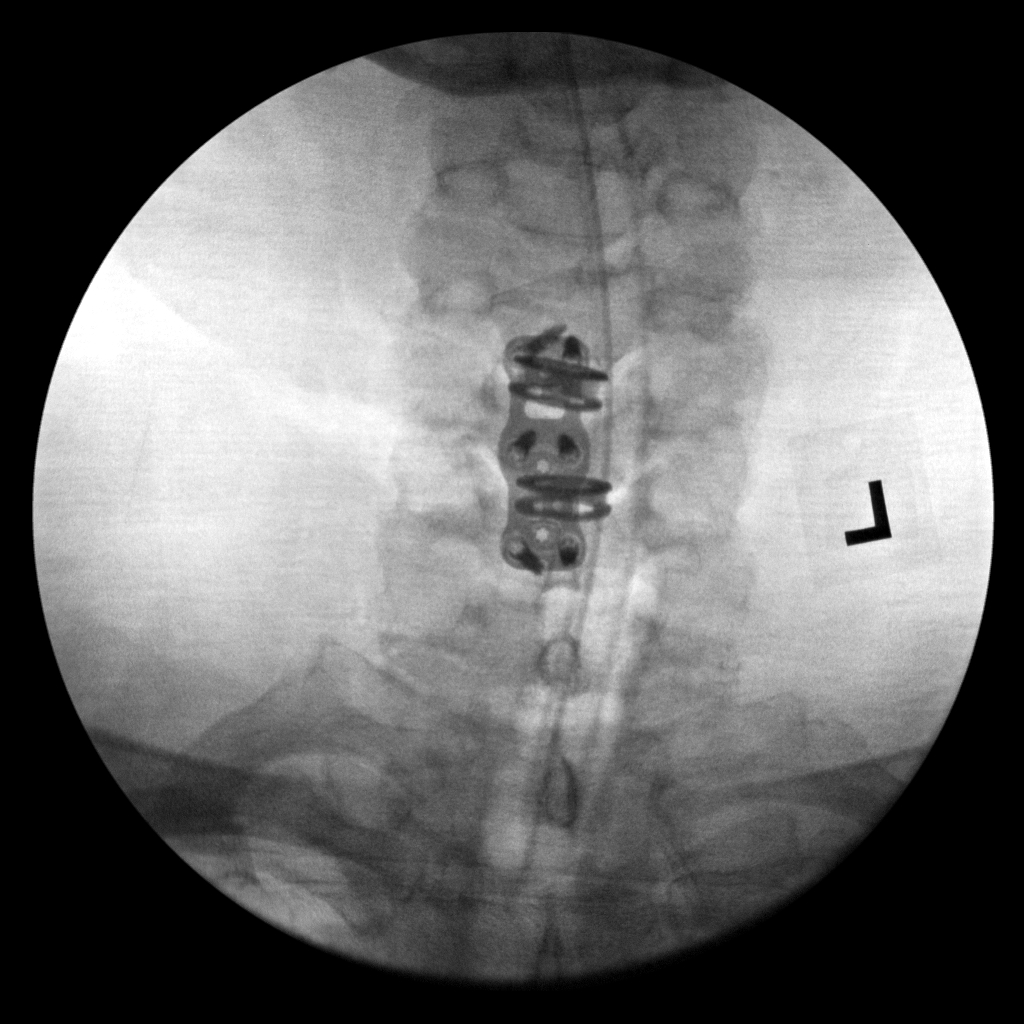

[4 of 4 positions shown; findings below may reference images not displayed]

FINDINGS: Mid cervical spine anterior and interbody fusion. Visualized
hardware intact. Anatomic alignment.
IMPRESSION: Postsurgical changes mid cervical spine.

## 2020-06-02 SURGERY — ANTERIOR CERVICAL DECOMPRESSION/DISCECTOMY FUSION 2 LEVELS
Anesthesia: General | Site: Spine Cervical

## 2020-06-02 MED ORDER — AMLODIPINE BESYLATE 10 MG PO TABS
10.0000 mg | ORAL_TABLET | Freq: Every day | ORAL | Status: DC
  Administered 2020-06-03: 10 mg via ORAL
  Filled 2020-06-02: qty 2
  Filled 2020-06-02: qty 1

## 2020-06-02 MED ORDER — PROPOFOL 10 MG/ML IV BOLUS
INTRAVENOUS | Status: AC
  Filled 2020-06-02: qty 40

## 2020-06-02 MED ORDER — INSULIN ASPART 100 UNIT/ML ~~LOC~~ SOLN
0.0000 [IU] | Freq: Three times a day (TID) | SUBCUTANEOUS | Status: DC
  Administered 2020-06-02 – 2020-06-03 (×3): 3 [IU] via SUBCUTANEOUS
  Filled 2020-06-02: qty 0.15

## 2020-06-02 MED ORDER — HYDROMORPHONE HCL 1 MG/ML IJ SOLN
INTRAMUSCULAR | Status: AC
  Filled 2020-06-02: qty 1

## 2020-06-02 MED ORDER — PHENOL 1.4 % MT LIQD: 1.0000 | OROMUCOSAL | Status: DC | PRN

## 2020-06-02 MED ORDER — ACETAMINOPHEN 650 MG RE SUPP: 650.0000 mg | RECTAL | Status: DC | PRN

## 2020-06-02 MED ORDER — KETOROLAC TROMETHAMINE 15 MG/ML IJ SOLN
INTRAMUSCULAR | Status: AC
  Filled 2020-06-02: qty 1

## 2020-06-02 MED ORDER — DEXAMETHASONE SODIUM PHOSPHATE 10 MG/ML IJ SOLN
INTRAMUSCULAR | Status: AC
  Filled 2020-06-02: qty 1

## 2020-06-02 MED ORDER — HEPARIN SODIUM (PORCINE) 5000 UNIT/ML IJ SOLN: 5000.0000 [IU] | Freq: Three times a day (TID) | INTRAMUSCULAR | Status: DC

## 2020-06-02 MED ORDER — THROMBIN 5000 UNITS EX SOLR
CUTANEOUS | Status: DC | PRN
  Administered 2020-06-02 (×2): 5000 [IU] via TOPICAL

## 2020-06-02 MED ORDER — HEMOSTATIC AGENTS (NO CHARGE) OPTIME
TOPICAL | Status: DC | PRN
  Administered 2020-06-02: 1

## 2020-06-02 MED ORDER — LIDOCAINE 2% (20 MG/ML) 5 ML SYRINGE
INTRAMUSCULAR | Status: DC | PRN
  Administered 2020-06-02: 100 mg via INTRAVENOUS

## 2020-06-02 MED ORDER — LACTATED RINGERS IV SOLN: INTRAVENOUS | Status: DC

## 2020-06-02 MED ORDER — MENTHOL 3 MG MT LOZG: 1.0000 | LOZENGE | OROMUCOSAL | Status: DC | PRN

## 2020-06-02 MED ORDER — SODIUM CHLORIDE 0.9 % IV SOLN: INTRAVENOUS | Status: DC

## 2020-06-02 MED ORDER — MEPERIDINE HCL 25 MG/ML IJ SOLN: 6.2500 mg | INTRAMUSCULAR | Status: DC | PRN

## 2020-06-02 MED ORDER — LEVOTHYROXINE SODIUM 75 MCG PO TABS
75.0000 ug | ORAL_TABLET | Freq: Every day | ORAL | Status: DC
  Administered 2020-06-03: 75 ug via ORAL
  Filled 2020-06-02: qty 1

## 2020-06-02 MED ORDER — CHLORHEXIDINE GLUCONATE CLOTH 2 % EX PADS: 6.0000 | MEDICATED_PAD | Freq: Once | CUTANEOUS | Status: DC

## 2020-06-02 MED ORDER — THROMBIN 5000 UNITS EX SOLR
CUTANEOUS | Status: AC
  Filled 2020-06-02: qty 15000

## 2020-06-02 MED ORDER — ATORVASTATIN CALCIUM 80 MG PO TABS
80.0000 mg | ORAL_TABLET | Freq: Every evening | ORAL | Status: DC
  Administered 2020-06-02: 80 mg via ORAL
  Filled 2020-06-02: qty 1

## 2020-06-02 MED ORDER — DEXAMETHASONE SODIUM PHOSPHATE 10 MG/ML IJ SOLN
INTRAMUSCULAR | Status: DC | PRN
  Administered 2020-06-02: 10 mg via INTRAVENOUS

## 2020-06-02 MED ORDER — MIDAZOLAM HCL 2 MG/2ML IJ SOLN
INTRAMUSCULAR | Status: AC
  Filled 2020-06-02: qty 2

## 2020-06-02 MED ORDER — THROMBIN 5000 UNITS EX SOLR
OROMUCOSAL | Status: DC | PRN
  Administered 2020-06-02 (×2): 5 mL

## 2020-06-02 MED ORDER — HYDROCODONE-ACETAMINOPHEN 5-325 MG PO TABS
1.0000 | ORAL_TABLET | ORAL | Status: DC | PRN
  Administered 2020-06-02: 2 via ORAL
  Filled 2020-06-02: qty 2

## 2020-06-02 MED ORDER — ONDANSETRON HCL 4 MG PO TABS: 4.0000 mg | ORAL_TABLET | Freq: Four times a day (QID) | ORAL | Status: DC | PRN

## 2020-06-02 MED ORDER — MIDAZOLAM HCL 5 MG/5ML IJ SOLN
INTRAMUSCULAR | Status: DC | PRN
  Administered 2020-06-02: 2 mg via INTRAVENOUS

## 2020-06-02 MED ORDER — ORAL CARE MOUTH RINSE: 15.0000 mL | Freq: Once | OROMUCOSAL | Status: AC

## 2020-06-02 MED ORDER — SENNOSIDES-DOCUSATE SODIUM 8.6-50 MG PO TABS: 1.0000 | ORAL_TABLET | Freq: Every evening | ORAL | Status: DC | PRN

## 2020-06-02 MED ORDER — IBUPROFEN 400 MG PO TABS: 400.0000 mg | ORAL_TABLET | Freq: Three times a day (TID) | ORAL | Status: DC | PRN

## 2020-06-02 MED ORDER — VANCOMYCIN HCL 1500 MG/300ML IV SOLN
1500.0000 mg | Freq: Two times a day (BID) | INTRAVENOUS | Status: DC
  Administered 2020-06-02 – 2020-06-03 (×2): 1500 mg via INTRAVENOUS
  Filled 2020-06-02 (×3): qty 300

## 2020-06-02 MED ORDER — CHLORHEXIDINE GLUCONATE 0.12 % MT SOLN
15.0000 mL | Freq: Once | OROMUCOSAL | Status: AC
  Administered 2020-06-02: 15 mL via OROMUCOSAL
  Filled 2020-06-02: qty 15

## 2020-06-02 MED ORDER — ONDANSETRON HCL 4 MG/2ML IJ SOLN: 4.0000 mg | Freq: Once | INTRAMUSCULAR | Status: DC | PRN

## 2020-06-02 MED ORDER — THROMBIN 5000 UNITS EX SOLR
CUTANEOUS | Status: AC
  Filled 2020-06-02: qty 5000

## 2020-06-02 MED ORDER — KETOROLAC TROMETHAMINE 15 MG/ML IJ SOLN
15.0000 mg | Freq: Four times a day (QID) | INTRAMUSCULAR | Status: AC
  Administered 2020-06-02 – 2020-06-03 (×4): 15 mg via INTRAVENOUS
  Filled 2020-06-02 (×3): qty 1

## 2020-06-02 MED ORDER — ACETAMINOPHEN 325 MG PO TABS: 650.0000 mg | ORAL_TABLET | ORAL | Status: DC | PRN

## 2020-06-02 MED ORDER — PROPOFOL 10 MG/ML IV BOLUS
INTRAVENOUS | Status: DC | PRN
  Administered 2020-06-02: 130 mg via INTRAVENOUS

## 2020-06-02 MED ORDER — ONDANSETRON HCL 4 MG/2ML IJ SOLN: 4.0000 mg | Freq: Four times a day (QID) | INTRAMUSCULAR | Status: DC | PRN

## 2020-06-02 MED ORDER — SODIUM CHLORIDE 0.9% FLUSH: 3.0000 mL | INTRAVENOUS | Status: DC | PRN

## 2020-06-02 MED ORDER — SODIUM CHLORIDE 0.9% FLUSH
3.0000 mL | Freq: Two times a day (BID) | INTRAVENOUS | Status: DC
  Administered 2020-06-02 (×2): 3 mL via INTRAVENOUS

## 2020-06-02 MED ORDER — PANTOPRAZOLE SODIUM 40 MG PO TBEC
40.0000 mg | DELAYED_RELEASE_TABLET | Freq: Every day | ORAL | Status: DC
  Administered 2020-06-02 – 2020-06-03 (×2): 40 mg via ORAL
  Filled 2020-06-02 (×2): qty 1

## 2020-06-02 MED ORDER — GABAPENTIN 600 MG PO TABS
600.0000 mg | ORAL_TABLET | Freq: Every evening | ORAL | Status: DC
  Administered 2020-06-02: 600 mg via ORAL
  Filled 2020-06-02: qty 1

## 2020-06-02 MED ORDER — ROCURONIUM BROMIDE 10 MG/ML (PF) SYRINGE
PREFILLED_SYRINGE | INTRAVENOUS | Status: DC | PRN
  Administered 2020-06-02: 100 mg via INTRAVENOUS
  Administered 2020-06-02 (×2): 20 mg via INTRAVENOUS
  Administered 2020-06-02: 15 mg via INTRAVENOUS
  Administered 2020-06-02: 20 mg via INTRAVENOUS

## 2020-06-02 MED ORDER — FENTANYL CITRATE (PF) 100 MCG/2ML IJ SOLN
INTRAMUSCULAR | Status: DC | PRN
Start: 2020-06-02 — End: 2020-06-02
  Administered 2020-06-02 (×2): 50 ug via INTRAVENOUS
  Administered 2020-06-02: 150 ug via INTRAVENOUS
  Administered 2020-06-02 (×2): 50 ug via INTRAVENOUS

## 2020-06-02 MED ORDER — HYDROXYZINE HCL 50 MG/ML IM SOLN: 50.0000 mg | Freq: Four times a day (QID) | INTRAMUSCULAR | Status: DC | PRN

## 2020-06-02 MED ORDER — ONDANSETRON HCL 4 MG/2ML IJ SOLN
INTRAMUSCULAR | Status: DC | PRN
  Administered 2020-06-02: 4 mg via INTRAVENOUS

## 2020-06-02 MED ORDER — MORPHINE SULFATE (PF) 2 MG/ML IV SOLN: 2.0000 mg | INTRAVENOUS | Status: DC | PRN

## 2020-06-02 MED ORDER — SUGAMMADEX SODIUM 500 MG/5ML IV SOLN
INTRAVENOUS | Status: DC | PRN
  Administered 2020-06-02: 300 mg via INTRAVENOUS

## 2020-06-02 MED ORDER — LISINOPRIL 20 MG PO TABS
20.0000 mg | ORAL_TABLET | Freq: Every day | ORAL | Status: DC
  Administered 2020-06-02 – 2020-06-03 (×2): 20 mg via ORAL
  Filled 2020-06-02 (×2): qty 1

## 2020-06-02 MED ORDER — METHOCARBAMOL 1000 MG/10ML IJ SOLN
500.0000 mg | Freq: Four times a day (QID) | INTRAVENOUS | Status: DC | PRN
  Filled 2020-06-02: qty 5

## 2020-06-02 MED ORDER — ONDANSETRON HCL 4 MG/2ML IJ SOLN
INTRAMUSCULAR | Status: AC
  Filled 2020-06-02: qty 2

## 2020-06-02 MED ORDER — METHOCARBAMOL 500 MG PO TABS
500.0000 mg | ORAL_TABLET | Freq: Four times a day (QID) | ORAL | Status: DC | PRN
  Administered 2020-06-02: 500 mg via ORAL
  Filled 2020-06-02: qty 1

## 2020-06-02 MED ORDER — ASPIRIN 325 MG PO TABS: 325.0000 mg | ORAL_TABLET | Freq: Every evening | ORAL | Status: DC

## 2020-06-02 MED ORDER — FENTANYL CITRATE (PF) 250 MCG/5ML IJ SOLN
INTRAMUSCULAR | Status: AC
Start: 2020-06-02 — End: ?
  Filled 2020-06-02: qty 5

## 2020-06-02 MED ORDER — HYDROMORPHONE HCL 1 MG/ML IJ SOLN
0.2500 mg | INTRAMUSCULAR | Status: DC | PRN
  Administered 2020-06-02 (×2): 0.5 mg via INTRAVENOUS

## 2020-06-02 MED ORDER — SPIRONOLACTONE 25 MG PO TABS
25.0000 mg | ORAL_TABLET | Freq: Every day | ORAL | Status: DC
  Administered 2020-06-02 – 2020-06-03 (×2): 25 mg via ORAL
  Filled 2020-06-02 (×2): qty 1

## 2020-06-02 MED ORDER — 0.9 % SODIUM CHLORIDE (POUR BTL) OPTIME
TOPICAL | Status: DC | PRN
  Administered 2020-06-02: 1000 mL

## 2020-06-02 MED ORDER — SUGAMMADEX SODIUM 500 MG/5ML IV SOLN
INTRAVENOUS | Status: AC
  Filled 2020-06-02: qty 5

## 2020-06-02 MED ORDER — FENTANYL CITRATE (PF) 250 MCG/5ML IJ SOLN
INTRAMUSCULAR | Status: AC
  Filled 2020-06-02: qty 5

## 2020-06-02 MED ORDER — TRIAMCINOLONE ACETONIDE 40 MG/ML IJ SUSP
INTRAMUSCULAR | Status: AC
  Filled 2020-06-02: qty 5

## 2020-06-02 SURGICAL SUPPLY — 65 items
APL SKNCLS STERI-STRIP NONHPOA (GAUZE/BANDAGES/DRESSINGS)
BAG DECANTER FOR FLEXI CONT (MISCELLANEOUS) ×2 IMPLANT
BAND INSRT 18 STRL LF DISP RB (MISCELLANEOUS) ×2
BAND RUBBER #18 3X1/16 STRL (MISCELLANEOUS) ×4 IMPLANT
BENZOIN TINCTURE PRP APPL 2/3 (GAUZE/BANDAGES/DRESSINGS) IMPLANT
BIT DRILL NEURO 2X3.1 SFT TUCH (MISCELLANEOUS) ×1 IMPLANT
BLADE CLIPPER SURG (BLADE) IMPLANT
BUR CARBIDE MATCH 3.0 (BURR) ×2 IMPLANT
CANISTER SUCT 3000ML PPV (MISCELLANEOUS) ×2 IMPLANT
CARTRIDGE OIL MAESTRO DRILL (MISCELLANEOUS) ×1 IMPLANT
COVER MAYO STAND STRL (DRAPES) ×4 IMPLANT
COVER WAND RF STERILE (DRAPES) ×2 IMPLANT
DEVICE ENDSKLTN IMPL 16X14X7X6 (Cage) IMPLANT
DEVICE ENDSKLTN TC NANOLCK 6MM (Cage) IMPLANT
DIFFUSER DRILL AIR PNEUMATIC (MISCELLANEOUS) ×2 IMPLANT
DRAPE C-ARM 42X72 X-RAY (DRAPES) ×2 IMPLANT
DRAPE HALF SHEET 40X57 (DRAPES) IMPLANT
DRAPE LAPAROTOMY 100X72 PEDS (DRAPES) ×2 IMPLANT
DRAPE MICROSCOPE LEICA (MISCELLANEOUS) ×2 IMPLANT
DRILL NEURO 2X3.1 SOFT TOUCH (MISCELLANEOUS) ×2
DRSG OPSITE POSTOP 4X6 (GAUZE/BANDAGES/DRESSINGS) ×1 IMPLANT
DURAPREP 6ML APPLICATOR 50/CS (WOUND CARE) ×2 IMPLANT
ELECT COATED BLADE 2.86 ST (ELECTRODE) ×2 IMPLANT
ELECT REM PT RETURN 9FT ADLT (ELECTROSURGICAL) ×2
ELECTRODE REM PT RTRN 9FT ADLT (ELECTROSURGICAL) ×1 IMPLANT
ENDOSKELETON IMPLANT 16X14X7X6 (Cage) ×2 IMPLANT
ENDOSKELETON TC NANOLOCK 6MM (Cage) ×2 IMPLANT
EVACUATOR 1/8 PVC DRAIN (DRAIN) ×1 IMPLANT
GAUZE 4X4 16PLY RFD (DISPOSABLE) IMPLANT
GLOVE BIOGEL PI IND STRL 8 (GLOVE) ×1 IMPLANT
GLOVE BIOGEL PI INDICATOR 8 (GLOVE) ×1
GLOVE ECLIPSE 8.0 STRL XLNG CF (GLOVE) ×4 IMPLANT
GLOVE EXAM NITRILE LRG STRL (GLOVE) IMPLANT
GLOVE EXAM NITRILE XL STR (GLOVE) IMPLANT
GLOVE EXAM NITRILE XS STR PU (GLOVE) IMPLANT
GOWN STRL REUS W/ TWL LRG LVL3 (GOWN DISPOSABLE) IMPLANT
GOWN STRL REUS W/ TWL XL LVL3 (GOWN DISPOSABLE) ×1 IMPLANT
GOWN STRL REUS W/TWL 2XL LVL3 (GOWN DISPOSABLE) IMPLANT
GOWN STRL REUS W/TWL LRG LVL3 (GOWN DISPOSABLE)
GOWN STRL REUS W/TWL XL LVL3 (GOWN DISPOSABLE) ×2
HEMOSTAT POWDER KIT SURGIFOAM (HEMOSTASIS) ×3 IMPLANT
KIT BASIN OR (CUSTOM PROCEDURE TRAY) ×2 IMPLANT
KIT TURNOVER KIT B (KITS) ×2 IMPLANT
NDL SPNL 18GX3.5 QUINCKE PK (NEEDLE) ×1 IMPLANT
NEEDLE HYPO 22GX1.5 SAFETY (NEEDLE) ×2 IMPLANT
NEEDLE SPNL 18GX3.5 QUINCKE PK (NEEDLE) ×2 IMPLANT
NS IRRIG 1000ML POUR BTL (IV SOLUTION) ×2 IMPLANT
OIL CARTRIDGE MAESTRO DRILL (MISCELLANEOUS) ×2
PACK LAMINECTOMY NEURO (CUSTOM PROCEDURE TRAY) ×2 IMPLANT
PAD ARMBOARD 7.5X6 YLW CONV (MISCELLANEOUS) ×6 IMPLANT
PASTE BONE GRAFTON 1CC (Bone Implant) ×1 IMPLANT
PATTIES SURGICAL 1X1 (DISPOSABLE) ×1 IMPLANT
PIN DISTRACTION 14MM (PIN) ×4 IMPLANT
PLATE 39MM (Plate) ×1 IMPLANT
PUTTY DBF 1CC CORTICAL FIBERS (Putty) ×1 IMPLANT
SCREW VA SD 3.5X16 (Screw) ×6 IMPLANT
SPONGE INTESTINAL PEANUT (DISPOSABLE) ×2 IMPLANT
SPONGE SURGIFOAM ABS GEL SZ50 (HEMOSTASIS) ×2 IMPLANT
STAPLER VISISTAT 35W (STAPLE) IMPLANT
STRIP CLOSURE SKIN 1/2X4 (GAUZE/BANDAGES/DRESSINGS) ×2 IMPLANT
TAPE SURG TRANSPORE 1 IN (GAUZE/BANDAGES/DRESSINGS) ×1 IMPLANT
TAPE SURGICAL TRANSPORE 1 IN (GAUZE/BANDAGES/DRESSINGS) ×2
TOWEL GREEN STERILE (TOWEL DISPOSABLE) ×2 IMPLANT
TOWEL GREEN STERILE FF (TOWEL DISPOSABLE) ×2 IMPLANT
WATER STERILE IRR 1000ML POUR (IV SOLUTION) ×2 IMPLANT

## 2020-06-02 NOTE — Op Note (Signed)
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 Arthrodesis C4-5, C5-6, anterior interbody technique 2. Placement of intervertebral biomechanical device C4-5, C5-6, medtronic titan cage 3. Placement of anterior instrumentation consisting of cervical plate and screws - C4, C5, C6; medtronic zevo plate  4. Use of morselized bone allograft 5. Use of intraoperative microscope for microdissection and neural element decompression   SURGEON: Dr. Monia Pouch, DO   ASSISTANT: Dr. Altamease Oiler, MD   ANESTHESIA: General Endotracheal   EBL: 50cc   SPECIMENS: None   DRAINS: medium HMV   COMPLICATIONS: None immediate   CONDITION: Hemodynamically stable to PACU   HISTORY: Daniel Scott is a 59 year old male presented to the outpatient clinic with complaints of chronic neck pain for years.  Over the past year his neck pain and left arm pain had progressed and been resistant to epidural steroid injections.  A new MRI was ordered which showed C4-5 severe left foraminal stenosis and C5-6 severe degenerative disc disease with bilateral severe foraminal stenosis and moderate canal stenosis.  There is also anterior listhesis of C4 on 5 and retrolisthesis of C5 on 6.  His pain was in the C5 and C6 dermatome on the left.  Treatment options were discussed including physical therapy, further injections and anterior cervical discectomy and fusion.   He had already failed multiple injections and pain was intractable during physical therapy therefore we stopped therapy. Surgery was offered in the form of anterior cervical diskectomy and fusion C4-C6.  After all questions were answered, informed consent was obtained.   PROCEDURE IN DETAIL: The patient was brought to the operating room at [facility] and transferred to the operative table. After induction of general anesthesia, the  patient was positioned on the operative table in the supine position with all pressure points meticulously padded.   His neck was placed in a slightly extended position.  The skin of the neck was then prepped and draped in the usual sterile fashion.   After timeout was conducted, skin incision was then made sharply with a 10 blade and Bovie electrocautery was used to dissect the subcutaneous tissue until the platysma was identified. The platysma was then divided and undermined. The sternocleidomastoid muscle was then identified and, utilizing natural fascial planes in the neck, the prevertebral fascia was identified and the carotid sheath was retracted laterally and the trachea and esophagus retracted medially.  A distraction pin was placed in the midline of the C4 vertebral body.  Fluoroscopy confirmed the C4 vertebral body pin placement.  Bovie electrocautery was used to dissect in the subperiosteal plane and elevate the bilateral longus coli muscles along C4, C5 and C6.  There was a sizable anterior osteophyte at C4-5, and significant anterior osteophytosis at C5-6 bilaterally.Self-retaining retractors were then placed over the C4-5 disc base under the longus coli.  A distraction pin was placed in C5 vertebral body at midline.  At this point, the microscope was draped and brought into the field, and the remainder of the case was done under the microscope using microdissecting technique.   The disc space was incised sharply and rongeurs were use to initially complete a discectomy.  The distractor was placed and the disc base was placed under distraction.  The high-speed drill was then used to complete discectomy until the posterior annulus was identified and removed and the posterior longitudinal ligament was identified. Using a nerve  hook, the PLL was elevated, and Kerrison rongeurs were used to remove the posterior longitudinal ligament and the ventral thecal sac was identified. Using a combination of  curettes and Kerrison rongeurs, complete decompression of the thecal sac and exiting nerve roots at this level was completed, and verified using micro-nerve hook.  The nerve hook could pass freely bilaterally.  The anterior osteophytes at C4-C5 were removed with an osteophyte rongeur.  Using Surgi-Flo epidural hemostasis was obtained.  The disc base was taken out of distraction.  Interbody trials were used and a 7 mm x 16 x 13 mm Medtronic titanium interbody cage was selected.  The intervertebral device was filled with allograft.  The epidural space was irrigated and noted to be excellently hemostatic.  The interbody device was placed until flush with the anterior vertebral bodies.  The C4 distraction pin was removed and hemostasis was achieved with Surgi-Flo.  Distraction pin was then placed in the C6 vertebral body in the midline.  The retractors were then repositioned along the C5-6 disc space under the Longus colli  muscles.  The C5-6 disc base was incised sharply and placed under distraction.  Using pituitary rongeurs and curettes and initial discectomy was performed. The high-speed drill was then used to complete discectomy until the posterior annulus was identified and removed and the posterior longitudinal ligament was identified. Using a nerve hook, the PLL was elevated, and Kerrison rongeurs were used to remove the posterior longitudinal ligament and the ventral thecal sac was identified. Using a combination of curettes and Kerrison rongeurs, complete decompression of the thecal sac and exiting nerve roots at this level was completed, and verified using micro-nerve hook.  The disc base was taken out of distraction.  The nerve hook could pass freely bilaterally.  Epidural hemostasis was achieved with Surgi-Flo.  Trial spacers were used and a 7 mm x 13 x 16 Medtronic Titan cage was selected.  The interbody device was filled with allograft.  The epidural space was irrigated and noted to be excellently  hemostatic.  The interbody device was then placed to be flush with the anterior vertebral bodies.  The distraction pins were removed and hemostasis was achieved with Surgi-Flo. The anterior osteophytes at C5 and C6 were removed with ronguers and a high-speed drill.   After placement of the intervertebral devices, the above anterior cervical plate was selected, and placed across the interspaces. Using a high-speed drill, the cortex of the cervical vertebral bodies was punctured, and screws inserted into C4, C5 and C6 bilaterally.  Final fluoroscopic images in AP and lateral projections were taken to confirm good hardware placement.   At this point, after all counts were verified to be correct, meticulous hemostasis was secured using a combination of bipolar electrocautery and passive hemostatics.  The wound was irrigated and noted to be hemostatic.  A medium Hemovac was tunneled laterally and placed in the prevertebral space.  The skin was then closed with staples.  Sterile dressing was applied.  Drapes were taken down.  The patient tolerated the procedure well and was extubated in the room and taken to the postanesthesia care unit in stable condition.

## 2020-06-02 NOTE — Progress Notes (Signed)
Pharmacy Antibiotic Note  Daniel Scott is a 59 y.o. male admitted on 06/02/2020 for spinal decompression/fusion on 06/02/20.  Pharmacy has been consulted for vancomycin dosing for surgical prophylaxis as patient has a drain.  Noted patient received pre-op vancomycin 1500mg  around 0700 today.  9/24 labs - SCr 0.84, nCrCL 95 ml/min.  Plan: Vanc 1500mg  IV Q12H  Monitor renal fxn, clinical progress, vanc trough as indicated BMET in AM  Height: 5\' 10"  (177.8 cm) Weight: (!) 149.7 kg (330 lb) IBW/kg (Calculated) : 73  Temp (24hrs), Avg:98.2 F (36.8 C), Min:96.8 F (36 C), Max:99 F (37.2 C)  Recent Labs  Lab 05/29/20 0958  WBC 7.9  CREATININE 0.84    Estimated Creatinine Clearance: 137.2 mL/min (by C-G formula based on SCr of 0.84 mg/dL).    Allergies  Allergen Reactions   Sulfa Antibiotics     Clorinda Wyble D. , PharmD, BCPS, BCCCP 06/02/2020, 1:40 PM

## 2020-06-02 NOTE — Anesthesia Postprocedure Evaluation (Signed)
Anesthesia Post Note  Patient: [name] Sr  Procedure(s) Performed: ANTERIOR CERVICAL DECOMPRESSION/DISCECTOMY FUSIONCERVICAL FOUR- CERVICAL FIVE, CERVICAL FIVE- CERVICAL SIX (N/A Spine Cervical)     Patient location during evaluation: PACU Anesthesia Type: General Level of consciousness: awake and alert Pain management: pain level controlled Vital Signs Assessment: post-procedure vital signs reviewed and stable Respiratory status: spontaneous breathing, nonlabored ventilation, respiratory function stable and patient connected to nasal cannula oxygen Cardiovascular status: blood pressure returned to baseline and stable Postop Assessment: no apparent nausea or vomiting Anesthetic complications: no   No complications documented.  Last Vitals:  Vitals:   06/02/20 1250 06/02/20 1316  BP: (!) 140/99 (!) 157/88  Pulse: 100 (!) 107  Resp: 15 20  Temp: 37 C 37.2 C  SpO2: 93% 96%    Last Pain:  Vitals:   06/02/20 1316  TempSrc: Oral  PainSc:                  [place] Luppino DAVID

## 2020-06-02 NOTE — Progress Notes (Signed)
Orthopedic Tech Progress Note Patient Details:  Daniel Scott 30-Oct-1960 [id number] RN said patient has collar Patient ID: Daniel Scott, male   DOB: 03-May-1961, 60 y.o.   MRN: [id number]   Daniel Scott 06/02/2020, 3:29 PM

## 2020-06-02 NOTE — Anesthesia Procedure Notes (Addendum)
Procedure Name: Intubation Date/Time: 06/02/2020 7:46 AM Performed by: Janace Litten, CRNA Pre-anesthesia Checklist: Patient identified, Emergency Drugs available, Suction available and Patient being monitored Patient Re-evaluated:Patient Re-evaluated prior to induction Oxygen Delivery Method: Circle System Utilized Preoxygenation: Pre-oxygenation with 100% oxygen Induction Type: IV induction Ventilation: Mask ventilation without difficulty Laryngoscope Size: Mac and 4 Grade View: Grade I Tube type: Oral Tube size: 7.5 mm Number of attempts: 1 Airway Equipment and Method: Stylet Placement Confirmation: ETT inserted through vocal cords under direct vision,  positive ETCO2 and breath sounds checked- equal and bilateral Secured at: 23 cm Tube secured with: Tape Dental Injury: Teeth and Oropharynx as per pre-operative assessment  Comments: Performed by Heide Scales, SRNA

## 2020-06-02 NOTE — Progress Notes (Signed)
   Providing Compassionate, Quality Care - Together  NEUROSURGERY PROGRESS NOTE   S: s/e in pacu   O: EXAM:  BP (!) 159/89 (BP Location: Left Arm)   Pulse 86   Temp (!) 96.8 F (36 C)   Resp 16   Ht 5\' 10"  (1.778 m)   Wt (!) 149.7 kg   SpO2 96%   BMI 47.35 kg/m   Awake, alert, oriented  Speech fluent, appropriate  CNs grossly intact  5/5 BUE/BLE  Neck soft Trachea midline nml phonation Collar and hmv in place  ASSESSMENT:  59 y.o. male with  1. C5/C6 radiculopathy and spondylosis  S/p ACDF C4-6 on 06/02/2020  PLAN: - pt/ot -pain control -neuro checks  -collar -mon hmv -dvt ppx     Thank you for allowing me to participate in this patient's care.  Please do not hesitate to call with questions or concerns.   06/04/2020, DO Neurosurgeon [facility] Cell: [phone]

## 2020-06-02 NOTE — Transfer of Care (Signed)
Immediate Anesthesia Transfer of Care Note  Patient: Daniel Scott  Procedure(s) Performed: ANTERIOR CERVICAL DECOMPRESSION/DISCECTOMY FUSIONCERVICAL FOUR- CERVICAL FIVE, CERVICAL FIVE- CERVICAL SIX (N/A Spine Cervical)  Patient Location: PACU  Anesthesia Type:General  Level of Consciousness: awake, alert  and oriented  Airway & Oxygen Therapy: Patient Spontanous Breathing and Patient connected to nasal cannula oxygen  Post-op Assessment: Report given to RN, Post -op Vital signs reviewed and stable and Patient moving all extremities  Post vital signs: Reviewed and stable  Last Vitals:  Vitals Value Taken Time  BP 159/89 06/02/20 1119  Temp    Pulse 88 06/02/20 1122  Resp 18 06/02/20 1122  SpO2 96 % 06/02/20 1122  Vitals shown include unvalidated device data.  Last Pain:  Vitals:   06/02/20 0618  TempSrc:   PainSc: 0-No pain         Complications: No complications documented.

## 2020-06-02 NOTE — H&P (Signed)
Providing Compassionate, Quality Care - Together  NEUROSURGERY HISTORY & PHYSICAL   Daniel Scott is an 59 y.o. male.   Chief Complaint: Neck and arm pain HPI: This is a 59 year old male with a history of chronic neck and left upper extremity pain that failed conservative management.  He has chronic left upper extremity C5 and C6 radiculopathy.  His pain radiates into his left shoulder and biceps and forearm.  He also has chronic neck pain which had initially been controlled with steroid injections but over the past year has progressively become more uncontrolled.  He denies difficulty with fine motor movement.  He presents today for anterior cervical decompression and fusion C4-C6.  His symptomatology has not changed at this time.  Past Medical History:  Diagnosis Date  . Arthritis    hands and feet  . Depression   . Diabetes mellitus without complication (HCC)    type 2  . Fatty liver   . GERD (gastroesophageal reflux disease)   . Hiatal hernia   . Hyperlipidemia   . Hypertension   . Hypothyroidism   . Neuromuscular disorder (HCC)    stenosis  . Pneumonia 1990  . Stenosis of cervical spine   . Thyroid disease     Past Surgical History:  Procedure Laterality Date  . CARDIAC CATHETERIZATION  1996; 2006   [facility]  . HYDROCELE EXCISION    . LAPAROSCOPIC GASTRIC BANDING    . LAPAROSCOPIC ROUX-EN-Y GASTRIC BYPASS WITH UPPER ENDOSCOPY AND REMOVAL OF LAP BAND  02/2012  . TONSILLECTOMY      Family History  Problem Relation Age of Onset  . Vascular Disease Mother   . Cancer Mother        Bone marrow  . Kidney disease Mother   . Thyroid disease Mother   . Hypertension Mother   . Diabetes Mother   . Coronary artery disease Father   . Colon cancer Father   . Heart disease Father   . Hyperlipidemia Father   . Hypertension Father   . Spina bifida Daughter   . Depression Son   . Anxiety disorder Son   . Hypertension Son   . Breast cancer  Maternal Grandmother   . Hypertension Maternal Grandmother   . Stroke Maternal Grandmother   . Lung cancer Maternal Grandfather   . Diabetes Maternal Grandfather   . Heart disease Maternal Grandfather   . Hypertension Maternal Grandfather   . Stroke Maternal Grandfather   . Hypertension Paternal Grandmother   . Heart disease Paternal Grandfather   . Hypertension Paternal Grandfather   . Autism Son   . Bipolar disorder Son   . Autism Son    Social History:  reports that he quit smoking about 11 years ago. His smoking use included cigarettes. He has never used smokeless tobacco. He reports current alcohol use. He reports current drug use. Drug: Marijuana.  Allergies:  Allergies  Allergen Reactions  . Sulfa Antibiotics     Medications Prior to Admission  Medication Sig Dispense Refill  . amLODipine (NORVASC) 10 MG tablet Take 1 tablet (10 mg total) by mouth daily. 90 tablet 1  . aspirin 325 MG tablet Take 325 mg by mouth every evening.     Marland Kitchen atorvastatin (LIPITOR) 80 MG tablet Take 1 tablet (80 mg total) by mouth daily. (Patient taking differently: Take 80 mg by mouth every evening. ) 90 tablet 1  . Cholecalciferol (DIALYVITE VITAMIN D 5000) 125 MCG (5000 UT) capsule  Take 5,000 Units by mouth daily.    . Cyanocobalamin (B-12) 5000 MCG CAPS Take 5,000 mcg by mouth daily.    . ertugliflozin L-PyroglutamicAc (STEGLATRO) 15 MG TABS tablet Take 1 tablet (15 mg total) by mouth daily. 30 tablet 3  . gabapentin (NEURONTIN) 600 MG tablet Take 1 tablet (600 mg total) by mouth daily. (Patient taking differently: Take 600 mg by mouth every evening. ) 90 tablet 1  . Garlic 1000 MG CAPS Take 1,000 mg by mouth 2 (two) times daily.    Marland Kitchen ibuprofen (ADVIL) 200 MG tablet Take 400 mg by mouth every 8 (eight) hours as needed for headache or moderate pain.    Boris Lown Oil 500 MG CAPS Take 500 mg by mouth daily.    Marland Kitchen levothyroxine (SYNTHROID) 75 MCG tablet Take 1 tablet (75 mcg total) by mouth daily before  breakfast. 90 tablet 1  . lisinopril (ZESTRIL) 20 MG tablet Take 1 tablet (20 mg total) by mouth daily. 90 tablet 1  . metFORMIN (GLUCOPHAGE) 500 MG tablet Take 1 tablet (500 mg total) by mouth 2 (two) times daily with a meal. 180 tablet 1  . Multiple Vitamin (MULTIVITAMIN ADULT PO) Take 1 tablet by mouth daily.     . OLOPATADINE HCL OP Place 1 drop into both eyes daily as needed (allergies).    Marland Kitchen omeprazole (PRILOSEC) 20 MG capsule Take 20 mg by mouth every evening.     Marland Kitchen spironolactone (ALDACTONE) 25 MG tablet Take 1 tablet (25 mg total) by mouth daily. 90 tablet 1  . TRULICITY 3 MG/0.5ML SOPN INJECT 0.5 MLS (3 MG TOTAL) AS DIRECTED ONCE A WEEK (Patient taking differently: Inject 3 mg as directed every Sunday. ) 0.5 mL 1    Results for orders placed or performed during the hospital encounter of 06/02/20 (from the past 48 hour(s))  Glucose, capillary     Status: Abnormal   Collection Time: 06/02/20  5:52 AM  Result Value Ref Range   Glucose-Capillary 135 (H) 70 - 99 mg/dL    Comment: Glucose reference range applies only to samples taken after fasting for at least 8 hours.  ABO/Rh     Status: None (Preliminary result)   Collection Time: 06/02/20  6:25 AM  Result Value Ref Range   ABO/RH(D) PENDING   Protime-INR     Status: None   Collection Time: 06/02/20  6:34 AM  Result Value Ref Range   Prothrombin Time 12.0 11.4 - 15.2 seconds   INR 0.9 0.8 - 1.2    Comment: (NOTE) INR goal varies based on device and disease states. Performed at [facility], [address] [place], [place] [address]    No results found.  ROS 14 point review of systems was obtained which all pertinent positives are listed in HPI above  Blood pressure (!) 151/80, pulse 83, temperature 98.4 F (36.9 C), temperature source Oral, resp. rate 18, height 5\' 10"  (1.778 m), weight (!) 149.7 kg, SpO2 97 %. Physical Exam  A&O x3 PERRLA Cranial nerves II through XII intact Right upper extremity 5/5 Left  upper extremity deltoid, bicep 4/5, tricep grip 5/5 Bilateral lower extremity 5/5 Sensory intact light touch  Assessment/Plan 59 year old male with  1.  C5, C6 radiculopathy 2.  Cervical spondylosis, C4-C6  -OR today for ACDF C4-C6 -Medically cleared -Has been off aspirin for 2 weeks  Thank you for allowing me to participate in this patient's care.  Please do not hesitate to call with questions or  concerns.   Monia Pouch, DO Neurosurgeon [facility] Cell: [phone]

## 2020-06-03 DIAGNOSIS — M4722 Other spondylosis with radiculopathy, cervical region: Secondary | ICD-10-CM | POA: Diagnosis not present

## 2020-06-03 LAB — BASIC METABOLIC PANEL
Anion gap: 13 (ref 5–15)
BUN: 17 mg/dL (ref 6–20)
CO2: 22 mmol/L (ref 22–32)
Calcium: 9 mg/dL (ref 8.9–10.3)
Chloride: 100 mmol/L (ref 98–111)
Creatinine, Ser: 0.96 mg/dL (ref 0.61–1.24)
GFR calc Af Amer: >60 mL/min (ref >60)
GFR calc non Af Amer: >60 mL/min (ref >60)
Glucose, Bld: 170 mg/dL — ABNORMAL HIGH (ref 70–99)
Potassium: 4 mmol/L (ref 3.5–5.1)
Sodium: 135 mmol/L (ref 135–145)

## 2020-06-03 LAB — GLUCOSE, CAPILLARY: Glucose-Capillary: 154 mg/dL — ABNORMAL HIGH (ref 70–99)

## 2020-06-03 MED ORDER — METHOCARBAMOL 500 MG PO TABS: 500.0000 mg | ORAL_TABLET | Freq: Three times a day (TID) | ORAL | 1 refills | Status: DC | PRN

## 2020-06-03 MED ORDER — HYDROCODONE-ACETAMINOPHEN 5-325 MG PO TABS: 1.0000 | ORAL_TABLET | ORAL | 0 refills | Status: DC | PRN

## 2020-06-03 MED FILL — Thrombin For Soln 5000 Unit: CUTANEOUS | Qty: 5000 | Status: AC

## 2020-06-03 NOTE — Progress Notes (Signed)
Patient alert and oriented, mae's well, voiding adequate amount of urine, swallowing without difficulty, no c/o pain at time of discharge. Patient discharged home with family. Script and discharged instructions given to patient. Patient and family stated understanding of instructions given. Patient has an appointment with Dr. Dawley 

## 2020-06-03 NOTE — Discharge Summary (Signed)
Physician Discharge Summary  Patient ID: Daniel Scott MRN: [id number] DOB/AGE: 1961/06/04 59 y.o.  Admit date: 06/02/2020 Discharge date: 06/03/2020  Admission Diagnoses:  1.  Left upper extremity C5, C6 radiculopathy 2.  Cervical spondylosis C4-C6  Discharge Diagnoses:  Same Active Problems:   Cervical radiculopathy at C5   Discharged Condition: Stable  Hospital Course:  Daniel Scott is a 59 y.o. male who underwent the below surgery, ACDF C4-6 on 06/02/2020.  He tolerated the surgery well.  He was monitored overnight in the hospital and was ambulating postoperative day 0.  His pain postoperatively was controlled with oral medication and his preoperative left upper extremity radiculopathy is completely resolved.  He was tolerating a normal diet, having normal bowel and bladder function.  He was seen by physical therapy and Occupational Therapy and agreed he can go home with family support.  His Hemovac drain was removed on postoperative day 0.  Treatments: Surgery -ACDF C4-C6, 06/02/2020  Discharge Exam: Blood pressure 127/79, pulse 81, temperature 97.6 F (36.4 C), temperature source Oral, resp. rate 18, height 5\' 10"  (1.778 m), weight (!) 149.7 kg, SpO2 98 %. Awake, alert, orientedx3 Speech fluent, appropriate CNs grossly intact 5/5 BUE/BLE Wound c/d/i Trachea midline Normal phonation Neck soft  Disposition:   Discharge Instructions    Incentive spirometry RT   Complete by: As directed      Allergies as of 06/03/2020      Reactions   Sulfa Antibiotics       Medication List    STOP taking these medications   aspirin 325 MG tablet     TAKE these medications   amLODipine 10 MG tablet Commonly known as: NORVASC Take 1 tablet (10 mg total) by mouth daily.   atorvastatin 80 MG tablet Commonly known as: LIPITOR Take 1 tablet (80 mg total) by mouth daily. What changed: when to take this   B-12 5000 MCG Caps Take 5,000 mcg by mouth daily.    Dialyvite Vitamin D 5000 125 MCG (5000 UT) capsule Generic drug: Cholecalciferol Take 5,000 Units by mouth daily.   gabapentin 600 MG tablet Commonly known as: NEURONTIN Take 1 tablet (600 mg total) by mouth daily. What changed: when to take this   Garlic 1000 MG Caps Take 1,000 mg by mouth 2 (two) times daily.   HYDROcodone-acetaminophen 5-325 MG tablet Commonly known as: NORCO/VICODIN Take 1-2 tablets by mouth every 4 (four) hours as needed for moderate pain ((score 4 to 6)).   ibuprofen 200 MG tablet Commonly known as: ADVIL Take 400 mg by mouth every 8 (eight) hours as needed for headache or moderate pain.   Krill Oil 500 MG Caps Take 500 mg by mouth daily.   levothyroxine 75 MCG tablet Commonly known as: SYNTHROID Take 1 tablet (75 mcg total) by mouth daily before breakfast.   lisinopril 20 MG tablet Commonly known as: ZESTRIL Take 1 tablet (20 mg total) by mouth daily.   metFORMIN 500 MG tablet Commonly known as: GLUCOPHAGE Take 1 tablet (500 mg total) by mouth 2 (two) times daily with a meal.   methocarbamol 500 MG tablet Commonly known as: ROBAXIN Take 1 tablet (500 mg total) by mouth every 8 (eight) hours as needed for muscle spasms.   MULTIVITAMIN ADULT PO Take 1 tablet by mouth daily.   OLOPATADINE HCL OP Place 1 drop into both eyes daily as needed (allergies).   omeprazole 20 MG capsule Commonly known as: PRILOSEC Take 20 mg by mouth  every evening.   spironolactone 25 MG tablet Commonly known as: ALDACTONE Take 1 tablet (25 mg total) by mouth daily.   Steglatro 15 MG Tabs tablet Generic drug: ertugliflozin L-PyroglutamicAc Take 1 tablet (15 mg total) by mouth daily.   Trulicity 3 MG/0.5ML Sopn Generic drug: Dulaglutide INJECT 0.5 MLS (3 MG TOTAL) AS DIRECTED ONCE A WEEK What changed: when to take this       Follow-up Information    [name] C, DO Follow up in 1 week(s).   Why: call for appointment for staple removal Contact  information: [address] [place] [address] [place] [address] [phone]               Signed: Alan Mulder Ron Beske 06/03/2020, 9:21 AM

## 2020-06-03 NOTE — Evaluation (Signed)
Occupational Therapy Evaluation Patient Details Name: Daniel Scott MRN: [id number] DOB: 28-Apr-1961 Today's Date: 06/03/2020    History of Present Illness Pt is a 59 y/o male who presents s/p C4-C6 ACDF on 06/02/2020. PMH significant for DM, spinal stenosis, HTN, hypothyroidism.    Clinical Impression   Patient evaluated by Occupational Therapy with no further acute OT needs identified. All education has been completed and the patient has no further questions. See below for any follow-up Occupational Therapy or equipment needs. OT to sign off. Thank you for referral.   Pt able to don doff brace indep     Follow Up Recommendations  No OT follow up    Equipment Recommendations  None recommended by OT    Recommendations for Other Services       Precautions / Restrictions Precautions Precautions: Fall;Cervical Precaution Booklet Issued: Yes (comment) Precaution Comments: handout in room and reviewed for adls. Required Braces or Orthoses: Cervical Brace Cervical Brace: Hard collar;At all times Restrictions Weight Bearing Restrictions: No      Mobility Bed Mobility Overal bed mobility: Modified Independent             General bed mobility comments: oob in chair on arrival  Transfers Overall transfer level: Modified independent Equipment used: None             General transfer comment: Pt demonstrated proper hand placement on seated surface for safety. No unsteadiness or LOB noted upon stand.     Balance Overall balance assessment: Mild deficits observed, not formally tested                                         ADL either performed or assessed with clinical judgement   ADL Overall ADL's : Modified independent                                       General ADL Comments: able to don doff brace, educated on oral care, facial grooming, washing around incision, use of clean linen every time, medication management with  alarm use, positioning for sleeping, use of collar at all times, how to correct position when out of collar for shower     Vision Baseline Vision/History: Wears glasses Wears Glasses: At all times       Perception     Praxis      Pertinent Vitals/Pain Pain Assessment: Faces Faces Pain Scale: Hurts a little bit Pain Location: Anterior cervical area (pt reports as muscular pain in upper respiratory area), and incision site Pain Descriptors / Indicators: Operative site guarding;Grimacing Pain Intervention(s): Monitored during session;Premedicated before session;Repositioned     Hand Dominance Left   Extremity/Trunk Assessment Upper Extremity Assessment Upper Extremity Assessment: Overall WFL for tasks assessed LUE Deficits / Details: denies any numbness or changes. reports "normal when assessed LUE Sensation: WNL LUE Coordination: WNL   Lower Extremity Assessment Lower Extremity Assessment: Defer to PT evaluation LLE Deficits / Details: Likely baseline - mild gait deviations and RLE external rotation. Pt reports right before surgery he was having burning pain in anterior L thigh but no symptoms this morning.  LLE Sensation: WNL   Cervical / Trunk Assessment Cervical / Trunk Assessment: Other exceptions Cervical / Trunk Exceptions: s/p surgery   Communication Communication Communication: No difficulties   Cognition Arousal/Alertness:  Awake/alert Behavior During Therapy: WFL for tasks assessed/performed Overall Cognitive Status: Within Functional Limits for tasks assessed                                     General Comments  incision dry and intact at this time    Exercises     Shoulder Instructions      Home Living Family/patient expects to be discharged to:: Private residence Living Arrangements: Spouse/significant other;Children Available Help at Discharge: Family;Available 24 hours/day Type of Home: House Home Access: Level entry     Home  Layout: One level     Bathroom Shower/Tub: Producer, television/film/video: Handicapped height (Comfort height)     Home Equipment: Walker - standard;Walker - 4 wheels;Cane - quad;Cane - single point;Adaptive equipment Adaptive Equipment: Reacher Additional Comments: x4 cats in the home - educated on use of blanket or towel to prevent being scratched by animal       Prior Functioning/Environment Level of Independence: Independent        Comments: Works as a Environmental manager Problem List:        OT Treatment/Interventions:      OT Goals(Current goals can be found in the care plan section) Acute Rehab OT Goals Patient Stated Goal: Home today, back to work by 06/15/2020 Potential to Achieve Goals: Good  OT Frequency:     Barriers to D/C:            Co-evaluation              AM-PAC OT "6 Clicks" Daily Activity     Outcome Measure Help from another person eating meals?: None Help from another person taking care of personal grooming?: None Help from another person toileting, which includes using toliet, bedpan, or urinal?: None Help from another person bathing (including washing, rinsing, drying)?: None Help from another person to put on and taking off regular upper body clothing?: None Help from another person to put on and taking off regular lower body clothing?: None 6 Click Score: 24   End of Session Equipment Utilized During Treatment: Cervical collar Nurse Communication: Mobility status;Precautions  Activity Tolerance: Patient tolerated treatment well Patient left: in chair;with call bell/phone within reach  OT Visit Diagnosis: Muscle weakness (generalized) (M62.81)                Time: 2130-8657 OT Time Calculation (min): 13 min Charges:  OT General Charges $OT Visit: 1 Visit OT Evaluation $OT Eval Low Complexity: 1 Low   Brynn, OTR/L  Acute Rehabilitation Services Pager: [phone] Office:  [phone] .   Mateo Flow 06/03/2020, 9:51 AM

## 2020-06-03 NOTE — Discharge Instructions (Signed)
Wound Care Remove dressing in 2-3 days Leave incision open to air. You may shower. Do not scrub directly on incision.  Do not put any creams, lotions, or ointments on incision. Activity Walk each and every day, increasing distance each day. No lifting greater than 5 lbs.  Avoid excessive neck motion. No driving for 2 weeks; may ride as a passenger locally. Wear neck brace at all times except when showering.  If provided soft collar, may wear for comfort unless otherwise instructed. Diet Resume your normal diet.  Return to Work Will be discussed at you follow up appointment. Call Your Doctor If Any of These Occur Redness, drainage, or swelling at the wound.  Temperature greater than 101 degrees. Severe pain not relieved by pain medication. Increased difficulty swallowing. Incision starts to come apart. Follow Up Appt Call today for appointment in 1 weeks ([phone]) or for problems.  If you have any hardware placed in your spine, you will need an x-ray before your appointment.

## 2020-06-03 NOTE — Evaluation (Signed)
Physical Therapy Evaluation Patient Details Name: Daniel Scott MRN: [id number] DOB: June 24, 1961 Today's Date: 06/03/2020   History of Present Illness  Pt is a 59 y/o male who presents s/p C4-C6 ACDF on 06/02/2020. PMH significant for DM, spinal stenosis, HTN, hypothyroidism.     Clinical Impression  Pt admitted with above diagnosis. At the time of PT eval, pt was able to demonstrate transfers and ambulation with gross modified independence to supervision for safety without an AD. Pt was educated on precautions, brace application/wearing schedule, appropriate activity progression, and car transfer. Pt currently with functional limitations due to the deficits listed below (see PT Problem List). Pt will benefit from skilled PT to increase their independence and safety with mobility to allow discharge to the venue listed below.      Follow Up Recommendations No PT follow up;Supervision - Intermittent    Equipment Recommendations  None recommended by PT    Recommendations for Other Services       Precautions / Restrictions Precautions Precautions: Fall;Cervical Precaution Booklet Issued: Yes (comment) Precaution Comments: Reviewed handout briefly, pt was educated/cued for precautions during functional mobility.  Required Braces or Orthoses: Cervical Brace Cervical Brace: Hard collar;At all times Restrictions Weight Bearing Restrictions: No      Mobility  Bed Mobility Overal bed mobility: Modified Independent             General bed mobility comments: HOB elevated and pt was able to transition around to EOB without difficulty. Pt plans to sleep in the recliner upon return home. Verbally reviewed log roll.   Transfers Overall transfer level: Modified independent Equipment used: None             General transfer comment: Pt demonstrated proper hand placement on seated surface for safety. No unsteadiness or LOB noted upon stand.   Ambulation/Gait Ambulation/Gait  assistance: Supervision Gait Distance (Feet): 500 Feet Assistive device: None Gait Pattern/deviations: Step-through pattern;Wide base of support Gait velocity: Decreased Gait velocity interpretation: 1.31 - 2.62 ft/sec, indicative of limited community ambulator General Gait Details: Pt ambulating fairly well with mild gait deviations noted. RLE externally rotated with swing through to heel strike, and large amplitude lateral sway.   Stairs            Wheelchair Mobility    Modified Rankin (Stroke Patients Only)       Balance Overall balance assessment: Mild deficits observed, not formally tested                                           Pertinent Vitals/Pain Pain Assessment: Faces Faces Pain Scale: Hurts a little bit Pain Location: Anterior cervical area (pt reports as muscular pain in upper respiratory area), and incision site Pain Descriptors / Indicators: Operative site guarding;Grimacing Pain Intervention(s): Monitored during session;Repositioned    Home Living Family/patient expects to be discharged to:: Private residence Living Arrangements: Spouse/significant other;Children Available Help at Discharge: Family;Available 24 hours/day Type of Home: House Home Access: Level entry     Home Layout: One level Home Equipment: Walker - standard;Walker - 4 wheels;Cane - quad;Cane - single point;Adaptive equipment      Prior Function Level of Independence: Independent         Comments: Works as a [employer] fast streamer        Extremity/Trunk Assessment   Upper Extremity Assessment  Upper Extremity Assessment: LUE deficits/detail LUE Deficits / Details: Pt reports numbness and tingling prior to surgery, but no symptoms this morning.  LUE Sensation: WNL LUE Coordination: WNL    Lower Extremity Assessment Lower Extremity Assessment: Overall WFL for tasks assessed;LLE deficits/detail LLE Deficits /  Details: Likely baseline - mild gait deviations and RLE external rotation. Pt reports right before surgery he was having burning pain in anterior L thigh but no symptoms this morning.  LLE Sensation: WNL    Cervical / Trunk Assessment Cervical / Trunk Assessment: Other exceptions Cervical / Trunk Exceptions: s/p surgery  Communication   Communication: No difficulties  Cognition Arousal/Alertness: Awake/alert Behavior During Therapy: WFL for tasks assessed/performed Overall Cognitive Status: Within Functional Limits for tasks assessed                                        General Comments      Exercises     Assessment/Plan    PT Assessment Patient needs continued PT services  PT Problem List Decreased strength;Decreased activity tolerance;Decreased balance;Decreased mobility;Decreased knowledge of use of DME;Decreased safety awareness;Pain;Decreased knowledge of precautions       PT Treatment Interventions DME instruction;Gait training;Stair training;Functional mobility training;Therapeutic activities;Therapeutic exercise;Neuromuscular re-education;Patient/family education;Balance training    PT Goals (Current goals can be found in the Care Plan section)  Acute Rehab PT Goals Patient Stated Goal: Home today, back to work by 06/15/2020 PT Goal Formulation: With patient Time For Goal Achievement: 06/10/20 Potential to Achieve Goals: Good    Frequency Min 5X/week   Barriers to discharge        Co-evaluation               AM-PAC PT "6 Clicks" Mobility  Outcome Measure Help needed turning from your back to your side while in a flat bed without using bedrails?: None Help needed moving from lying on your back to sitting on the side of a flat bed without using bedrails?: None Help needed moving to and from a bed to a chair (including a wheelchair)?: None Help needed standing up from a chair using your arms (e.g., wheelchair or bedside chair)?:  None Help needed to walk in hospital room?: None Help needed climbing 3-5 steps with a railing? : A Little 6 Click Score: 23    End of Session Equipment Utilized During Treatment: Gait belt;Cervical collar Activity Tolerance: Patient tolerated treatment well Patient left: in chair;with call bell/phone within reach Nurse Communication: Mobility status PT Visit Diagnosis: Unsteadiness on feet (R26.81);Pain Pain - part of body:  (neck)    Time: 6759-1638 PT Time Calculation (min) (ACUTE ONLY): 19 min   Charges:   PT Evaluation $PT Eval Low Complexity: 1 Low          Conni Slipper, PT, DPT Acute Rehabilitation Services Pager: [phone] Office: [phone]   Marylynn Pearson 06/03/2020, 9:19 AM

## 2020-06-04 ENCOUNTER — Encounter (HOSPITAL_COMMUNITY): Payer: Self-pay | Admitting: Neurological Surgery

## 2020-06-11 ENCOUNTER — Encounter: Payer: Self-pay | Admitting: Family Medicine

## 2020-06-11 ENCOUNTER — Other Ambulatory Visit: Payer: Self-pay

## 2020-06-11 ENCOUNTER — Ambulatory Visit (INDEPENDENT_AMBULATORY_CARE_PROVIDER_SITE_OTHER): Payer: PRIVATE HEALTH INSURANCE | Admitting: Family Medicine

## 2020-06-11 ENCOUNTER — Ambulatory Visit: Payer: PRIVATE HEALTH INSURANCE | Admitting: Family Medicine

## 2020-06-11 VITALS — BP 134/88 | HR 103 | Temp 95.9°F | Ht 70.0 in | Wt 321.0 lb

## 2020-06-11 DIAGNOSIS — E079 Disorder of thyroid, unspecified: Secondary | ICD-10-CM

## 2020-06-11 DIAGNOSIS — Z0001 Encounter for general adult medical examination with abnormal findings: Secondary | ICD-10-CM | POA: Diagnosis not present

## 2020-06-11 DIAGNOSIS — Z23 Encounter for immunization: Secondary | ICD-10-CM

## 2020-06-11 DIAGNOSIS — K219 Gastro-esophageal reflux disease without esophagitis: Secondary | ICD-10-CM | POA: Diagnosis not present

## 2020-06-11 DIAGNOSIS — E1165 Type 2 diabetes mellitus with hyperglycemia: Secondary | ICD-10-CM

## 2020-06-11 DIAGNOSIS — E782 Mixed hyperlipidemia: Secondary | ICD-10-CM

## 2020-06-11 DIAGNOSIS — Z794 Long term (current) use of insulin: Secondary | ICD-10-CM

## 2020-06-11 DIAGNOSIS — Z0000 Encounter for general adult medical examination without abnormal findings: Secondary | ICD-10-CM

## 2020-06-11 DIAGNOSIS — I10 Essential (primary) hypertension: Secondary | ICD-10-CM

## 2020-06-11 MED ORDER — GABAPENTIN 600 MG PO TABS: 600.0000 mg | ORAL_TABLET | Freq: Every day | ORAL | 1 refills | Status: DC

## 2020-06-11 MED ORDER — STEGLATRO 15 MG PO TABS: 15.0000 mg | ORAL_TABLET | Freq: Every day | ORAL | 1 refills | Status: DC

## 2020-06-11 MED ORDER — METHOCARBAMOL 500 MG PO TABS: 500.0000 mg | ORAL_TABLET | Freq: Three times a day (TID) | ORAL | 1 refills | Status: DC | PRN

## 2020-06-11 MED ORDER — LEVOTHYROXINE SODIUM 75 MCG PO TABS: 75.0000 ug | ORAL_TABLET | Freq: Every day | ORAL | 1 refills | Status: DC

## 2020-06-11 MED ORDER — LISINOPRIL 20 MG PO TABS: 20.0000 mg | ORAL_TABLET | Freq: Every day | ORAL | 1 refills | Status: DC

## 2020-06-11 MED ORDER — AMLODIPINE BESYLATE 10 MG PO TABS: 10.0000 mg | ORAL_TABLET | Freq: Every day | ORAL | 1 refills | Status: DC

## 2020-06-11 MED ORDER — ATORVASTATIN CALCIUM 80 MG PO TABS: 80.0000 mg | ORAL_TABLET | Freq: Every day | ORAL | 1 refills | Status: DC

## 2020-06-11 MED ORDER — SPIRONOLACTONE 25 MG PO TABS: 25.0000 mg | ORAL_TABLET | Freq: Every day | ORAL | 1 refills | Status: DC

## 2020-06-11 MED ORDER — OMEPRAZOLE 20 MG PO CPDR: 20.0000 mg | DELAYED_RELEASE_CAPSULE | Freq: Every evening | ORAL | 1 refills | Status: DC

## 2020-06-11 MED ORDER — METFORMIN HCL 500 MG PO TABS: 500.0000 mg | ORAL_TABLET | Freq: Two times a day (BID) | ORAL | 1 refills | Status: DC

## 2020-06-11 NOTE — Progress Notes (Signed)
Assessment & Plan:  1. Well adult exam - Preventive health education provided. Patient declined PNA vaccine.   2. Type 2 diabetes mellitus with hyperglycemia, with long-term current use of insulin (HCC) Lab Results  Component Value Date   HGBA1C 6.8 (H) 05/29/2020   HGBA1C 7.1 (H) 03/10/2020   HGBA1C 7.0 (H) 12/09/2019  - Diabetes is at goal of A1c < 7. - Medications: continue current medications - Patient is currently taking a statin. Patient is taking an ACE-inhibitor/ARB.  - Last foot exam: 03/10/2020 - Last diabetic eye exam: recent per patient - he reports records are coming to [place] - Urine Microalbumin/Creat Ratio: 03/10/2020 - atorvastatin (LIPITOR) 80 MG tablet; Take 1 tablet (80 mg total) by mouth daily.  Dispense: 90 tablet; Refill: 1 - ertugliflozin L-PyroglutamicAc (STEGLATRO) 15 MG TABS tablet; Take 1 tablet (15 mg total) by mouth daily.  Dispense: 90 tablet; Refill: 1 - lisinopril (ZESTRIL) 20 MG tablet; Take 1 tablet (20 mg total) by mouth daily.  Dispense: 90 tablet; Refill: 1 - metFORMIN (GLUCOPHAGE) 500 MG tablet; Take 1 tablet (500 mg total) by mouth 2 (two) times daily with a meal.  Dispense: 180 tablet; Refill: 1  3. Morbid obesity (HCC) - Encouraged diet and exercise. He has lost 17 lbs in the past 3 months.   4. Gastroesophageal reflux disease, unspecified whether esophagitis present - Well controlled on current regimen.  - omeprazole (PRILOSEC) 20 MG capsule; Take 1 capsule (20 mg total) by mouth every evening.  Dispense: 90 capsule; Refill: 1  5. Essential hypertension - Well controlled on current regimen.  - amLODipine (NORVASC) 10 MG tablet; Take 1 tablet (10 mg total) by mouth daily.  Dispense: 90 tablet; Refill: 1 - lisinopril (ZESTRIL) 20 MG tablet; Take 1 tablet (20 mg total) by mouth daily.  Dispense: 90 tablet; Refill: 1 - spironolactone (ALDACTONE) 25 MG tablet; Take 1 tablet (25 mg total) by mouth daily.  Dispense: 90 tablet; Refill: 1  6. Mixed  hyperlipidemia - Well controlled on current regimen.  - atorvastatin (LIPITOR) 80 MG tablet; Take 1 tablet (80 mg total) by mouth daily.  Dispense: 90 tablet; Refill: 1  7. Thyroid disease - Well controlled on current regimen.  - levothyroxine (SYNTHROID) 75 MCG tablet; Take 1 tablet (75 mcg total) by mouth daily before breakfast.  Dispense: 90 tablet; Refill: 1  8. Need for immunization against influenza - Flu Vaccine QUAD 36+ mos IM   Follow-up: Return in about 3 months (around 09/11/2020) for DM.   [name], MSN, APRN, FNP-C [facility]  Subjective:  Patient ID: Daniel Scott, male    DOB: Sep 09, 1960  Age: 59 y.o. MRN: [id number]  Patient Care Team: Gwenlyn Fudge, FNP as PCP - General (Family Medicine)   CC:  Chief Complaint  Patient presents with  . Annual Exam    HPI Daniel Scott presents for his annual physical.   Marital status: married, Substance use: marijuana Last colonoscopy: 07/06/2017 Hepatitis C Screening: 03/10/2020 Immunizations: Flu Vaccine: getting today Tdap Vaccine: up to date  Shingrix Vaccine: patient reports he is UTD, we do not have this record  COVID-19 Vaccine: up to date Pneumonia Vaccine: declined  DEPRESSION SCREENING PHQ 2/9 Scores 06/11/2020 03/10/2020 12/09/2019  PHQ - 2 Score 0 0 0  PHQ- 9 Score - 0 2     Patient just had neck surgery on 06/02/2020. He reports he is doing well and the pain for which he had the  surgery for is gone. He is having normal postop pain that is being managed by his neurosurgeon.   Patient has lost 17 lbs since our last visit 3 months ago. He does feel some of this is since his surgery because he has not been eating as much with the neck brace on.  Diabetes: Patient presents for follow up of diabetes. Known diabetic complications: none. Medication compliance: yes. Is he  on ACE inhibitor or angiotensin II receptor blocker? Yes. Is he on a statin? Yes.   Lab Results  Component  Value Date   HGBA1C 6.8 (H) 05/29/2020   HGBA1C 7.1 (H) 03/10/2020   HGBA1C 7.0 (H) 12/09/2019   Lab Results  Component Value Date   LDLCALC 67 12/09/2019   CREATININE 0.96 06/03/2020     Review of Systems  Constitutional: Negative for chills, fever, malaise/fatigue and weight loss.  HENT: Negative for congestion, ear discharge, ear pain, nosebleeds, sinus pain, sore throat and tinnitus.   Eyes: Negative for blurred vision, double vision, pain, discharge and redness.  Respiratory: Negative for cough, shortness of breath and wheezing.   Cardiovascular: Negative for chest pain, palpitations and leg swelling.  Gastrointestinal: Negative for abdominal pain, constipation, diarrhea, heartburn, nausea and vomiting.  Genitourinary: Negative for dysuria, frequency and urgency.  Musculoskeletal: Negative for myalgias.  Skin: Negative for rash.  Neurological: Negative for dizziness, seizures, weakness and headaches.  Psychiatric/Behavioral: Negative for depression, substance abuse and suicidal ideas. The patient is not nervous/anxious.      Current Outpatient Medications:  .  amLODipine (NORVASC) 10 MG tablet, Take 1 tablet (10 mg total) by mouth daily., Disp: 90 tablet, Rfl: 1 .  atorvastatin (LIPITOR) 80 MG tablet, Take 1 tablet (80 mg total) by mouth daily. (Patient taking differently: Take 80 mg by mouth every evening. ), Disp: 90 tablet, Rfl: 1 .  Cholecalciferol (DIALYVITE VITAMIN D 5000) 125 MCG (5000 UT) capsule, Take 5,000 Units by mouth daily., Disp: , Rfl:  .  Cyanocobalamin (B-12) 5000 MCG CAPS, Take 5,000 mcg by mouth daily., Disp: , Rfl:  .  ertugliflozin L-PyroglutamicAc (STEGLATRO) 15 MG TABS tablet, Take 1 tablet (15 mg total) by mouth daily., Disp: 30 tablet, Rfl: 3 .  gabapentin (NEURONTIN) 600 MG tablet, Take 1 tablet (600 mg total) by mouth daily. (Patient taking differently: Take 600 mg by mouth every evening. ), Disp: 90 tablet, Rfl: 1 .  Garlic 1000 MG CAPS, Take 1,000  mg by mouth 2 (two) times daily., Disp: , Rfl:  .  HYDROcodone-acetaminophen (NORCO/VICODIN) 5-325 MG tablet, Take 1-2 tablets by mouth every 4 (four) hours as needed for moderate pain ((score 4 to 6))., Disp: 30 tablet, Rfl: 0 .  ibuprofen (ADVIL) 200 MG tablet, Take 400 mg by mouth every 8 (eight) hours as needed for headache or moderate pain., Disp: , Rfl:  .  Krill Oil 500 MG CAPS, Take 500 mg by mouth daily., Disp: , Rfl:  .  levothyroxine (SYNTHROID) 75 MCG tablet, Take 1 tablet (75 mcg total) by mouth daily before breakfast., Disp: 90 tablet, Rfl: 1 .  lisinopril (ZESTRIL) 20 MG tablet, Take 1 tablet (20 mg total) by mouth daily., Disp: 90 tablet, Rfl: 1 .  metFORMIN (GLUCOPHAGE) 500 MG tablet, Take 1 tablet (500 mg total) by mouth 2 (two) times daily with a meal., Disp: 180 tablet, Rfl: 1 .  methocarbamol (ROBAXIN) 500 MG tablet, Take 1 tablet (500 mg total) by mouth every 8 (eight) hours as needed for muscle spasms., Disp:  60 tablet, Rfl: 1 .  Multiple Vitamin (MULTIVITAMIN ADULT PO), Take 1 tablet by mouth daily. , Disp: , Rfl:  .  OLOPATADINE HCL OP, Place 1 drop into both eyes daily as needed (allergies)., Disp: , Rfl:  .  omeprazole (PRILOSEC) 20 MG capsule, Take 20 mg by mouth every evening. , Disp: , Rfl:  .  spironolactone (ALDACTONE) 25 MG tablet, Take 1 tablet (25 mg total) by mouth daily., Disp: 90 tablet, Rfl: 1 .  TRULICITY 3 MG/0.5ML SOPN, INJECT 0.5 MLS (3 MG TOTAL) AS DIRECTED ONCE A WEEK (Patient taking differently: Inject 3 mg as directed every Sunday. ), Disp: 0.5 mL, Rfl: 1  Allergies  Allergen Reactions  . Sulfa Antibiotics     Past Medical History:  Diagnosis Date  . Arthritis    hands and feet  . Depression   . Diabetes mellitus without complication (HCC)    type 2  . Fatty liver   . GERD (gastroesophageal reflux disease)   . Hiatal hernia   . Hyperlipidemia   . Hypertension   . Hypothyroidism   . Neuromuscular disorder (HCC)    stenosis  . Pneumonia  1990  . Stenosis of cervical spine   . Thyroid disease     Past Surgical History:  Procedure Laterality Date  . ANTERIOR CERVICAL DECOMP/DISCECTOMY FUSION N/A 06/02/2020   Procedure: ANTERIOR CERVICAL DECOMPRESSION/DISCECTOMY FUSIONCERVICAL FOUR- CERVICAL FIVE, CERVICAL FIVE- CERVICAL SIX;  Surgeon: Dawley, Alan Mulder, DO;  Location: [facility] OR;  Service: Neurosurgery;  Laterality: N/A;  ANTERIOR CERVICAL DECOMPRESSION/DISCECTOMY FUSIONCERVICAL FOUR- CERVICAL FIVE, CERVICAL FIVE- CERVICAL SIX  . CARDIAC CATHETERIZATION  1996; 2006   [facility]  . HYDROCELE EXCISION    . LAPAROSCOPIC GASTRIC BANDING    . LAPAROSCOPIC ROUX-EN-Y GASTRIC BYPASS WITH UPPER ENDOSCOPY AND REMOVAL OF LAP BAND  02/2012  . TONSILLECTOMY      Family History  Problem Relation Age of Onset  . Vascular Disease Mother   . Cancer Mother        Bone marrow  . Kidney disease Mother   . Thyroid disease Mother   . Hypertension Mother   . Diabetes Mother   . Coronary artery disease Father   . Colon cancer Father   . Heart disease Father   . Hyperlipidemia Father   . Hypertension Father   . Spina bifida Daughter   . Depression Son   . Anxiety disorder Son   . Hypertension Son   . Breast cancer Maternal Grandmother   . Hypertension Maternal Grandmother   . Stroke Maternal Grandmother   . Lung cancer Maternal Grandfather   . Diabetes Maternal Grandfather   . Heart disease Maternal Grandfather   . Hypertension Maternal Grandfather   . Stroke Maternal Grandfather   . Hypertension Paternal Grandmother   . Heart disease Paternal Grandfather   . Hypertension Paternal Grandfather   . Autism Son   . Bipolar disorder Son   . Autism Son     Social History   Socioeconomic History  . Marital status: Married    Spouse name: Not on file  . Number of children: Not on file  . Years of education: Not on file  . Highest education level: Not on file  Occupational History  . Not on file  Tobacco Use  .  Smoking status: Former Smoker    Types: Cigarettes    Quit date: 05/2009    Years since quitting: 11.1  . Smokeless tobacco: Never Used  Vaping Use  .  Vaping Use: Never used  Substance and Sexual Activity  . Alcohol use: Yes    Comment: daily  . Drug use: Yes    Types: Marijuana    Comment: occ  . Sexual activity: Yes    Birth control/protection: None  Other Topics Concern  . Not on file  Social History Narrative  . Not on file   Social Determinants of Health   Financial Resource Strain:   . Difficulty of Paying Living Expenses: Not on file  Food Insecurity:   . Worried About Programme researcher, broadcasting/film/video in the Last Year: Not on file  . Ran Out of Food in the Last Year: Not on file  Transportation Needs:   . Lack of Transportation (Medical): Not on file  . Lack of Transportation (Non-Medical): Not on file  Physical Activity:   . Days of Exercise per Week: Not on file  . Minutes of Exercise per Session: Not on file  Stress:   . Feeling of Stress : Not on file  Social Connections:   . Frequency of Communication with Friends and Family: Not on file  . Frequency of Social Gatherings with Friends and Family: Not on file  . Attends Religious Services: Not on file  . Active Member of Clubs or Organizations: Not on file  . Attends Banker Meetings: Not on file  . Marital Status: Not on file  Intimate Partner Violence:   . Fear of Current or Ex-Partner: Not on file  . Emotionally Abused: Not on file  . Physically Abused: Not on file  . Sexually Abused: Not on file      Objective:    BP 134/88   Pulse (!) 103   Temp (!) 95.9 F (35.5 C) (Temporal)   Ht 5\' 10"  (1.778 m)   Wt (!) 321 lb (145.6 kg)   SpO2 96%   BMI 46.06 kg/m   Wt Readings from Last 3 Encounters:  06/11/20 (!) 321 lb (145.6 kg)  06/02/20 (!) 330 lb (149.7 kg)  05/29/20 (!) 334 lb 12.8 oz (151.9 kg)    Physical Exam Vitals reviewed.  Constitutional:      General: He is not in acute  distress.    Appearance: Normal appearance. He is morbidly obese. He is not ill-appearing, toxic-appearing or diaphoretic.  HENT:     Head: Normocephalic and atraumatic.     Right Ear: Tympanic membrane, ear canal and external ear normal. There is no impacted cerumen.     Left Ear: Tympanic membrane, ear canal and external ear normal. There is no impacted cerumen.     Nose: Nose normal. No congestion or rhinorrhea.     Mouth/Throat:     Mouth: Mucous membranes are moist.     Pharynx: Oropharynx is clear. No oropharyngeal exudate or posterior oropharyngeal erythema.  Eyes:     General: No scleral icterus.       Right eye: No discharge.        Left eye: No discharge.     Conjunctiva/sclera: Conjunctivae normal.     Pupils: Pupils are equal, round, and reactive to light.  Neck:     Comments: Wearing neck brace s/p surgery. Cardiovascular:     Rate and Rhythm: Normal rate and regular rhythm.     Heart sounds: Normal heart sounds. No murmur heard.  No friction rub. No gallop.   Pulmonary:     Effort: Pulmonary effort is normal. No respiratory distress.     Breath sounds: Normal breath  sounds. No stridor. No wheezing, rhonchi or rales.  Abdominal:     General: Abdomen is flat. Bowel sounds are normal. There is no distension.     Palpations: Abdomen is soft. There is no hepatomegaly, splenomegaly or mass.     Tenderness: There is no abdominal tenderness. There is no guarding or rebound.     Hernia: No hernia is present.  Musculoskeletal:        General: Normal range of motion.     Cervical back: Neck supple. No rigidity. No muscular tenderness.     Right lower leg: No edema.     Left lower leg: No edema.  Lymphadenopathy:     Cervical: No cervical adenopathy.  Skin:    General: Skin is warm and dry.     Capillary Refill: Capillary refill takes less than 2 seconds.  Neurological:     General: No focal deficit present.     Mental Status: He is alert and oriented to person, place, and  time. Mental status is at baseline.  Psychiatric:        Mood and Affect: Mood normal.        Behavior: Behavior normal.        Thought Content: Thought content normal.        Judgment: Judgment normal.     Lab Results  Component Value Date   TSH 2.850 12/09/2019   Lab Results  Component Value Date   WBC 11.6 (H) 06/02/2020   HGB 14.5 06/02/2020   HCT 44.0 06/02/2020   MCV 94.0 06/02/2020   PLT 238 06/02/2020   Lab Results  Component Value Date   NA 135 06/03/2020   K 4.0 06/03/2020   CO2 22 06/03/2020   GLUCOSE 170 (H) 06/03/2020   BUN 17 06/03/2020   CREATININE 0.96 06/03/2020   BILITOT 0.7 05/29/2020   ALKPHOS 61 05/29/2020   AST 51 (H) 05/29/2020   ALT 65 (H) 05/29/2020   PROT 6.6 05/29/2020   ALBUMIN 4.1 05/29/2020   CALCIUM 9.0 06/03/2020   ANIONGAP 13 06/03/2020   Lab Results  Component Value Date   CHOL 136 12/09/2019   Lab Results  Component Value Date   HDL 45 12/09/2019   Lab Results  Component Value Date   LDLCALC 67 12/09/2019   Lab Results  Component Value Date   TRIG 140 12/09/2019   Lab Results  Component Value Date   CHOLHDL 3.0 12/09/2019   Lab Results  Component Value Date   HGBA1C 6.8 (H) 05/29/2020

## 2020-06-11 NOTE — Patient Instructions (Signed)
Preventive Care 41-61 Years Old, Male Preventive care refers to lifestyle choices and visits with your health care provider that can promote health and wellness. This includes:  A yearly physical exam. This is also called an annual well check.  Regular dental and eye exams.  Immunizations.  Screening for certain conditions.  Healthy lifestyle choices, such as eating a healthy diet, getting regular exercise, not using drugs or products that contain nicotine and tobacco, and limiting alcohol use. What can I expect for my preventive care visit? Physical exam Your health care provider will check:  Height and weight. These may be used to calculate body mass index (BMI), which is a measurement that tells if you are at a healthy weight.  Heart rate and blood pressure.  Your skin for abnormal spots. Counseling Your health care provider may ask you questions about:  Alcohol, tobacco, and drug use.  Emotional well-being.  Home and relationship well-being.  Sexual activity.  Eating habits.  Work and work Statistician. What immunizations do I need?  Influenza (flu) vaccine  This is recommended every year. Tetanus, diphtheria, and pertussis (Tdap) vaccine  You may need a Td booster every 10 years. Varicella (chickenpox) vaccine  You may need this vaccine if you have not already been vaccinated. Zoster (shingles) vaccine  You may need this after age 64. Measles, mumps, and rubella (MMR) vaccine  You may need at least one dose of MMR if you were born in 1957 or later. You may also need a second dose. Pneumococcal conjugate (PCV13) vaccine  You may need this if you have certain conditions and were not previously vaccinated. Pneumococcal polysaccharide (PPSV23) vaccine  You may need one or two doses if you smoke cigarettes or if you have certain conditions. Meningococcal conjugate (MenACWY) vaccine  You may need this if you have certain conditions. Hepatitis A  vaccine  You may need this if you have certain conditions or if you travel or work in places where you may be exposed to hepatitis A. Hepatitis B vaccine  You may need this if you have certain conditions or if you travel or work in places where you may be exposed to hepatitis B. Haemophilus influenzae type b (Hib) vaccine  You may need this if you have certain risk factors. Human papillomavirus (HPV) vaccine  If recommended by your health care provider, you may need three doses over 6 months. You may receive vaccines as individual doses or as more than one vaccine together in one shot (combination vaccines). Talk with your health care provider about the risks and benefits of combination vaccines. What tests do I need? Blood tests  Lipid and cholesterol levels. These may be checked every 5 years, or more frequently if you are over 60 years old.  Hepatitis C test.  Hepatitis B test. Screening  Lung cancer screening. You may have this screening every year starting at age 43 if you have a 30-pack-year history of smoking and currently smoke or have quit within the past 15 years.  Prostate cancer screening. Recommendations will vary depending on your family history and other risks.  Colorectal cancer screening. All adults should have this screening starting at age 72 and continuing until age 2. Your health care provider may recommend screening at age 14 if you are at increased risk. You will have tests every 1-10 years, depending on your results and the type of screening test.  Diabetes screening. This is done by checking your blood sugar (glucose) after you have not eaten  for a while (fasting). You may have this done every 1-3 years.  Sexually transmitted disease (STD) testing. Follow these instructions at home: Eating and drinking  Eat a diet that includes fresh fruits and vegetables, whole grains, lean protein, and low-fat dairy products.  Take vitamin and mineral supplements as  recommended by your health care provider.  Do not drink alcohol if your health care provider tells you not to drink.  If you drink alcohol: ? Limit how much you have to 0-2 drinks a day. ? Be aware of how much alcohol is in your drink. In the [place] one drink equals one 12 oz bottle of beer (355 mL), one 5 oz glass of wine (148 mL), or one 1 oz glass of hard liquor (44 mL). Lifestyle  Take daily care of your teeth and gums.  Stay active. Exercise for at least 30 minutes on 5 or more days each week.  Do not use any products that contain nicotine or tobacco, such as cigarettes, e-cigarettes, and chewing tobacco. If you need help quitting, ask your health care provider.  If you are sexually active, practice safe sex. Use a condom or other form of protection to prevent STIs (sexually transmitted infections).  Talk with your health care provider about taking a low-dose aspirin every day starting at age 50. What's next?  Go to your health care provider once a year for a well check visit.  Ask your health care provider how often you should have your eyes and teeth checked.  Stay up to date on all vaccines. This information is not intended to replace advice given to you by your health care provider. Make sure you discuss any questions you have with your health care provider. Document Revised: 08/16/2018 Document Reviewed: 08/16/2018 Elsevier Patient Education  2020 [employer] Inc.  

## 2020-06-13 ENCOUNTER — Encounter: Payer: Self-pay | Admitting: Family Medicine

## 2020-06-13 DIAGNOSIS — I10 Essential (primary) hypertension: Secondary | ICD-10-CM | POA: Insufficient documentation

## 2020-06-13 DIAGNOSIS — E1159 Type 2 diabetes mellitus with other circulatory complications: Secondary | ICD-10-CM | POA: Insufficient documentation

## 2020-06-13 DIAGNOSIS — I152 Hypertension secondary to endocrine disorders: Secondary | ICD-10-CM | POA: Insufficient documentation

## 2020-07-06 ENCOUNTER — Other Ambulatory Visit: Payer: Self-pay | Admitting: Family Medicine

## 2020-07-06 DIAGNOSIS — Z794 Long term (current) use of insulin: Secondary | ICD-10-CM

## 2020-07-06 DIAGNOSIS — E1165 Type 2 diabetes mellitus with hyperglycemia: Secondary | ICD-10-CM

## 2020-07-21 ENCOUNTER — Encounter: Payer: Self-pay | Admitting: Family Medicine

## 2020-08-02 ENCOUNTER — Other Ambulatory Visit: Payer: Self-pay | Admitting: Family Medicine

## 2020-08-02 DIAGNOSIS — Z794 Long term (current) use of insulin: Secondary | ICD-10-CM

## 2020-08-13 ENCOUNTER — Ambulatory Visit: Payer: PRIVATE HEALTH INSURANCE | Admitting: Family Medicine

## 2020-08-17 ENCOUNTER — Encounter: Payer: Self-pay | Admitting: *Deleted

## 2020-08-25 ENCOUNTER — Ambulatory Visit (INDEPENDENT_AMBULATORY_CARE_PROVIDER_SITE_OTHER): Payer: PRIVATE HEALTH INSURANCE | Admitting: Family Medicine

## 2020-08-25 ENCOUNTER — Ambulatory Visit (INDEPENDENT_AMBULATORY_CARE_PROVIDER_SITE_OTHER): Payer: PRIVATE HEALTH INSURANCE

## 2020-08-25 ENCOUNTER — Other Ambulatory Visit: Payer: Self-pay

## 2020-08-25 ENCOUNTER — Encounter: Payer: Self-pay | Admitting: Family Medicine

## 2020-08-25 VITALS — BP 133/87 | HR 93 | Temp 97.7°F | Ht 70.0 in | Wt 327.2 lb

## 2020-08-25 DIAGNOSIS — M545 Low back pain, unspecified: Secondary | ICD-10-CM | POA: Diagnosis not present

## 2020-08-25 DIAGNOSIS — R1031 Right lower quadrant pain: Secondary | ICD-10-CM | POA: Diagnosis not present

## 2020-08-25 LAB — MICROSCOPIC EXAMINATION
Bacteria, UA: NONE SEEN
WBC, UA: NONE SEEN /hpf (ref 0–5)

## 2020-08-25 LAB — URINALYSIS, COMPLETE
Bilirubin, UA: NEGATIVE
Leukocytes,UA: NEGATIVE
Nitrite, UA: NEGATIVE
Protein,UA: NEGATIVE
Specific Gravity, UA: 1.015 (ref 1.005–1.030)
Urobilinogen, Ur: 0.2 mg/dL (ref 0.2–1.0)
pH, UA: 6 (ref 5.0–7.5)

## 2020-08-25 IMAGING — DX DG ABDOMEN 2V
3 series · 3 of 3 positions shown · non-contrast
Comparison: None.

CLINICAL DATA: Right lower quadrant pain.

EXAM:
ABDOMEN - 2 VIEW

[abdomen erect]
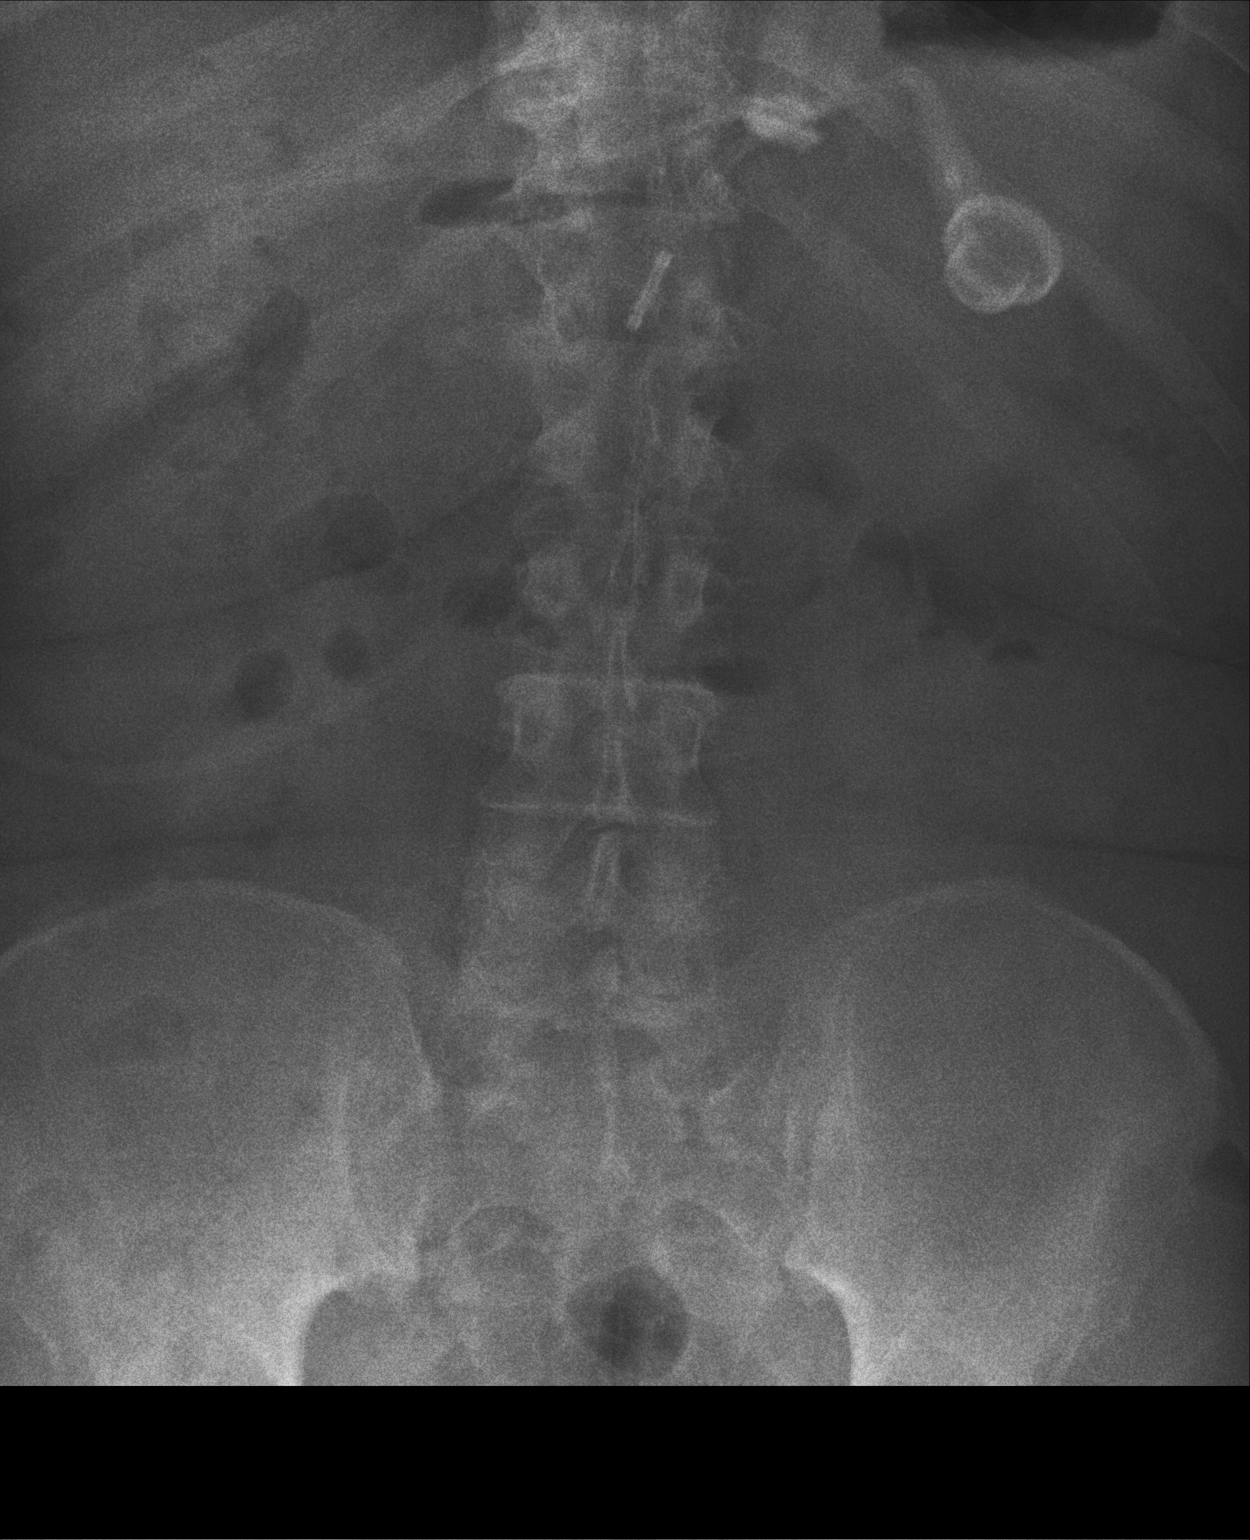

[abdomen supine (1 of 2)]
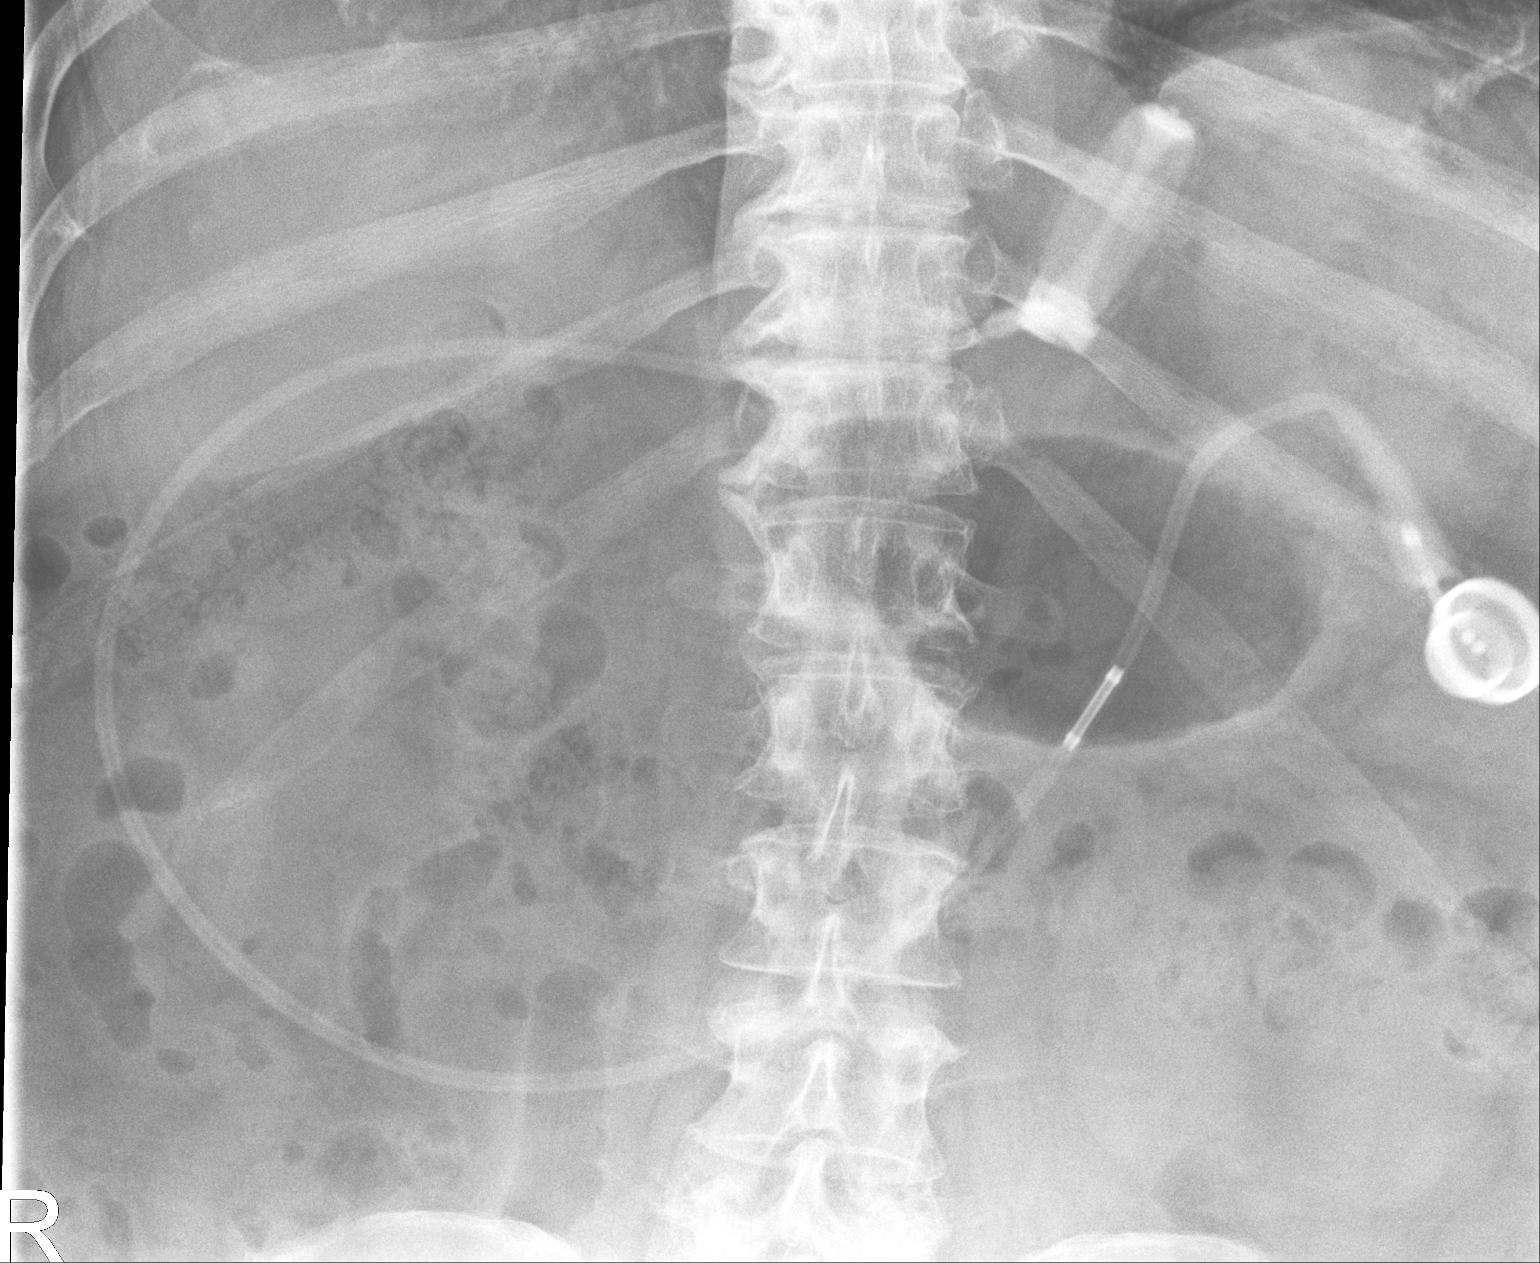

[abdomen supine (2 of 2)]
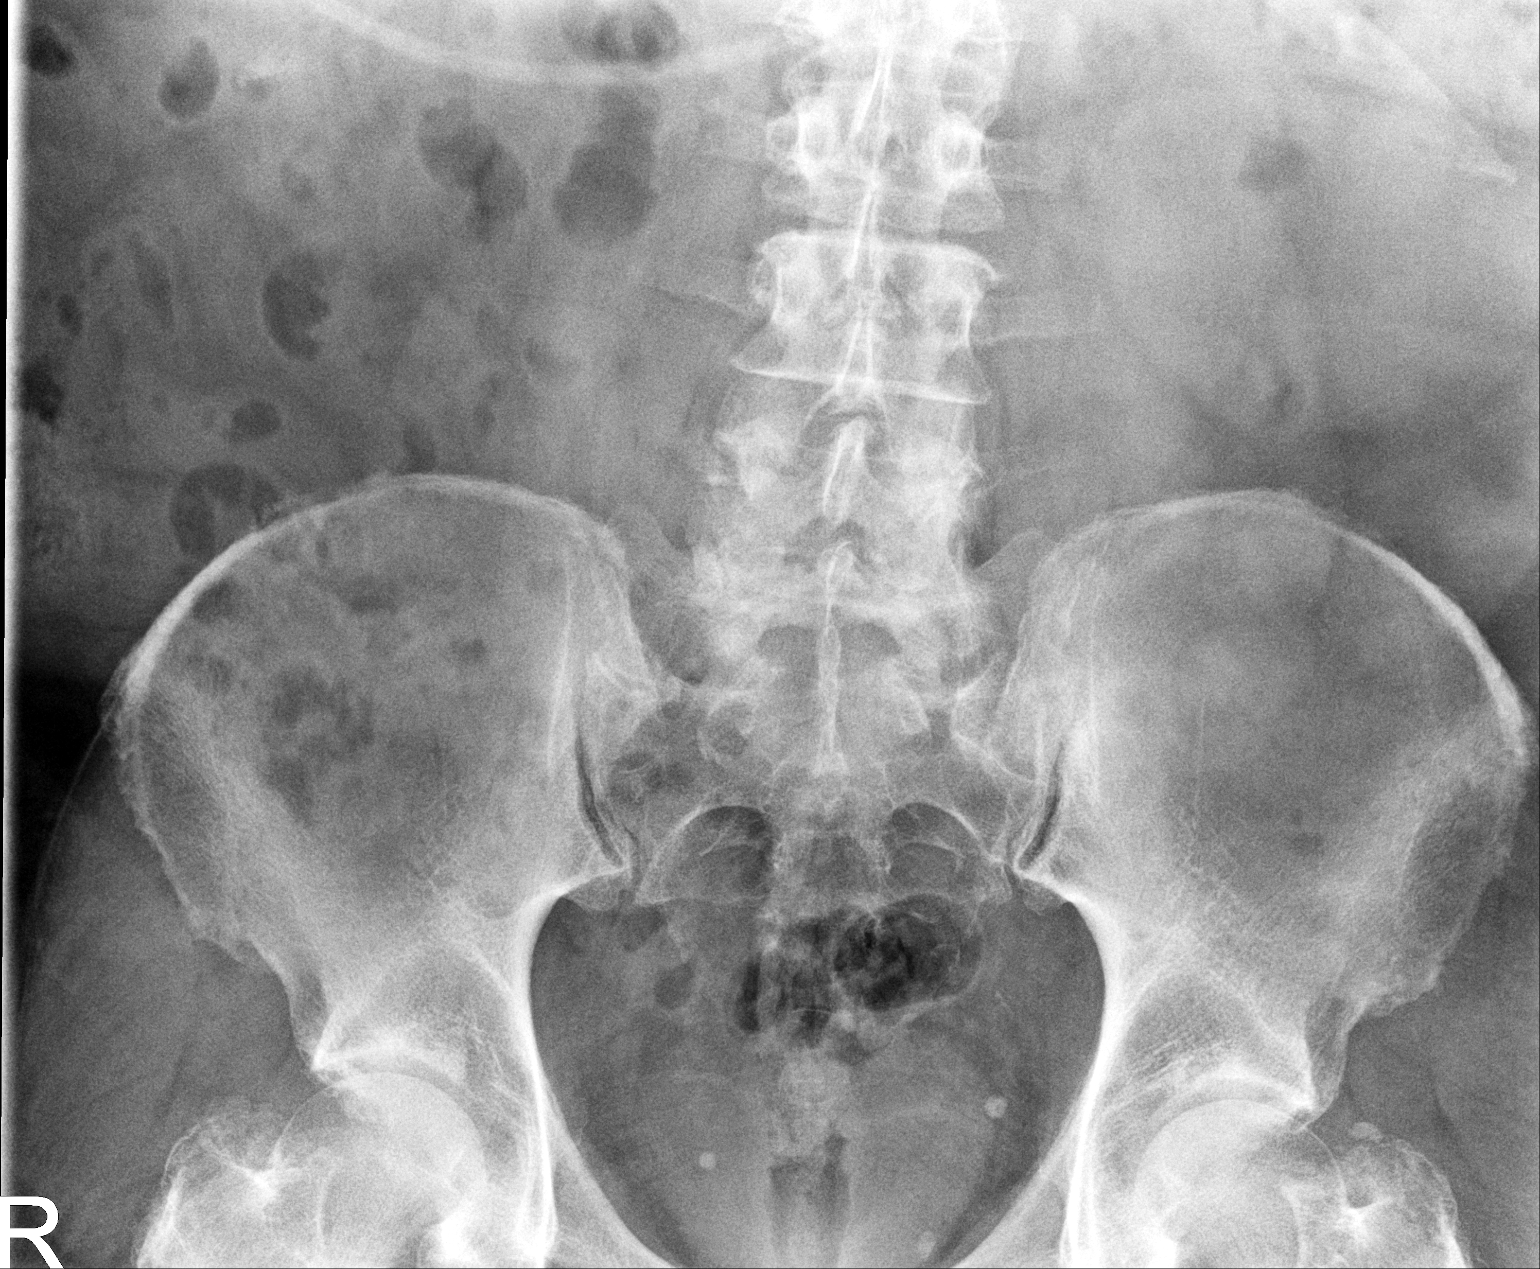

[3 of 3 positions shown; findings below may reference images not displayed]

FINDINGS: Sequela of gastric lap band. Nonobstructive bowel gas pattern. Mild
stool burden. Pelvic phleboliths. No acute osseous abnormality.
IMPRESSION: Nonobstructive bowel gas pattern.  Mild stool burden.

## 2020-08-25 NOTE — Patient Instructions (Signed)
Abdominal Pain, Adult Pain in the abdomen (abdominal pain) can be caused by many things. Often, abdominal pain is not serious and it gets better with no treatment or by being treated at home. However, sometimes abdominal pain is serious. Your health care provider will ask questions about your medical history and do a physical exam to try to determine the cause of your abdominal pain. Follow these instructions at home:  Medicines  Take over-the-counter and prescription medicines only as told by your health care provider.  Do not take a laxative unless told by your health care provider. General instructions  Watch your condition for any changes.  Drink enough fluid to keep your urine pale yellow.  Keep all follow-up visits as told by your health care provider. This is important. Contact a health care provider if:  Your abdominal pain changes or gets worse.  You are not hungry or you lose weight without trying.  You are constipated or have diarrhea for more than 2-3 days.  You have pain when you urinate or have a bowel movement.  Your abdominal pain wakes you up at night.  Your pain gets worse with meals, after eating, or with certain foods.  You are vomiting and cannot keep anything down.  You have a fever.  You have blood in your urine. Get help right away if:  Your pain does not go away as soon as your health care provider told you to expect.  You cannot stop vomiting.  Your pain is only in areas of the abdomen, such as the right side or the left lower portion of the abdomen. Pain on the right side could be caused by appendicitis.  You have bloody or black stools, or stools that look like tar.  You have severe pain, cramping, or bloating in your abdomen.  You have signs of dehydration, such as: ? Dark urine, very little urine, or no urine. ? Cracked lips. ? Dry mouth. ? Sunken eyes. ? Sleepiness. ? Weakness.  You have trouble breathing or chest  pain. Summary  Often, abdominal pain is not serious and it gets better with no treatment or by being treated at home. However, sometimes abdominal pain is serious.  Watch your condition for any changes.  Take over-the-counter and prescription medicines only as told by your health care provider.  Contact a health care provider if your abdominal pain changes or gets worse.  Get help right away if you have severe pain, cramping, or bloating in your abdomen. This information is not intended to replace advice given to you by your health care provider. Make sure you discuss any questions you have with your health care provider. Document Revised: 12/31/2018 Document Reviewed: 12/31/2018 Elsevier Patient Education  2020 Elsevier Inc.  

## 2020-08-25 NOTE — Progress Notes (Signed)
Subjective: CC: abdominal pain PCP: Gwenlyn Fudge, FNP  IPJ:ASNKN Daniel Scott is a 59 y.o. male presenting to clinic today for:  1. Abdominal pain Daniel Scott reports lower right abdominal pain in his right groin. He has felt this pain off and on for years. The pain has gotten more painful and more frequent over the last 6 months. About 4 days ago the pain really bad, it was a constant sharp pain especially with walking and standing for the whole day. Since then pain has eased up and it is now a dull ache. He also reports some lower right sided back pain for 4 days. The back pain is a constant pain with intermittent worsening. It feels kind of like a cramp at times in his back. He denies dysuria, blood in urine, frequency, or urgency. He does have a history of blood in his urine for which he has seen multiple urologists for without known cause. He denies nausea, vomiting, or constipation. He did have a few episodes of diarrhea about a week ago, but not since. Denies fever or chills. He denies a history of a hernia and has not felt a bulge, but is concerned that this may be the cause of his pain. Denies a history of kidney stones.   Relevant past medical, surgical, family, and social history reviewed and updated as indicated.  Allergies and medications reviewed and updated.  Allergies  Allergen Reactions  . Sulfa Antibiotics    Past Medical History:  Diagnosis Date  . Arthritis    hands and feet  . Depression   . Diabetes mellitus without complication (HCC)    type 2  . Fatty liver   . GERD (gastroesophageal reflux disease)   . Hiatal hernia   . Hyperlipidemia   . Hypertension   . Hypothyroidism   . Neuromuscular disorder (HCC)    stenosis  . Pneumonia 1990  . Stenosis of cervical spine   . Thyroid disease     Current Outpatient Medications:  .  amLODipine (NORVASC) 10 MG tablet, Take 1 tablet (10 mg total) by mouth daily., Disp: 90 tablet, Rfl: 1 .  atorvastatin (LIPITOR) 80 MG  tablet, Take 1 tablet (80 mg total) by mouth daily., Disp: 90 tablet, Rfl: 1 .  Cholecalciferol (DIALYVITE VITAMIN D 5000) 125 MCG (5000 UT) capsule, Take 5,000 Units by mouth daily., Disp: , Rfl:  .  Cyanocobalamin (B-12) 5000 MCG CAPS, Take 5,000 mcg by mouth daily., Disp: , Rfl:  .  ertugliflozin Daniel-PyroglutamicAc (STEGLATRO) 15 MG TABS tablet, Take 1 tablet (15 mg total) by mouth daily., Disp: 90 tablet, Rfl: 1 .  Garlic 1000 MG CAPS, Take 1,000 mg by mouth 2 (two) times daily., Disp: , Rfl:  .  HYDROcodone-acetaminophen (NORCO/VICODIN) 5-325 MG tablet, Take 1-2 tablets by mouth every 4 (four) hours as needed for moderate pain ((score 4 to 6))., Disp: 30 tablet, Rfl: 0 .  Krill Oil 500 MG CAPS, Take 500 mg by mouth daily., Disp: , Rfl:  .  levothyroxine (SYNTHROID) 75 MCG tablet, Take 1 tablet (75 mcg total) by mouth daily before breakfast., Disp: 90 tablet, Rfl: 1 .  lisinopril (ZESTRIL) 20 MG tablet, Take 1 tablet (20 mg total) by mouth daily., Disp: 90 tablet, Rfl: 1 .  metFORMIN (GLUCOPHAGE) 500 MG tablet, Take 1 tablet (500 mg total) by mouth 2 (two) times daily with a meal., Disp: 180 tablet, Rfl: 1 .  methocarbamol (ROBAXIN) 500 MG tablet, Take 1 tablet (500 mg total)  by mouth every 8 (eight) hours as needed for muscle spasms., Disp: 60 tablet, Rfl: 1 .  Multiple Vitamin (MULTIVITAMIN ADULT PO), Take 1 tablet by mouth daily. , Disp: , Rfl:  .  omeprazole (PRILOSEC) 20 MG capsule, Take 1 capsule (20 mg total) by mouth every evening., Disp: 90 capsule, Rfl: 1 .  spironolactone (ALDACTONE) 25 MG tablet, Take 1 tablet (25 mg total) by mouth daily., Disp: 90 tablet, Rfl: 1 .  TRULICITY 3 MG/0.5ML SOPN, INJECT 0.5 MLS (3 MG TOTAL) AS DIRECTED ONCE A WEEK, Disp: 4 mL, Rfl: 0 Social History   Socioeconomic History  . Marital status: Married    Spouse name: Not on file  . Number of children: Not on file  . Years of education: Not on file  . Highest education level: Not on file  Occupational  History  . Not on file  Tobacco Use  . Smoking status: Former Smoker    Types: Cigarettes    Quit date: 05/2009    Years since quitting: 11.3  . Smokeless tobacco: Never Used  Vaping Use  . Vaping Use: Never used  Substance and Sexual Activity  . Alcohol use: Yes    Comment: daily  . Drug use: Yes    Types: Marijuana    Comment: occ  . Sexual activity: Yes    Birth control/protection: None  Other Topics Concern  . Not on file  Social History Narrative  . Not on file   Social Determinants of Health   Financial Resource Strain: Not on file  Food Insecurity: Not on file  Transportation Needs: Not on file  Physical Activity: Not on file  Stress: Not on file  Social Connections: Not on file  Intimate Partner Violence: Not on file   Family History  Problem Relation Age of Onset  . Vascular Disease Mother   . Cancer Mother        Bone marrow  . Kidney disease Mother   . Thyroid disease Mother   . Hypertension Mother   . Diabetes Mother   . Coronary artery disease Father   . Colon cancer Father   . Heart disease Father   . Hyperlipidemia Father   . Hypertension Father   . Spina bifida Daughter   . Depression Son   . Anxiety disorder Son   . Hypertension Son   . Breast cancer Maternal Grandmother   . Hypertension Maternal Grandmother   . Stroke Maternal Grandmother   . Lung cancer Maternal Grandfather   . Diabetes Maternal Grandfather   . Heart disease Maternal Grandfather   . Hypertension Maternal Grandfather   . Stroke Maternal Grandfather   . Hypertension Paternal Grandmother   . Heart disease Paternal Grandfather   . Hypertension Paternal Grandfather   . Autism Son   . Bipolar disorder Son   . Autism Son     Review of Systems  Negative unless specially indicated above in HPI.  Objective: Office vital signs reviewed. BP 133/87   Pulse 93   Temp 97.7 F (36.5 C) (Temporal)   Ht 5\' 10"  (1.778 m)   Wt (!) 327 lb 4 oz (148.4 kg)   BMI 46.96 kg/m    Physical Examination:  Physical Exam Vitals and nursing note reviewed.  Constitutional:      General: He is not in acute distress.    Appearance: He is obese. He is not ill-appearing, toxic-appearing or diaphoretic.  Cardiovascular:     Rate and Rhythm: Normal rate and regular  rhythm.     Heart sounds: Normal heart sounds. No murmur heard.   Pulmonary:     Effort: Pulmonary effort is normal. No respiratory distress.     Breath sounds: Normal breath sounds.  Abdominal:     General: Abdomen is protuberant. Bowel sounds are normal. There is no distension. There are no signs of injury.     Palpations: Abdomen is soft. There is no shifting dullness, mass or pulsatile mass.     Tenderness: There is abdominal tenderness in the right lower quadrant. There is no right CVA tenderness, left CVA tenderness, guarding or rebound. Negative signs include Murphy's sign, Rovsing's sign and McBurney's sign.     Hernia: No hernia is present.  Skin:    General: Skin is warm and dry.     Capillary Refill: Capillary refill takes less than 2 seconds.  Neurological:     General: No focal deficit present.     Mental Status: He is alert and oriented to person, place, and time.     Motor: No weakness.  Psychiatric:        Mood and Affect: Mood normal.        Behavior: Behavior normal.     Urine dipstick shows positive for 1+ RBC's, positive for 3+ glucose and positive for trace ketones.  Micro exam: 0-2 RBC's per HPF.  Results for orders placed or performed during the hospital encounter of 06/02/20  Glucose, capillary  Result Value Ref Range   Glucose-Capillary 135 (H) 70 - 99 mg/dL  Protime-INR  Result Value Ref Range   Prothrombin Time 12.0 11.4 - 15.2 seconds   INR 0.9 0.8 - 1.2  Glucose, capillary  Result Value Ref Range   Glucose-Capillary 172 (H) 70 - 99 mg/dL   Comment 1 Notify RN   CBC  Result Value Ref Range   WBC 11.6 (H) 4.0 - 10.5 K/uL   RBC 4.68 4.22 - 5.81 MIL/uL   Hemoglobin  14.5 13.0 - 17.0 g/dL   HCT 75.9 16.3 - 84.6 %   MCV 94.0 80.0 - 100.0 fL   MCH 31.0 26.0 - 34.0 pg   MCHC 33.0 30.0 - 36.0 g/dL   RDW 65.9 93.5 - 70.1 %   Platelets 238 150 - 400 K/uL   nRBC 0.0 0.0 - 0.2 %  Creatinine, serum  Result Value Ref Range   Creatinine, Ser 0.94 0.61 - 1.24 mg/dL   GFR calc non Af Amer >60 >60 mL/min   GFR calc Af Amer >60 >60 mL/min  Glucose, capillary  Result Value Ref Range   Glucose-Capillary 200 (H) 70 - 99 mg/dL   Comment 1 Notify RN    Comment 2 Document in Chart   Basic metabolic panel  Result Value Ref Range   Sodium 135 135 - 145 mmol/Daniel   Potassium 4.0 3.5 - 5.1 mmol/Daniel   Chloride 100 98 - 111 mmol/Daniel   CO2 22 22 - 32 mmol/Daniel   Glucose, Bld 170 (H) 70 - 99 mg/dL   BUN 17 6 - 20 mg/dL   Creatinine, Ser 7.79 0.61 - 1.24 mg/dL   Calcium 9.0 8.9 - 39.0 mg/dL   GFR calc non Af Amer >60 >60 mL/min   GFR calc Af Amer >60 >60 mL/min   Anion gap 13 5 - 15  Glucose, capillary  Result Value Ref Range   Glucose-Capillary 180 (H) 70 - 99 mg/dL   Comment 1 Notify RN    Comment 2 Document in Chart  Glucose, capillary  Result Value Ref Range   Glucose-Capillary 154 (H) 70 - 99 mg/dL   Comment 1 Notify RN    Comment 2 Document in Chart   ABO/Rh  Result Value Ref Range   ABO/RH(D)      A POS Performed at [facility], [address] [place], [place] [address]      Assessment/ Plan: Daniel PicketScott was seen today for abdominal pain.  Diagnoses and all orders for this visit:  Acute right-sided low back pain without sciatica UA doesn't indicate cystitis.  -     Urinalysis, Complete -     DG Abd 2 Views  Right lower quadrant abdominal pain RLQ tenderness on exam. No hernia felt. UA doesn't indicate cystitis. Radiology report pending. Symptoms are improving. Strict return precautions given for now. Will notify patient of radiology results. Return to office for new or worsening symptoms, or if symptoms persist.  -     Urinalysis, Complete -      DG Abd 2 Views  Follow up as needed.   The above assessment and management plan was discussed with the patient. The patient verbalized understanding of and has agreed to the management plan. Patient is aware to call the clinic if symptoms persist or worsen. Patient is aware when to return to the clinic for a follow-up visit. Patient educated on when it is appropriate to go to the emergency department.   Harlow Maresiffany Harshith Pursell, FNP-C [facility] [address] [place], [place] [address] [phone]

## 2020-08-30 ENCOUNTER — Other Ambulatory Visit: Payer: Self-pay | Admitting: Family

## 2020-08-30 DIAGNOSIS — E1165 Type 2 diabetes mellitus with hyperglycemia: Secondary | ICD-10-CM

## 2020-08-30 DIAGNOSIS — Z794 Long term (current) use of insulin: Secondary | ICD-10-CM

## 2020-09-02 ENCOUNTER — Encounter (HOSPITAL_COMMUNITY): Payer: Self-pay

## 2020-09-02 ENCOUNTER — Emergency Department (HOSPITAL_COMMUNITY)
Admission: EM | Admit: 2020-09-02 | Discharge: 2020-09-02 | Disposition: A | Discharge status: Home / Self Care | Payer: PRIVATE HEALTH INSURANCE | Attending: Emergency Medicine | Admitting: Emergency Medicine

## 2020-09-02 ENCOUNTER — Other Ambulatory Visit: Payer: Self-pay

## 2020-09-02 DIAGNOSIS — Z7984 Long term (current) use of oral hypoglycemic drugs: Secondary | ICD-10-CM | POA: Diagnosis not present

## 2020-09-02 DIAGNOSIS — M62830 Muscle spasm of back: Secondary | ICD-10-CM | POA: Diagnosis not present

## 2020-09-02 DIAGNOSIS — E119 Type 2 diabetes mellitus without complications: Secondary | ICD-10-CM | POA: Insufficient documentation

## 2020-09-02 DIAGNOSIS — E039 Hypothyroidism, unspecified: Secondary | ICD-10-CM | POA: Insufficient documentation

## 2020-09-02 DIAGNOSIS — Z87891 Personal history of nicotine dependence: Secondary | ICD-10-CM | POA: Insufficient documentation

## 2020-09-02 DIAGNOSIS — M549 Dorsalgia, unspecified: Secondary | ICD-10-CM | POA: Diagnosis present

## 2020-09-02 DIAGNOSIS — Z79899 Other long term (current) drug therapy: Secondary | ICD-10-CM | POA: Diagnosis not present

## 2020-09-02 DIAGNOSIS — Z794 Long term (current) use of insulin: Secondary | ICD-10-CM | POA: Insufficient documentation

## 2020-09-02 DIAGNOSIS — I10 Essential (primary) hypertension: Secondary | ICD-10-CM | POA: Insufficient documentation

## 2020-09-02 MED ORDER — DEXAMETHASONE 4 MG PO TABS
6.0000 mg | ORAL_TABLET | Freq: Once | ORAL | Status: AC
  Administered 2020-09-02: 6 mg via ORAL
  Filled 2020-09-02: qty 2

## 2020-09-02 MED ORDER — KETOROLAC TROMETHAMINE 30 MG/ML IJ SOLN
30.0000 mg | Freq: Once | INTRAMUSCULAR | Status: AC
  Administered 2020-09-02: 30 mg via INTRAMUSCULAR
  Filled 2020-09-02: qty 1

## 2020-09-02 MED ORDER — TIZANIDINE HCL 6 MG PO CAPS: 6.0000 mg | ORAL_CAPSULE | Freq: Three times a day (TID) | ORAL | 0 refills | Status: AC

## 2020-09-02 MED ORDER — DIAZEPAM 5 MG PO TABS
5.0000 mg | ORAL_TABLET | Freq: Once | ORAL | Status: AC
  Administered 2020-09-02: 5 mg via ORAL
  Filled 2020-09-02: qty 1

## 2020-09-02 NOTE — ED Provider Notes (Signed)
[facility] EMERGENCY DEPARTMENT Provider Note   CSN: [id number] Arrival date & time: 09/02/20  7616     History Chief Complaint  Patient presents with   Back Pain    Daniel Scott is a 59 y.o. male with hospital history significant for arthritis, type 2 diabetes, hyperlipidemia, hypertension, stenosis of cervical spine.  HPI Patient presents to emergency room today with chief complaint of back pain x4 days.  He states it started after moving couches and a rug.  Patient states the pain initially was near his left scapula and felt like a spasm and cramping sensation.  He noticed a knot where the pain was.  He has tried using over-the-counter lidocaine patches, taking Tylenol, taking Robaxin and using topical cream for the pain with only very minimal improvement.  He states the pain is now radiating to his right scapula.  He feels a knot on that side.  He has tried applying a heating pad which he thinks made the knot smaller.  States his pain waxes and wanes.  With movement pain is 10 out of 10 in severity. This morning the pain radiated through to his chest. Chest pain is not exertional and he denies any associated shortness of breath or diaphoresis.  Patient had ACDF C4-6 on 06/02/2020 with Dr. Jake Samples. He has been doing well overall since the procedure. Today he was talking to Dr. Jake Samples on the phone about his upcoming post op appointment and let him know about the pain he was currently having. Patient states Dr. Jake Samples advised he be seen in the ED for further evaluation and thought symptoms sounded suggestive of muscle spasm vs soft tissue injury. Patient states his he does not have any pain at his surgical site. He denies fever chills, numbness, tingling, weakness, bowel/bladder incontinence, urinary retention, saddle anesthesia, history of IVDA.    Past Medical History:  Diagnosis Date   Arthritis    hands and feet   Depression    Diabetes mellitus without complication (HCC)     type 2   Fatty liver    GERD (gastroesophageal reflux disease)    Hiatal hernia    Hyperlipidemia    Hypertension    Hypothyroidism    Neuromuscular disorder (HCC)    stenosis   Pneumonia 1990   Stenosis of cervical spine    Thyroid disease     Patient Active Problem List   Diagnosis Date Noted   Essential hypertension 06/13/2020   Type 2 diabetes mellitus with hyperglycemia, with long-term current use of insulin (HCC) 03/10/2020   Morbid obesity (HCC) 03/10/2020   Hyperlipidemia    Thyroid disease    GERD (gastroesophageal reflux disease)     Past Surgical History:  Procedure Laterality Date   ANTERIOR CERVICAL DECOMP/DISCECTOMY FUSION N/A 06/02/2020   Procedure: ANTERIOR CERVICAL DECOMPRESSION/DISCECTOMY FUSIONCERVICAL FOUR- CERVICAL FIVE, CERVICAL FIVE- CERVICAL SIX;  Surgeon: Bethann Goo, DO;  Location: [facility] OR;  Service: Neurosurgery;  Laterality: N/A;  ANTERIOR CERVICAL DECOMPRESSION/DISCECTOMY FUSIONCERVICAL FOUR- CERVICAL FIVE, CERVICAL FIVE- CERVICAL SIX   CARDIAC CATHETERIZATION  1996; 2006   [facility]   HYDROCELE EXCISION     LAPAROSCOPIC GASTRIC BANDING     LAPAROSCOPIC ROUX-EN-Y GASTRIC BYPASS WITH UPPER ENDOSCOPY AND REMOVAL OF LAP BAND  02/2012   TONSILLECTOMY         Family History  Problem Relation Age of Onset   Vascular Disease Mother    Cancer Mother        Bone marrow  Kidney disease Mother    Thyroid disease Mother    Hypertension Mother    Diabetes Mother    Coronary artery disease Father    Colon cancer Father    Heart disease Father    Hyperlipidemia Father    Hypertension Father    Spina bifida Daughter    Depression Son    Anxiety disorder Son    Hypertension Son    Breast cancer Maternal Grandmother    Hypertension Maternal Grandmother    Stroke Maternal Grandmother    Lung cancer Maternal Grandfather    Diabetes Maternal Grandfather    Heart disease Maternal  Grandfather    Hypertension Maternal Grandfather    Stroke Maternal Grandfather    Hypertension Paternal Grandmother    Heart disease Paternal Grandfather    Hypertension Paternal Grandfather    Autism Son    Bipolar disorder Son    Autism Son     Social History   Tobacco Use   Smoking status: Former Smoker    Types: Cigarettes    Quit date: 05/2009    Years since quitting: 11.3   Smokeless tobacco: Never Used  Vaping Use   Vaping Use: Never used  Substance Use Topics   Alcohol use: Yes    Comment: daily   Drug use: Yes    Types: Marijuana    Comment: occ    Home Medications Prior to Admission medications   Medication Sig Start Date End Date Taking? Authorizing Provider  tizanidine (ZANAFLEX) 6 MG capsule Take 1 capsule (6 mg total) by mouth 3 (three) times daily for 5 days. 09/02/20 09/07/20 Yes Walisiewicz, Emonni Depasquale E, PA-C  amLODipine (NORVASC) 10 MG tablet Take 1 tablet (10 mg total) by mouth daily. 06/11/20   Gwenlyn Fudge, FNP  atorvastatin (LIPITOR) 80 MG tablet Take 1 tablet (80 mg total) by mouth daily. 06/11/20   Gwenlyn Fudge, FNP  Cholecalciferol (DIALYVITE VITAMIN D 5000) 125 MCG (5000 UT) capsule Take 5,000 Units by mouth daily.    [provider]  Cyanocobalamin (B-12) 5000 MCG CAPS Take 5,000 mcg by mouth daily.    [provider]  ertugliflozin L-PyroglutamicAc (STEGLATRO) 15 MG TABS tablet Take 1 tablet (15 mg total) by mouth daily. 06/11/20   Gwenlyn Fudge, FNP  Garlic 1000 MG CAPS Take 1,000 mg by mouth 2 (two) times daily.    [provider]  HYDROcodone-acetaminophen (NORCO/VICODIN) 5-325 MG tablet Take 1-2 tablets by mouth every 4 (four) hours as needed for moderate pain ((score 4 to 6)). 06/03/20   Dawley, Troy C, DO  Krill Oil 500 MG CAPS Take 500 mg by mouth daily.    [provider]  levothyroxine (SYNTHROID) 75 MCG tablet Take 1 tablet (75 mcg total) by mouth daily before breakfast. 06/11/20    Gwenlyn Fudge, FNP  lisinopril (ZESTRIL) 20 MG tablet Take 1 tablet (20 mg total) by mouth daily. 06/11/20   Gwenlyn Fudge, FNP  metFORMIN (GLUCOPHAGE) 500 MG tablet Take 1 tablet (500 mg total) by mouth 2 (two) times daily with a meal. 06/11/20   Gwenlyn Fudge, FNP  methocarbamol (ROBAXIN) 500 MG tablet Take 1 tablet (500 mg total) by mouth every 8 (eight) hours as needed for muscle spasms. 06/11/20   Gwenlyn Fudge, FNP  Multiple Vitamin (MULTIVITAMIN ADULT PO) Take 1 tablet by mouth daily.     [provider]  omeprazole (PRILOSEC) 20 MG capsule Take 1 capsule (20 mg total) by mouth every  evening. 06/11/20   Gwenlyn FudgeJoyce, Britney F, FNP  spironolactone (ALDACTONE) 25 MG tablet Take 1 tablet (25 mg total) by mouth daily. 06/11/20   Gwenlyn FudgeJoyce, Britney F, FNP  TRULICITY 3 MG/0.5ML SOPN INJECT 0.5 MLS (3 MG TOTAL) AS DIRECTED ONCE A WEEK 08/31/20   Jannifer RodneyHawks, Christy A, FNP    Allergies    Sulfa antibiotics  Review of Systems   Review of Systems All other systems are reviewed and are negative for acute change except as noted in the HPI.  Physical Exam Updated Vital Signs BP (!) 146/86 (BP Location: Right Arm)    Pulse 99    Temp 98.2 F (36.8 C) (Oral)    Resp 16    Ht 5\' 10"  (1.778 m)    Wt (!) 147.4 kg    SpO2 98%    BMI 46.63 kg/m   Physical Exam Vitals and nursing note reviewed.  Constitutional:      General: He is not in acute distress.    Appearance: He is not ill-appearing.  HENT:     Head: Normocephalic and atraumatic.     Right Ear: Tympanic membrane and external ear normal.     Left Ear: Tympanic membrane and external ear normal.     Nose: Nose normal.     Mouth/Throat:     Mouth: Mucous membranes are moist.     Pharynx: Oropharynx is clear.  Eyes:     General: No scleral icterus.       Right eye: No discharge.        Left eye: No discharge.     Extraocular Movements: Extraocular movements intact.     Conjunctiva/sclera: Conjunctivae normal.     Pupils: Pupils are  equal, round, and reactive to light.  Neck:     Vascular: No JVD.  Cardiovascular:     Rate and Rhythm: Normal rate and regular rhythm.     Pulses: Normal pulses.          Radial pulses are 2+ on the right side and 2+ on the left side.     Heart sounds: Normal heart sounds.  Pulmonary:     Comments: Lungs clear to auscultation in all fields. Symmetric chest rise. No wheezing, rales, or rhonchi. Chest:     Chest wall: No tenderness.  Abdominal:     Comments: Abdomen is soft, non-distended, and non-tender in all quadrants. No rigidity, no guarding. No peritoneal signs.  Musculoskeletal:        General: Normal range of motion.     Cervical back: Normal range of motion. No rigidity.       Back:     Comments: Tender to palpation as depicted in image above with spasm felt on left. No overlying skin changes. No erythema. No midline tenderness.  Full ROM of cervical and lumbar spine. No step offs  Skin:    General: Skin is warm and dry.     Capillary Refill: Capillary refill takes less than 2 seconds.  Neurological:     Mental Status: He is oriented to person, place, and time.     GCS: GCS eye subscore is 4. GCS verbal subscore is 5. GCS motor subscore is 6.     Comments:  Speech is clear and goal oriented, follows commands CN III-XII intact, no facial droop Normal strength in upper and lower extremities bilaterally including dorsiflexion and plantar flexion, strong and equal grip strength Sensation normal to light and sharp touch Moves extremities without ataxia, coordination intact Normal  finger to nose and rapid alternating movements Normal gait and balance   Psychiatric:        Behavior: Behavior normal.     ED Results / Procedures / Treatments   Labs (all labs ordered are listed, but only abnormal results are displayed) Labs Reviewed - No data to display  EKG EKG Interpretation  Date/Time:  Wednesday September 02 2020 08:09:53 EST Ventricular Rate:  99 PR  Interval:  218 QRS Duration: 74 QT Interval:  298 QTC Calculation: 382 R Axis:   30 Text Interpretation: Sinus rhythm with 1st degree A-V block Low voltage QRS Cannot rule out Anterior infarct , age undetermined Abnormal ECG No significant change since last tracing Confirmed by Susy Frizzle [phone]) on 09/02/2020 8:19:36 AM   Radiology No results found.  Procedures Procedures (including critical care time)  Medications Ordered in ED Medications  dexamethasone (DECADRON) tablet 6 mg (6 mg Oral Given 09/02/20 1250)  ketorolac (TORADOL) 30 MG/ML injection 30 mg (30 mg Intramuscular Given 09/02/20 1250)  diazepam (VALIUM) tablet 5 mg (5 mg Oral Given 09/02/20 1250)    ED Course  I have reviewed the triage vital signs and the nursing notes.  Pertinent labs & imaging results that were available during my care of the patient were reviewed by me and considered in my medical decision making (see chart for details).    MDM Rules/Calculators/A&P                          History provided by patient with additional history obtained from chart review.    59 yo male presenting with back pain. Afebrile, HDS. He is well appearing, in no acute distress. Exam consistent with muscle spasm. Neuro exam is normal, no focal weakness. No red flags for back pain. EKG in triage without ischemic changes.  Very low suspicion for ACS, PE, or dissection based on exam. Patient given IM toradol, PO decadron, valium in the ED. On reassessment pain has improved. Chart review shows no history of elevated creatinine.  Engaged in shared decision making with patient. He does not want to proceed with lab work or imaging for his pain given reassuring exam. Will discharge home with prescription for tizanidine. Discussed muscle relaxer precautions. Advised to follow up with pcp or neurosurgery if symptoms continue.   The patient appears reasonably screened and/or stabilized for discharge and I doubt any other medical  condition or other [facility] requiring further screening, evaluation, or treatment in the ED at this time prior to discharge. The patient is safe for discharge with strict return precautions discussed.    Portions of this note were generated with Scientist, clinical (histocompatibility and immunogenetics). Dictation errors may occur despite best attempts at proofreading.   Final Clinical Impression(s) / ED Diagnoses Final diagnoses:  Muscle spasm of back    Rx / DC Orders ED Discharge Orders         Ordered    tizanidine (ZANAFLEX) 6 MG capsule  3 times daily        09/02/20 1245           Kandice Hams 09/02/20 1329    Pollyann Savoy, MD 09/02/20 1421

## 2020-09-02 NOTE — Discharge Instructions (Addendum)
-  prescription sent to pharmacy for zanaflex. This is muscle relaxer. It can make you drowsy so do not drive or work while taking. Stop taking the methocarbamol while you take zanaflex.  Take tylenol for pain. Take recommended dose on the bottle.   -continue over the counter medications you were trying at home for your pain. They will likely help if you use them consistently for a short course.  Follow up with primary care doctor or Dr. Jake Samples if you continue to have pain.  Return to the emergency department for any new or worsening symptoms.

## 2020-09-02 NOTE — ED Triage Notes (Signed)
Pt presents to ED with complaints of upper back pain since Sunday after moving a piece of furniture. Pt states pain is now in chest and down left arm. Pt states he has been using motrin and muscle relaxers at home with no improvement.

## 2020-09-10 ENCOUNTER — Ambulatory Visit: Payer: PRIVATE HEALTH INSURANCE | Admitting: Family Medicine

## 2020-09-24 ENCOUNTER — Other Ambulatory Visit: Payer: Self-pay | Admitting: Neurological Surgery

## 2020-09-24 DIAGNOSIS — M5414 Radiculopathy, thoracic region: Secondary | ICD-10-CM

## 2020-09-24 DIAGNOSIS — M5412 Radiculopathy, cervical region: Secondary | ICD-10-CM

## 2020-09-25 ENCOUNTER — Encounter: Payer: Self-pay | Admitting: Family Medicine

## 2020-09-25 ENCOUNTER — Other Ambulatory Visit: Payer: Self-pay

## 2020-09-25 ENCOUNTER — Ambulatory Visit (INDEPENDENT_AMBULATORY_CARE_PROVIDER_SITE_OTHER): Payer: PRIVATE HEALTH INSURANCE | Admitting: Family Medicine

## 2020-09-25 VITALS — BP 134/88 | HR 99 | Temp 97.5°F | Ht 70.0 in | Wt 318.2 lb

## 2020-09-25 DIAGNOSIS — I10 Essential (primary) hypertension: Secondary | ICD-10-CM

## 2020-09-25 DIAGNOSIS — E782 Mixed hyperlipidemia: Secondary | ICD-10-CM | POA: Diagnosis not present

## 2020-09-25 DIAGNOSIS — E079 Disorder of thyroid, unspecified: Secondary | ICD-10-CM

## 2020-09-25 DIAGNOSIS — E1165 Type 2 diabetes mellitus with hyperglycemia: Secondary | ICD-10-CM | POA: Diagnosis not present

## 2020-09-25 DIAGNOSIS — Z794 Long term (current) use of insulin: Secondary | ICD-10-CM

## 2020-09-25 LAB — BAYER DCA HB A1C WAIVED: HB A1C (BAYER DCA - WAIVED): 6.8 % (ref <7.0)

## 2020-09-25 NOTE — Progress Notes (Signed)
Assessment & Plan:  1. Type 2 diabetes mellitus with hyperglycemia, with long-term current use of insulin (HCC) Lab Results  Component Value Date   HGBA1C 6.8 09/25/2020   HGBA1C 6.8 (H) 05/29/2020   HGBA1C 7.1 (H) 03/10/2020    - Diabetes is at goal of A1c < 7. - Medications: continue current medications - Patient is currently taking a statin. Patient is taking an ACE-inhibitor/ARB.  - Last foot exam: 03/10/2020 - Last diabetic eye exam: recent per patient - record requested - Urine Microalbumin/Creat Ratio: 03/10/2020 - Instruction/counseling given: discussed the need for weight loss - Bayer DCA Hb A1c Waived  2. Essential hypertension - Well controlled on current regimen.  - CMP14+EGFR - CBC with Differential/Platelet - PSA, total and free  3. Mixed hyperlipidemia - Well controlled on current regimen.  - Lipid panel  4. Thyroid disease - Well controlled on current regimen.  - Thyroid Panel With TSH  5. Morbid obesity ([place]) - Patient has lost 7 lbs since his visit last month. Encouraged diet and exercise.    Return in about 6 months (around 03/25/2021) for follow-up of chronic medication conditions.  Hendricks Limes, MSN, APRN, FNP-C [facility]  Subjective:    Patient ID: Daniel Scott, male    DOB: [birth year], 60 y.o.   MRN: [id number]  Patient Care Team: Loman Brooklyn, FNP as PCP - General (Family Medicine)   Chief Complaint:  Chief Complaint  Patient presents with  . Diabetes    3 month follow up of chronic medical conditions    HPI: Daniel Scott is a 60 y.o. male presenting on 09/25/2020 for Diabetes (3 month follow up of chronic medical conditions)  Diabetes: Patient presents for follow up of diabetes. Current symptoms include: none. Known diabetic complications: none. Medication compliance: yes. Current diet: "average". Current exercise: none. Is he  on ACE inhibitor or angiotensin II receptor blocker? Yes. Is he on a statin?  Yes.   Lab Results  Component Value Date   HGBA1C 6.8 09/25/2020   HGBA1C 6.8 (H) 05/29/2020   HGBA1C 7.1 (H) 03/10/2020   Lab Results  Component Value Date   LDLCALC 67 12/09/2019   CREATININE 0.96 06/03/2020     New complaints: None  Social history:  Relevant past medical, surgical, family and social history reviewed and updated as indicated. Interim medical history since our last visit reviewed.  Allergies and medications reviewed and updated.  DATA REVIEWED: CHART IN EPIC  ROS: Negative unless specifically indicated above in HPI.    Current Outpatient Medications:  .  amLODipine (NORVASC) 10 MG tablet, Take 1 tablet (10 mg total) by mouth daily., Disp: 90 tablet, Rfl: 1 .  atorvastatin (LIPITOR) 80 MG tablet, Take 1 tablet (80 mg total) by mouth daily., Disp: 90 tablet, Rfl: 1 .  Cholecalciferol (DIALYVITE VITAMIN D 5000) 125 MCG (5000 UT) capsule, Take 5,000 Units by mouth daily., Disp: , Rfl:  .  Cyanocobalamin (B-12) 5000 MCG CAPS, Take 5,000 mcg by mouth daily., Disp: , Rfl:  .  diazepam (VALIUM) 5 MG tablet, Take 5 mg by mouth daily as needed., Disp: , Rfl:  .  ertugliflozin L-PyroglutamicAc (STEGLATRO) 15 MG TABS tablet, Take 1 tablet (15 mg total) by mouth daily., Disp: 90 tablet, Rfl: 1 .  Garlic 8921 MG CAPS, Take 1,000 mg by mouth 2 (two) times daily., Disp: , Rfl:  .  HYDROcodone-acetaminophen (NORCO/VICODIN) 5-325 MG tablet, Take 1-2 tablets by mouth every 4 (four) hours as  needed for moderate pain ((score 4 to 6))., Disp: 30 tablet, Rfl: 0 .  Krill Oil 500 MG CAPS, Take 500 mg by mouth daily., Disp: , Rfl:  .  levothyroxine (SYNTHROID) 75 MCG tablet, Take 1 tablet (75 mcg total) by mouth daily before breakfast., Disp: 90 tablet, Rfl: 1 .  lisinopril (ZESTRIL) 20 MG tablet, Take 1 tablet (20 mg total) by mouth daily., Disp: 90 tablet, Rfl: 1 .  metFORMIN (GLUCOPHAGE) 500 MG tablet, Take 1 tablet (500 mg total) by mouth 2 (two) times daily with a meal., Disp:  180 tablet, Rfl: 1 .  methocarbamol (ROBAXIN) 500 MG tablet, Take 1 tablet (500 mg total) by mouth every 8 (eight) hours as needed for muscle spasms., Disp: 60 tablet, Rfl: 1 .  Multiple Vitamin (MULTIVITAMIN ADULT PO), Take 1 tablet by mouth daily. , Disp: , Rfl:  .  omeprazole (PRILOSEC) 20 MG capsule, Take 1 capsule (20 mg total) by mouth every evening., Disp: 90 capsule, Rfl: 1 .  spironolactone (ALDACTONE) 25 MG tablet, Take 1 tablet (25 mg total) by mouth daily., Disp: 90 tablet, Rfl: 1 .  TRULICITY 3 GG/2.6RS SOPN, INJECT 0.5 MLS (3 MG TOTAL) AS DIRECTED ONCE A WEEK, Disp: 2 mL, Rfl: 0   Allergies  Allergen Reactions  . Sulfa Antibiotics    Past Medical History:  Diagnosis Date  . Arthritis    hands and feet  . Depression   . Diabetes mellitus without complication ([place])    type 2  . Fatty liver   . GERD (gastroesophageal reflux disease)   . Hiatal hernia   . Hyperlipidemia   . Hypertension   . Hypothyroidism   . Neuromuscular disorder ([place])    stenosis  . Pneumonia 1990  . Stenosis of cervical spine   . Thyroid disease     Past Surgical History:  Procedure Laterality Date  . ANTERIOR CERVICAL DECOMP/DISCECTOMY FUSION N/A 06/02/2020   Procedure: ANTERIOR CERVICAL DECOMPRESSION/DISCECTOMY FUSIONCERVICAL FOUR- CERVICAL FIVE, CERVICAL FIVE- CERVICAL SIX;  Surgeon: Dawley, Theodoro Doing, DO;  Location: [facility];  Service: Neurosurgery;  Laterality: N/A;  ANTERIOR CERVICAL DECOMPRESSION/DISCECTOMY FUSIONCERVICAL FOUR- CERVICAL FIVE, CERVICAL FIVE- CERVICAL SIX  . CARDIAC CATHETERIZATION  1996; 2006   [facility]  . HYDROCELE EXCISION    . LAPAROSCOPIC GASTRIC BANDING    . LAPAROSCOPIC ROUX-EN-Y GASTRIC BYPASS WITH UPPER ENDOSCOPY AND REMOVAL OF LAP BAND  02/2012  . TONSILLECTOMY      Social History   Socioeconomic History  . Marital status: Married    Spouse name: Not on file  . Number of children: Not on file  . Years of education: Not on file  . Highest  education level: Not on file  Occupational History  . Not on file  Tobacco Use  . Smoking status: Former Smoker    Types: Cigarettes    Quit date: 05/2009    Years since quitting: 11.3  . Smokeless tobacco: Never Used  Vaping Use  . Vaping Use: Never used  Substance and Sexual Activity  . Alcohol use: Yes    Comment: daily  . Drug use: Yes    Types: Marijuana    Comment: occ  . Sexual activity: Yes    Birth control/protection: None  Other Topics Concern  . Not on file  Social History Narrative  . Not on file   Social Determinants of Health   Financial Resource Strain: Not on file  Food Insecurity: Not on file  Transportation Needs: Not on  file  Physical Activity: Not on file  Stress: Not on file  Social Connections: Not on file  Intimate Partner Violence: Not on file        Objective:    BP 134/88   Pulse 99   Temp (!) 97.5 F (36.4 C) (Temporal)   Ht _0  (1.778 m)   Wt (!) 318 lb 3.2 oz (144.3 kg)   SpO2 94%   BMI 45.66 kg/m   Wt Readings from Last 3 Encounters:  09/25/20 (!) 318 lb 3.2 oz (144.3 kg)  09/02/20 (!) 325 lb (147.4 kg)  08/25/20 (!) 327 lb 4 oz (148.4 kg)    Physical Exam Vitals reviewed.  Constitutional:      General: He is not in acute distress.    Appearance: Normal appearance. He is morbidly obese. He is not ill-appearing, toxic-appearing or diaphoretic.  HENT:     Head: Normocephalic and atraumatic.  Eyes:     General: No scleral icterus.       Right eye: No discharge.        Left eye: No discharge.     Conjunctiva/sclera: Conjunctivae normal.  Cardiovascular:     Rate and Rhythm: Normal rate and regular rhythm.     Heart sounds: Normal heart sounds. No murmur heard. No friction rub. No gallop.   Pulmonary:     Effort: Pulmonary effort is normal. No respiratory distress.     Breath sounds: Normal breath sounds. No stridor. No wheezing, rhonchi or rales.  Musculoskeletal:        General: Normal range of motion.      Cervical back: Normal range of motion.  Skin:    General: Skin is warm and dry.  Neurological:     Mental Status: He is alert and oriented to person, place, and time. Mental status is at baseline.  Psychiatric:        Mood and Affect: Mood normal.        Behavior: Behavior normal.        Thought Content: Thought content normal.        Judgment: Judgment normal.     Lab Results  Component Value Date   TSH 2.850 12/09/2019   Lab Results  Component Value Date   WBC 11.6 (H) 06/02/2020   HGB 14.5 06/02/2020   HCT 44.0 06/02/2020   MCV 94.0 06/02/2020   PLT 238 06/02/2020   Lab Results  Component Value Date   NA 135 06/03/2020   K 4.0 06/03/2020   CO2 22 06/03/2020   GLUCOSE 170 (H) 06/03/2020   BUN 17 06/03/2020   CREATININE 0.96 06/03/2020   BILITOT 0.7 05/29/2020   ALKPHOS 61 05/29/2020   AST 51 (H) 05/29/2020   ALT 65 (H) 05/29/2020   PROT 6.6 05/29/2020   ALBUMIN 4.1 05/29/2020   CALCIUM 9.0 06/03/2020   ANIONGAP 13 06/03/2020   Lab Results  Component Value Date   CHOL 136 12/09/2019   Lab Results  Component Value Date   HDL 45 12/09/2019   Lab Results  Component Value Date   LDLCALC 67 12/09/2019   Lab Results  Component Value Date   TRIG 140 12/09/2019   Lab Results  Component Value Date   CHOLHDL 3.0 12/09/2019   Lab Results  Component Value Date   HGBA1C 6.8 (H) 05/29/2020

## 2020-09-26 LAB — CBC WITH DIFFERENTIAL/PLATELET
Basophils Absolute: 0 10*3/uL (ref 0.0–0.2)
Basos: 1 %
EOS (ABSOLUTE): 0.1 10*3/uL (ref 0.0–0.4)
Eos: 2 %
Hematocrit: 43.5 % (ref 37.5–51.0)
Hemoglobin: 14.8 g/dL (ref 13.0–17.7)
Immature Grans (Abs): 0 10*3/uL (ref 0.0–0.1)
Immature Granulocytes: 0 %
Lymphocytes Absolute: 1.3 10*3/uL (ref 0.7–3.1)
Lymphs: 24 %
MCH: 31.6 pg (ref 26.6–33.0)
MCHC: 34 g/dL (ref 31.5–35.7)
MCV: 93 fL (ref 79–97)
Monocytes Absolute: 0.5 10*3/uL (ref 0.1–0.9)
Monocytes: 9 %
Neutrophils Absolute: 3.5 10*3/uL (ref 1.4–7.0)
Neutrophils: 64 %
Platelets: 278 10*3/uL (ref 150–450)
RBC: 4.68 x10E6/uL (ref 4.14–5.80)
RDW: 13.2 % (ref 11.6–15.4)
WBC: 5.3 10*3/uL (ref 3.4–10.8)

## 2020-09-26 LAB — CMP14+EGFR
ALT: 23 IU/L (ref 0–44)
AST: 18 IU/L (ref 0–40)
Albumin/Globulin Ratio: 2 (ref 1.2–2.2)
Albumin: 4.3 g/dL (ref 3.8–4.8)
Alkaline Phosphatase: 94 IU/L (ref 44–121)
BUN/Creatinine Ratio: 15 (ref 10–24)
BUN: 13 mg/dL (ref 8–27)
Bilirubin Total: 0.5 mg/dL (ref 0.0–1.2)
CO2: 20 mmol/L (ref 20–29)
Calcium: 9.6 mg/dL (ref 8.6–10.2)
Chloride: 100 mmol/L (ref 96–106)
Creatinine, Ser: 0.87 mg/dL (ref 0.76–1.27)
GFR calc Af Amer: 108 mL/min/{1.73_m2} (ref >59)
GFR calc non Af Amer: 93 mL/min/{1.73_m2} (ref >59)
Globulin, Total: 2.2 g/dL (ref 1.5–4.5)
Glucose: 146 mg/dL — ABNORMAL HIGH (ref 65–99)
Potassium: 5.4 mmol/L — ABNORMAL HIGH (ref 3.5–5.2)
Sodium: 137 mmol/L (ref 134–144)
Total Protein: 6.5 g/dL (ref 6.0–8.5)

## 2020-09-26 LAB — LIPID PANEL
Chol/HDL Ratio: 3.7 ratio (ref 0.0–5.0)
Cholesterol, Total: 151 mg/dL (ref 100–199)
HDL: 41 mg/dL (ref >39)
LDL Chol Calc (NIH): 84 mg/dL (ref 0–99)
Triglycerides: 146 mg/dL (ref 0–149)
VLDL Cholesterol Cal: 26 mg/dL (ref 5–40)

## 2020-09-26 LAB — PSA, TOTAL AND FREE
PSA, Free Pct: 20.6 %
PSA, Free: 0.64 ng/mL
Prostate Specific Ag, Serum: 3.1 ng/mL (ref 0.0–4.0)

## 2020-09-26 LAB — THYROID PANEL WITH TSH
Free Thyroxine Index: 2.6 (ref 1.2–4.9)
T3 Uptake Ratio: 29 % (ref 24–39)
T4, Total: 9 ug/dL (ref 4.5–12.0)
TSH: 1.38 u[IU]/mL (ref 0.450–4.500)

## 2020-09-27 ENCOUNTER — Other Ambulatory Visit: Payer: Self-pay | Admitting: Family

## 2020-09-27 DIAGNOSIS — Z794 Long term (current) use of insulin: Secondary | ICD-10-CM

## 2020-09-27 DIAGNOSIS — E1165 Type 2 diabetes mellitus with hyperglycemia: Secondary | ICD-10-CM

## 2020-09-28 ENCOUNTER — Other Ambulatory Visit: Payer: Self-pay | Admitting: Family Medicine

## 2020-09-28 DIAGNOSIS — E875 Hyperkalemia: Secondary | ICD-10-CM

## 2020-10-11 ENCOUNTER — Other Ambulatory Visit: Payer: PRIVATE HEALTH INSURANCE

## 2020-11-03 ENCOUNTER — Other Ambulatory Visit: Payer: Self-pay

## 2020-11-03 ENCOUNTER — Encounter (HOSPITAL_COMMUNITY): Payer: Self-pay

## 2020-11-03 ENCOUNTER — Emergency Department (HOSPITAL_COMMUNITY)
Admission: EM | Admit: 2020-11-03 | Discharge: 2020-11-04 | Disposition: A | Discharge status: Home / Self Care | Payer: PRIVATE HEALTH INSURANCE | Attending: Emergency Medicine | Admitting: Emergency Medicine

## 2020-11-03 ENCOUNTER — Emergency Department (HOSPITAL_COMMUNITY): Payer: PRIVATE HEALTH INSURANCE

## 2020-11-03 DIAGNOSIS — Z794 Long term (current) use of insulin: Secondary | ICD-10-CM | POA: Insufficient documentation

## 2020-11-03 DIAGNOSIS — E039 Hypothyroidism, unspecified: Secondary | ICD-10-CM | POA: Insufficient documentation

## 2020-11-03 DIAGNOSIS — I10 Essential (primary) hypertension: Secondary | ICD-10-CM | POA: Insufficient documentation

## 2020-11-03 DIAGNOSIS — R531 Weakness: Secondary | ICD-10-CM | POA: Diagnosis present

## 2020-11-03 DIAGNOSIS — Z7984 Long term (current) use of oral hypoglycemic drugs: Secondary | ICD-10-CM | POA: Insufficient documentation

## 2020-11-03 DIAGNOSIS — Z79899 Other long term (current) drug therapy: Secondary | ICD-10-CM | POA: Insufficient documentation

## 2020-11-03 DIAGNOSIS — R000 Tachycardia, unspecified: Secondary | ICD-10-CM | POA: Insufficient documentation

## 2020-11-03 DIAGNOSIS — E119 Type 2 diabetes mellitus without complications: Secondary | ICD-10-CM | POA: Insufficient documentation

## 2020-11-03 DIAGNOSIS — Z8616 Personal history of COVID-19: Secondary | ICD-10-CM | POA: Insufficient documentation

## 2020-11-03 DIAGNOSIS — Z87891 Personal history of nicotine dependence: Secondary | ICD-10-CM | POA: Diagnosis not present

## 2020-11-03 LAB — BASIC METABOLIC PANEL
Anion gap: 12 (ref 5–15)
BUN: 11 mg/dL (ref 8–23)
CO2: 23 mmol/L (ref 22–32)
Calcium: 9.5 mg/dL (ref 8.9–10.3)
Chloride: 102 mmol/L (ref 98–111)
Creatinine, Ser: 1.19 mg/dL (ref 0.61–1.24)
GFR, Estimated: >60 mL/min (ref >60)
Glucose, Bld: 106 mg/dL — ABNORMAL HIGH (ref 70–99)
Potassium: 4.3 mmol/L (ref 3.5–5.1)
Sodium: 137 mmol/L (ref 135–145)

## 2020-11-03 LAB — TROPONIN I (HIGH SENSITIVITY)
Troponin I (High Sensitivity): 5 ng/L (ref <18)
Troponin I (High Sensitivity): 6 ng/L (ref <18)

## 2020-11-03 LAB — CBC WITH DIFFERENTIAL/PLATELET
Abs Immature Granulocytes: 0.09 10*3/uL — ABNORMAL HIGH (ref 0.00–0.07)
Basophils Absolute: 0 10*3/uL (ref 0.0–0.1)
Basophils Relative: 0 %
Eosinophils Absolute: 0 10*3/uL (ref 0.0–0.5)
Eosinophils Relative: 0 %
HCT: 43.9 % (ref 39.0–52.0)
Hemoglobin: 13.8 g/dL (ref 13.0–17.0)
Immature Granulocytes: 1 %
Lymphocytes Relative: 7 %
Lymphs Abs: 0.7 10*3/uL (ref 0.7–4.0)
MCH: 31.8 pg (ref 26.0–34.0)
MCHC: 31.4 g/dL (ref 30.0–36.0)
MCV: 101.2 fL — ABNORMAL HIGH (ref 80.0–100.0)
Monocytes Absolute: 0.8 10*3/uL (ref 0.1–1.0)
Monocytes Relative: 8 %
Neutro Abs: 8.5 10*3/uL — ABNORMAL HIGH (ref 1.7–7.7)
Neutrophils Relative %: 84 %
Platelets: 241 10*3/uL (ref 150–400)
RBC: 4.34 MIL/uL (ref 4.22–5.81)
RDW: 13.7 % (ref 11.5–15.5)
WBC: 10.2 10*3/uL (ref 4.0–10.5)
nRBC: 0 % (ref 0.0–0.2)

## 2020-11-03 LAB — MAGNESIUM: Magnesium: 2 mg/dL (ref 1.7–2.4)

## 2020-11-03 LAB — CK: Total CK: 135 U/L (ref 49–397)

## 2020-11-03 IMAGING — CT CT ANGIO CHEST
2 of 6 series · 18 of 46 positions shown · IV contrast (omnipaque)
Comparison: None.

CLINICAL DATA: Prior history of COVID, fatigue

EXAM:
CT ANGIOGRAPHY CHEST WITH CONTRAST
TECHNIQUE: Multidetector CT imaging of the chest was performed using the
standard protocol during bolus administration of intravenous
contrast. Multiplanar CT image reconstructions and MIPs were
obtained to evaluate the vascular anatomy.
CONTRAST:  100mL OMNIPAQUE IOHEXOL 300 MG/ML  SOLN

[Series 6: pe axial thins · axial · 0.74mm/px · z∈[+1299,+1552]mm · 15 of 279 slices shown]
[im 13/279  lung]
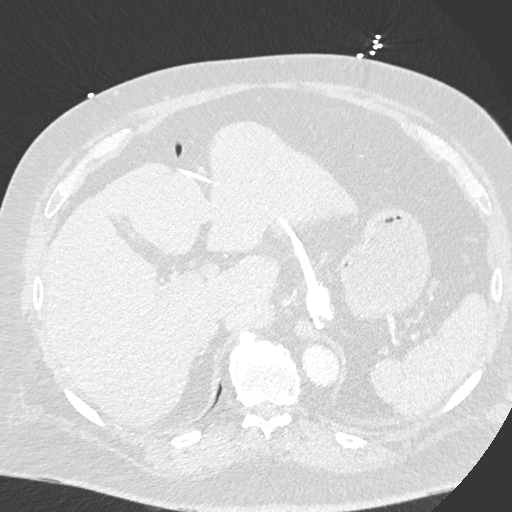
[im 37/279  soft-tissue]
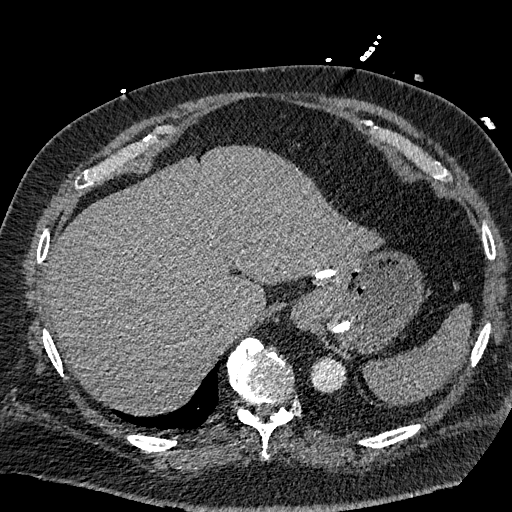
[im 49/279  lung]
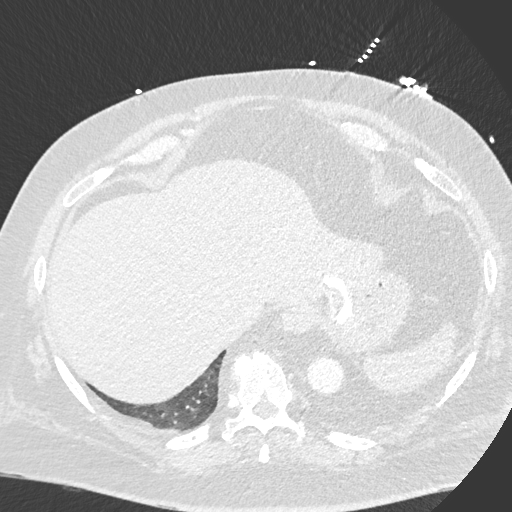
[im 73/279  soft-tissue]
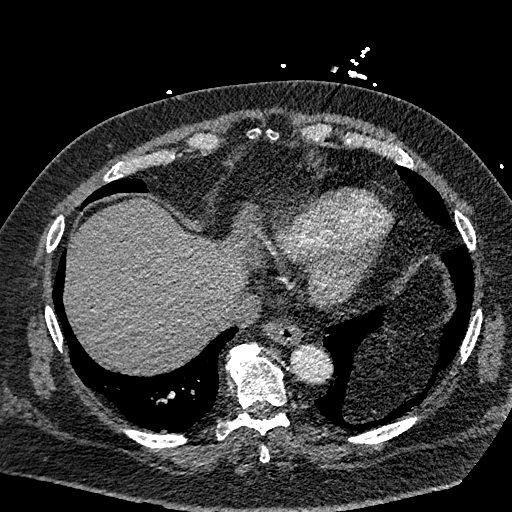
[im 85/279  lung]
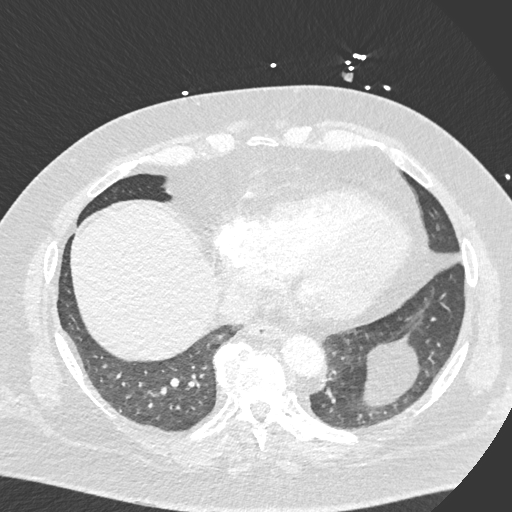
[im 109/279  soft-tissue]
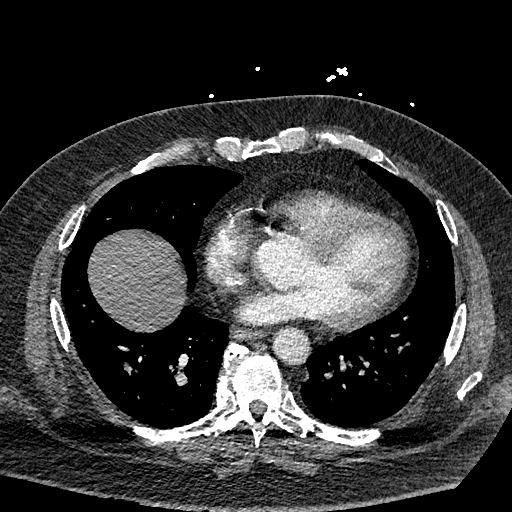
[im 121/279  lung]
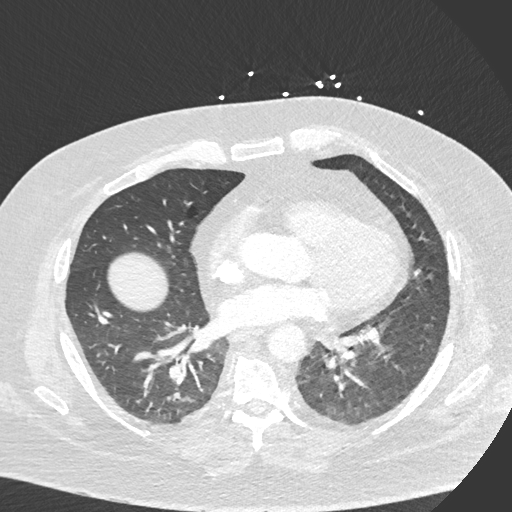
[im 146/279  soft-tissue]
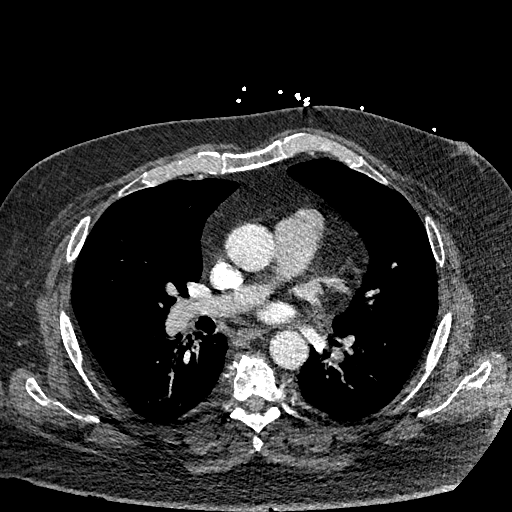
[im 158/279  lung]
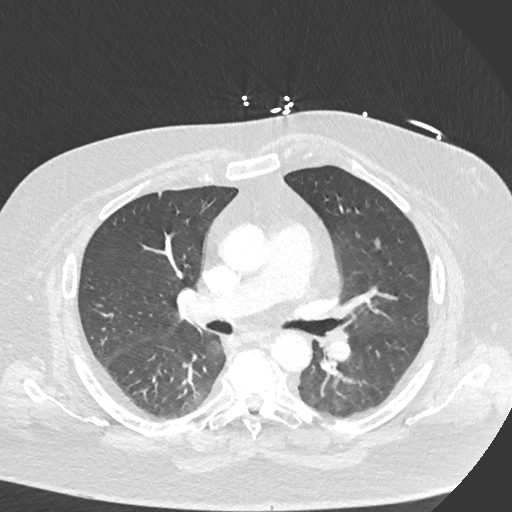
[im 170/279  soft-tissue]
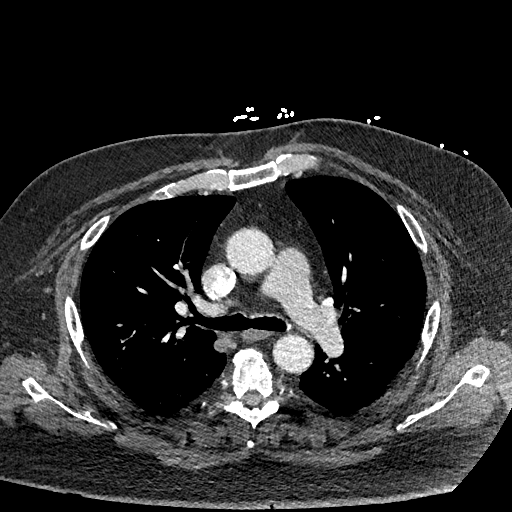
[im 194/279  lung]
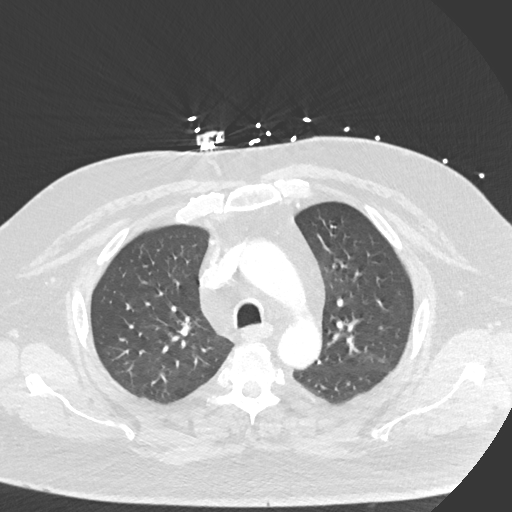
[im 206/279  soft-tissue]
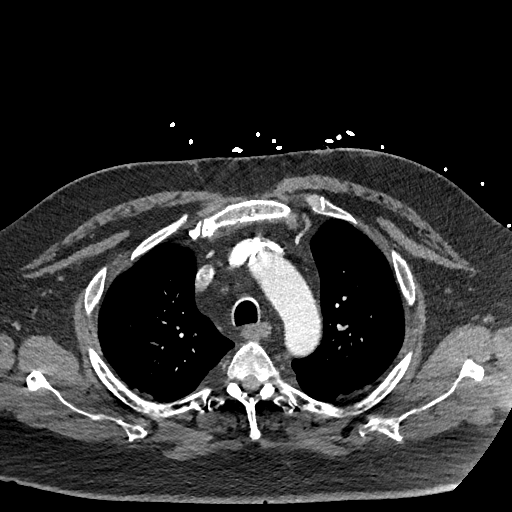
[im 230/279  lung]
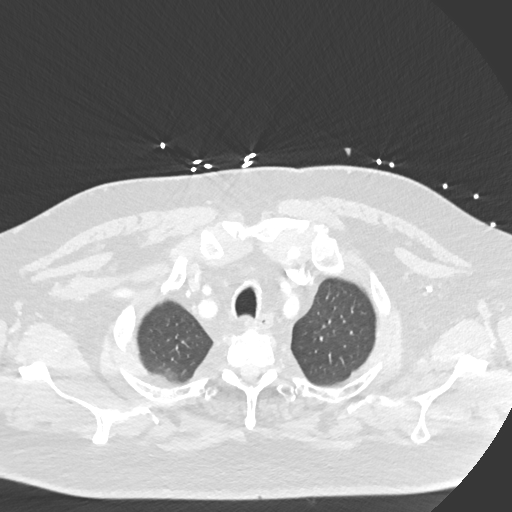
[im 242/279  soft-tissue]
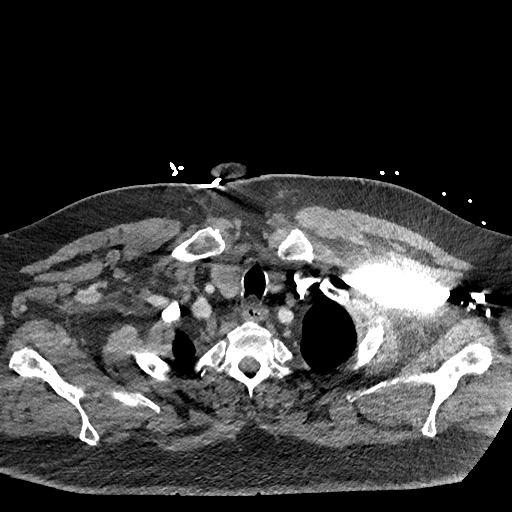
[im 266/279  lung]
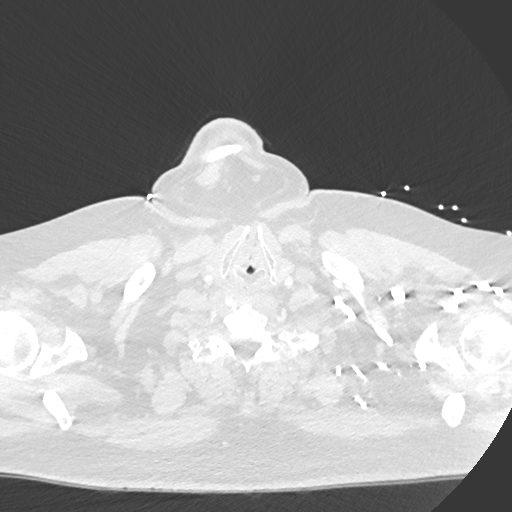

[Series 9: cor soft · coronal · 0.59mm/px · 3 of 151 slices shown]
[im 38/151  soft-tissue]
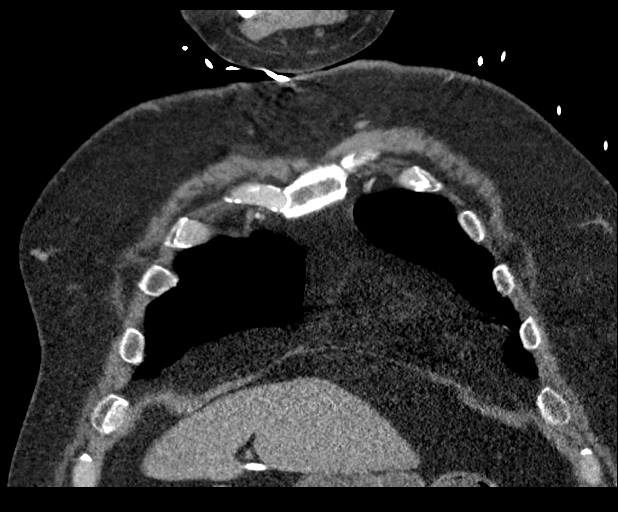
[im 76/151  soft-tissue]
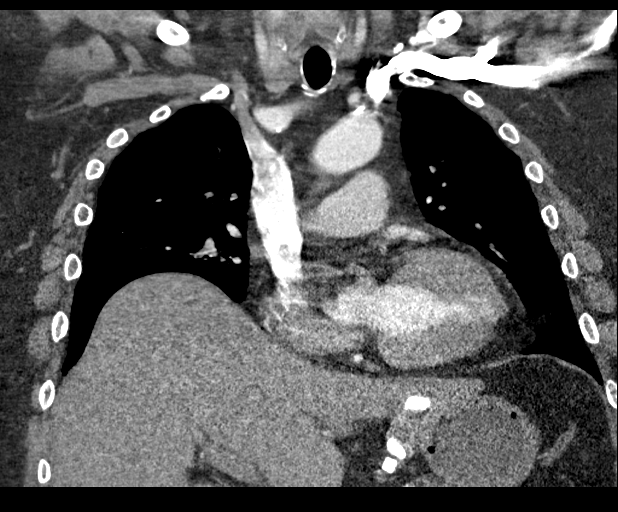
[im 113/151  soft-tissue]
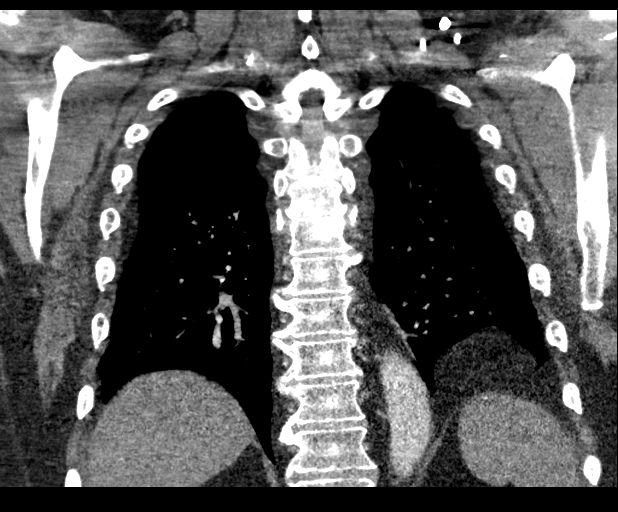

[18 of 46 positions shown; findings below may reference images not displayed]

FINDINGS: Cardiovascular: There is slightly suboptimal opacification of the
main pulmonary artery, however no central or proximal segmental
pulmonary embolism. There is mild cardiomegaly. No pericardial
effusion or thickening. No evidence right heart strain. There is
normal three-vessel brachiocephalic anatomy without proximal
stenosis. No aortic aneurysmal dilatation is seen. There is
scattered mild aortic atherosclerosis however. Coronary artery
calcifications are noted.

Mediastinum/Nodes: No hilar, mediastinal, or axillary adenopathy.
Thyroid gland, trachea, and esophagus demonstrate no significant
findings.

Lungs/Pleura: The lungs are clear. No pleural effusion or
pneumothorax. No airspace consolidation.

Upper Abdomen: No acute abnormalities present in the visualized
portions of the upper abdomen. Gastric lap band is noted. There are
layering gallstones present.

Musculoskeletal: No chest wall abnormality. No acute or significant
osseous findings.

Review of the MIP images confirms the above findings.
IMPRESSION: Slightly suboptimal opacification of the main pulmonary artery,
however no central or proximal segmental pulmonary embolism

No acute intrathoracic pathology to explain the patient's symptoms

Aortic Atherosclerosis ([S5]-[S5]).

## 2020-11-03 IMAGING — DX DG CHEST 1V PORT
2 series · 2 of 2 positions shown · non-contrast
Comparison: [DATE]

CLINICAL DATA: Generalized weakness, recent [HD]

EXAM:
PORTABLE CHEST 1 VIEW

[chest ap grid (1 of 2)]
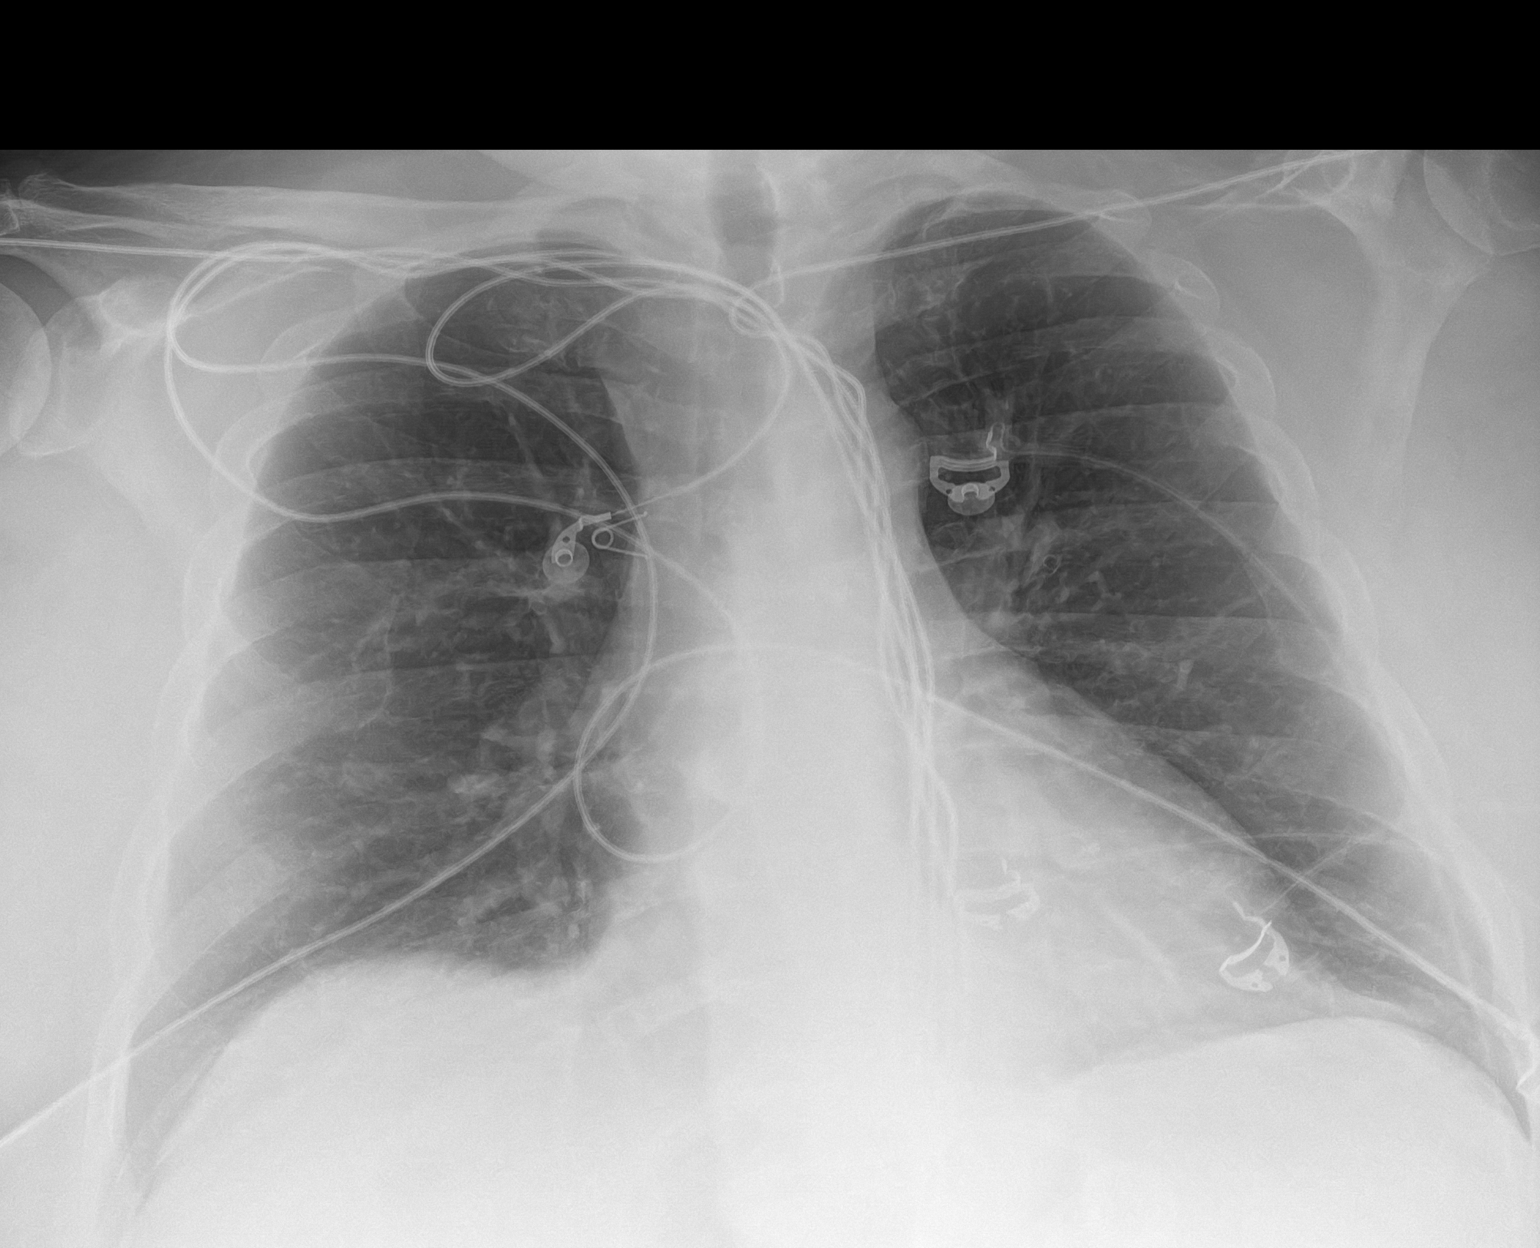

[chest ap grid (2 of 2)]
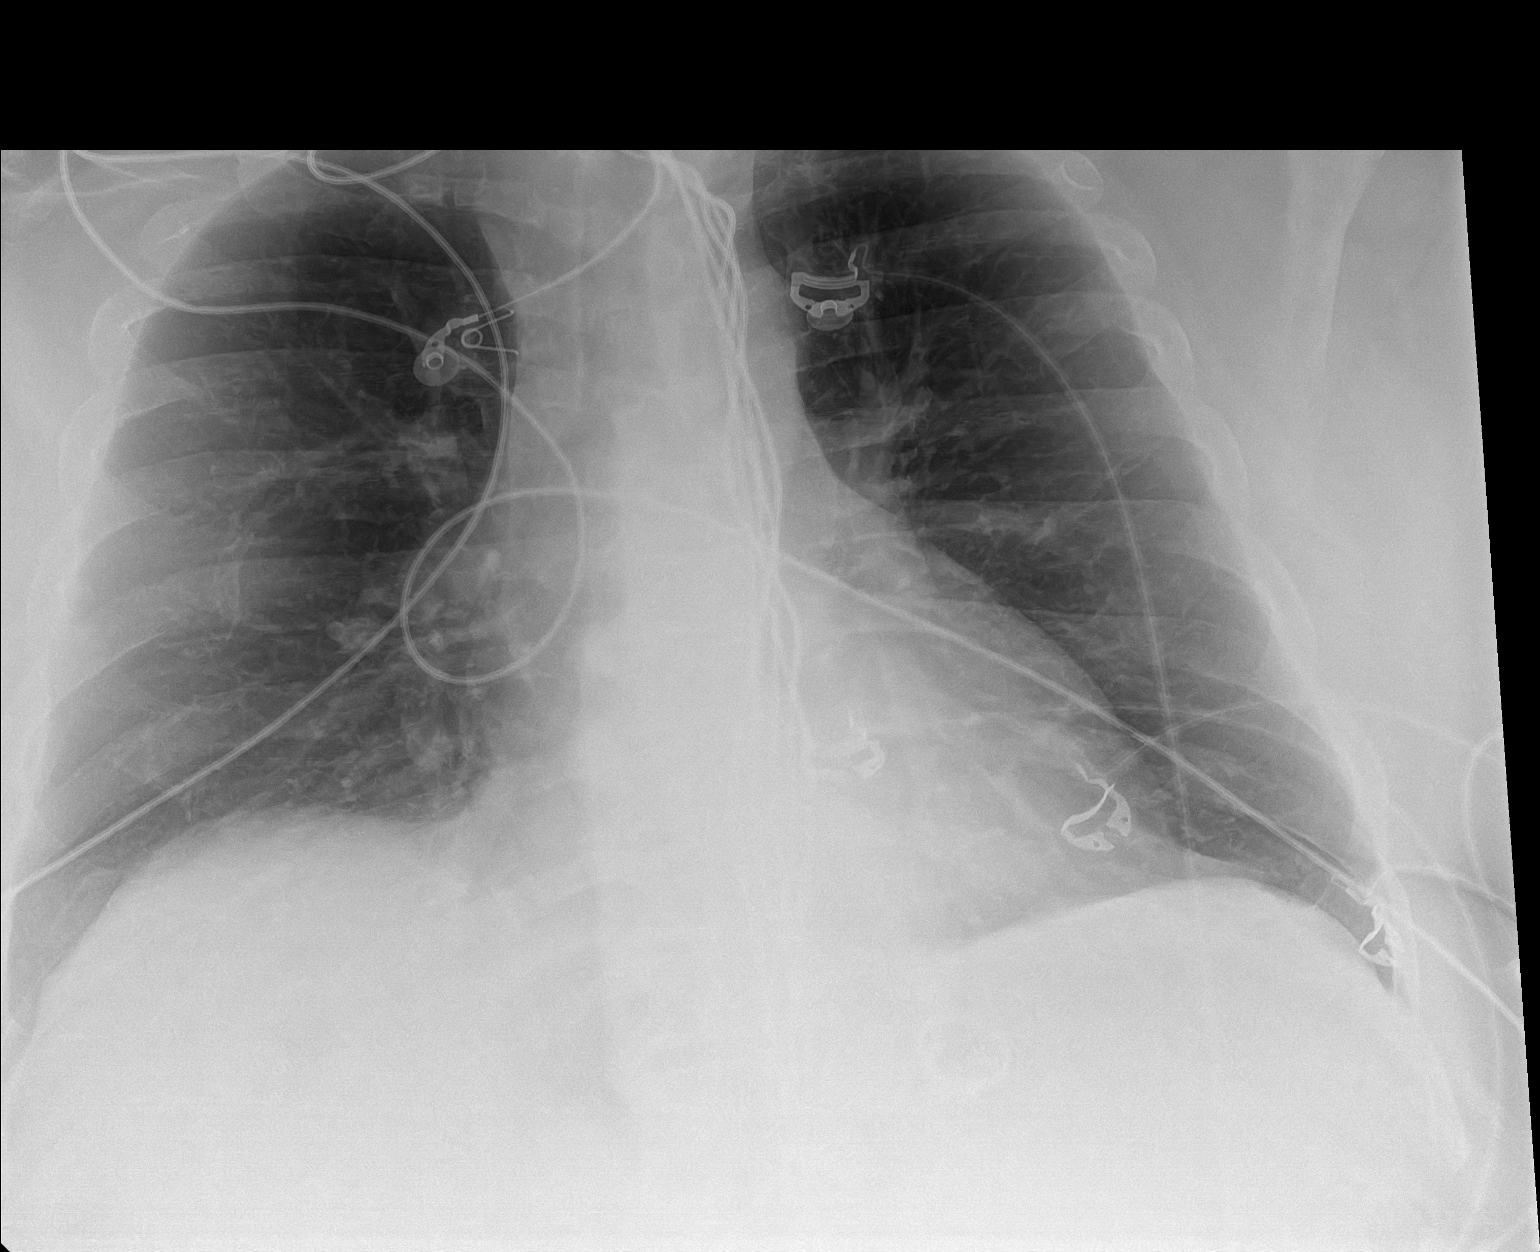

[2 of 2 positions shown; findings below may reference images not displayed]

FINDINGS: 2 frontal views of the chest demonstrate an unremarkable cardiac
silhouette. No airspace disease, effusion, or pneumothorax. No acute
bony abnormalities.
IMPRESSION: 1. No acute intrathoracic process.

## 2020-11-03 MED ORDER — IOHEXOL 300 MG/ML  SOLN
100.0000 mL | Freq: Once | INTRAMUSCULAR | Status: AC | PRN
  Administered 2020-11-03: 100 mL via INTRAVENOUS

## 2020-11-03 MED ORDER — SODIUM CHLORIDE 0.9 % IV BOLUS
500.0000 mL | Freq: Once | INTRAVENOUS | Status: AC
  Administered 2020-11-03: 500 mL via INTRAVENOUS

## 2020-11-03 MED ORDER — SODIUM CHLORIDE 0.9 % IV BOLUS: 1000.0000 mL | Freq: Once | INTRAVENOUS | Status: DC

## 2020-11-03 NOTE — ED Provider Notes (Signed)
[facility]GENCY [facility]PARTMENT Provider Note   CSN: [id number] Arrival date & time: 11/03/20  1727     History Chief Complaint  Patient presents with  . Weakness    Daniel Scott is a 60 y.o. male history of type 2 diabetes, obesity, GERD, hypertension, hyperlipidemia.  Patient reports 2 weeks ago he was diagnosed with COVID-19.  He reports he had fatigue generalized weakness and body aches during his infection.  He stayed out of work for around 2 weeks finally feeling well enough to go back a few days ago on light duty.  Today he felt well enough to exert himself at work, he climbed down into a 12 foot deep hole on a ladder to check a pipe.  He then was trying to get back out of the hole but felt that he was too weak to pull himself up the ladder.  He reports that he tried several times to get up out of the hole unsuccessfully.  He reports this was due to feeling generally weak.  He reports that the fire department was called to pull him out of the hole, after a short time of resting with fire department and EMS he has began to feel better.  Patient denies fever/chills, fall/injury, headache, vision changes, unilateral weakness, neck pain, chest pain, shortness of breath, abdominal pain, nausea/vomiting or any additional concerns.  HPI     Past Medical History:  Diagnosis Date  . Arthritis    hands and feet  . Depression   . Diabetes mellitus without complication (HCC)    type 2  . Fatty liver   . GERD (gastroesophageal reflux disease)   . Hiatal hernia   . Hyperlipidemia   . Hypertension   . Hypothyroidism   . Neuromuscular disorder (HCC)    stenosis  . Pneumonia 1990  . Stenosis of cervical spine   . Thyroid disease     Patient Active Problem List   Diagnosis Date Noted  . Essential hypertension 06/13/2020  . Type 2 diabetes mellitus with hyperglycemia, with long-term current use of insulin (HCC) 03/10/2020  . Morbid obesity (HCC) 03/10/2020  . Hyperlipidemia   .  Thyroid disease   . GERD (gastroesophageal reflux disease)     Past Surgical History:  Procedure Laterality Date  . ANTERIOR CERVICAL DECOMP/DISCECTOMY FUSION N/A 06/02/2020   Procedure: ANTERIOR CERVICAL DECOMPRESSION/DISCECTOMY FUSIONCERVICAL FOUR- CERVICAL FIVE, CERVICAL FIVE- CERVICAL SIX;  Surgeon: Dawley, Alan Mulderroy C, DO;  Location: [facility] OR;  Service: Neurosurgery;  Laterality: N/A;  ANTERIOR CERVICAL DECOMPRESSION/DISCECTOMY FUSIONCERVICAL FOUR- CERVICAL FIVE, CERVICAL FIVE- CERVICAL SIX  . CARDIAC CATHETERIZATION  1996; 2006   [facility]  . HYDROCELE EXCISION    . LAPAROSCOPIC GASTRIC BANDING    . LAPAROSCOPIC ROUX-EN-Y GASTRIC BYPASS WITH UPPER ENDOSCOPY AND REMOVAL OF LAP BAND  02/2012  . TONSILLECTOMY         Family History  Problem Relation Age of Onset  . Vascular Disease Mother   . Cancer Mother        Bone marrow  . Kidney disease Mother   . Thyroid disease Mother   . Hypertension Mother   . Diabetes Mother   . Coronary artery disease Father   . Colon cancer Father   . Heart disease Father   . Hyperlipidemia Father   . Hypertension Father   . Spina bifida Daughter   . Depression Son   . Anxiety disorder Son   . Hypertension Son   . Breast cancer Maternal  Grandmother   . Hypertension Maternal Grandmother   . Stroke Maternal Grandmother   . Lung cancer Maternal Grandfather   . Diabetes Maternal Grandfather   . Heart disease Maternal Grandfather   . Hypertension Maternal Grandfather   . Stroke Maternal Grandfather   . Hypertension Paternal Grandmother   . Heart disease Paternal Grandfather   . Hypertension Paternal Grandfather   . Autism Son   . Bipolar disorder Son   . Autism Son     Social History   Tobacco Use  . Smoking status: Former Smoker    Types: Cigarettes    Quit date: 05/2009    Years since quitting: 11.5  . Smokeless tobacco: Never Used  Vaping Use  . Vaping Use: Never used  Substance Use Topics  . Alcohol use: Yes     Comment: daily  . Drug use: Yes    Types: Marijuana    Comment: occ    Home Medications Prior to Admission medications   Medication Sig Start Date End Date Taking? Authorizing Provider  amLODipine (NORVASC) 10 MG tablet Take 1 tablet (10 mg total) by mouth daily. 06/11/20   Gwenlyn Fudge, FNP  atorvastatin (LIPITOR) 80 MG tablet Take 1 tablet (80 mg total) by mouth daily. 06/11/20   Gwenlyn Fudge, FNP  Cholecalciferol (DIALYVITE VITAMIN D 5000) 125 MCG (5000 UT) capsule Take 5,000 Units by mouth daily.    [provider]  Cyanocobalamin (B-12) 5000 MCG CAPS Take 5,000 mcg by mouth daily.    [provider]  diazepam (VALIUM) 5 MG tablet Take 5 mg by mouth daily as needed. 09/14/20   [provider]  ertugliflozin L-PyroglutamicAc (STEGLATRO) 15 MG TABS tablet Take 1 tablet (15 mg total) by mouth daily. 06/11/20   Gwenlyn Fudge, FNP  Garlic 1000 MG CAPS Take 1,000 mg by mouth 2 (two) times daily.    [provider]  HYDROcodone-acetaminophen (NORCO/VICODIN) 5-325 MG tablet Take 1-2 tablets by mouth every 4 (four) hours as needed for moderate pain ((score 4 to 6)). 06/03/20   Dawley, Troy C, DO  Krill Oil 500 MG CAPS Take 500 mg by mouth daily.    [provider]  levothyroxine (SYNTHROID) 75 MCG tablet Take 1 tablet (75 mcg total) by mouth daily before breakfast. 06/11/20   Gwenlyn Fudge, FNP  lisinopril (ZESTRIL) 20 MG tablet Take 1 tablet (20 mg total) by mouth daily. 06/11/20   Gwenlyn Fudge, FNP  metFORMIN (GLUCOPHAGE) 500 MG tablet Take 1 tablet (500 mg total) by mouth 2 (two) times daily with a meal. 06/11/20   Gwenlyn Fudge, FNP  methocarbamol (ROBAXIN) 500 MG tablet Take 1 tablet (500 mg total) by mouth every 8 (eight) hours as needed for muscle spasms. 06/11/20   Gwenlyn Fudge, FNP  Multiple Vitamin (MULTIVITAMIN ADULT PO) Take 1 tablet by mouth daily.     [provider]  omeprazole (PRILOSEC) 20 MG capsule Take 1  capsule (20 mg total) by mouth every evening. 06/11/20   Gwenlyn Fudge, FNP  spironolactone (ALDACTONE) 25 MG tablet Take 1 tablet (25 mg total) by mouth daily. 06/11/20   Gwenlyn Fudge, FNP  TRULICITY 3 MG/0.5ML SOPN INJECT 0.5 MLS (3 MG TOTAL) AS DIRECTED ONCE A WEEK 09/28/20   Jannifer Rodney A, FNP    Allergies    Sulfa antibiotics  Review of Systems   Review of Systems Ten systems are reviewed and are negative for acute change except as noted  in the HPI  Physical Exam Updated Vital Signs BP 115/74   Pulse (!) 116   Temp 97.8 F (36.6 C) (Oral)   Resp 20   Ht 5\' 10"  (1.778 m)   Wt (!) 142.9 kg   SpO2 99%   BMI 45.20 kg/m   Physical Exam Constitutional:      General: He is not in acute distress.    Appearance: Normal appearance. He is well-developed. He is not ill-appearing or diaphoretic.  HENT:     Head: Normocephalic and atraumatic.  Eyes:     General: Vision grossly intact. Gaze aligned appropriately.     Pupils: Pupils are equal, round, and reactive to light.  Neck:     Trachea: Trachea and phonation normal.  Cardiovascular:     Rate and Rhythm: Regular rhythm. Tachycardia present.     Pulses: Normal pulses.  Pulmonary:     Effort: Pulmonary effort is normal. No respiratory distress.     Breath sounds: Normal breath sounds.  Abdominal:     General: There is no distension.     Palpations: Abdomen is soft.     Tenderness: There is no abdominal tenderness. There is no guarding or rebound.  Musculoskeletal:        General: Normal range of motion.     Cervical back: Normal range of motion.  Skin:    General: Skin is warm and dry.  Neurological:     Mental Status: He is alert.     GCS: GCS eye subscore is 4. GCS verbal subscore is 5. GCS motor subscore is 6.     Comments: Speech is clear and goal oriented, follows commands Major Cranial nerves without deficit, no facial droop Normal strength in upper and lower extremities bilaterally including dorsiflexion  and plantar flexion, strong and equal grip strength Sensation normal to light and sharp touch Moves extremities without ataxia, coordination intact Normal finger to nose and rapid alternating movements No pronator drift, negative Romberg  Psychiatric:        Behavior: Behavior normal.     ED Results / Procedures / Treatments   Labs (all labs ordered are listed, but only abnormal results are displayed) Labs Reviewed - No data to display  EKG EKG Interpretation  Date/Time:  Tuesday November 03 2020 17:43:01 EST Ventricular Rate:  115 PR Interval:    QRS Duration: 87 QT Interval:  295 QTC Calculation: 408 R Axis:   10 Text Interpretation: Sinus tachycardia Low voltage, precordial leads Consider anterior infarct No significant change since last tracing PR now normal Confirmed by [id number] [phone]) on 11/03/2020 5:50:06 PM   Radiology No results found.  Procedures Procedures   Medications Ordered in ED Medications - No data to display  ED Course  I have reviewed the triage vital signs and the nursing notes.  Pertinent labs & imaging results that were available during my care of the patient were reviewed by me and considered in my medical decision making (see chart for details).    MDM Rules/Calculators/A&P                         Additional history obtained from: 1. Nursing notes from this visit. 2. Review of electronic medical records ------------------- I ordered, reviewed and interpreted labs which include: High-sensitivity troponin within normal limits x2 (5-6) CK within normal limits Magnesium within normal limits BMP shows no emergent electrolyte derangement AKI or gap. CBC shows no leukocytosis or anemia.  CXR:  IMPRESSION:  1. No acute intrathoracic process.   EKG: Sinus tachycardia Low voltage, precordial leads Consider anterior infarct No significant change since last tracing PR now normal Confirmed by Vanetta Mulders [phone]) on 11/03/2020 5:50:06  PM - Patient reassessed he is resting comfortably in bed no acute distress remains fully alert and oriented and neuro examination is unchanged.  Patient's symptoms today of fatigue after climbing down a ladder may be due to his deconditioning after COVID-19 infection.  Work-up so far has been reassuring.  Recheck vital signs show patient does remain tachycardic here in the ER is received IV fluids, will give additional IV fluids. Will obtain CT angio chest PE study to rule out pulmonary embolism as cause of tachycardia after Covid infection. Case discussed with Dr. Deretha Emory who agrees with plan.  Care handoff given to Tammy Triplett PA-C at shift change.  Plan of care is to follow-up on CT angio PE study and reassessment.  Pending no emergent findings anticipate discharge.  Final disposition per oncoming team.  Note: Portions of this report may have been transcribed using voice recognition software. Every effort was made to ensure accuracy; however, inadvertent computerized transcription errors may still be present. Final Clinical Impression(s) / ED Diagnoses Final diagnoses:  None    Rx / DC Orders ED Discharge Orders    None       Elizabeth Palau 11/03/20 2158    Vanetta Mulders, MD 11/11/20 [phone]

## 2020-11-03 NOTE — ED Notes (Signed)
Pt inquired as to what he is waiting on. Advised patient that he needs a recheck of troponin at 2000. Pt rolled his eyes and states "Okay".

## 2020-11-03 NOTE — ED Notes (Signed)
Advised pt that IV access with 20g was need in the arm for CT with contrast. Pt is frustrated and states "This is becoming tortorous". Advised pt that he can refuse the procedure that staff will not start IV without his consent. After consideration pt consents to IV for CT.

## 2020-11-03 NOTE — ED Triage Notes (Signed)
Pt presents to ED via [facility]. Pt returned to work after [employer] at [employer] and climbed into 12 foot hole, felt lost, fire was called and escorted pt out. Pt was weak once out of the hole. Pt states he has been tired since Covid. Pt states he is a little sick on his stomach and shaky.

## 2020-11-04 NOTE — Discharge Instructions (Addendum)
Your work-up this evening was reassuring.  The CT scan of your chest did not show evidence of a blood clot.  Follow-up with your primary care provider for recheck.

## 2020-11-04 NOTE — ED Provider Notes (Signed)
  Patient signed out to me by Harlene Salts, PA-C pending results of CT angio PE study.  Patient here with recent diagnosis of Covid returned to work several days ago.  Continues to have generalized weakness and mildly tachycardic.  Labs without evidence of emergent abnormality.  Chest x-ray was reassuring.  On recheck, pt resting comfortably.    CT angio this evening without evidence of PE.  Feel that patient is appropriate for discharge home, agrees to close follow-up PCP.      Pauline Aus, PA-C 11/04/20 0041    Vanetta Mulders, MD 11/12/20 [phone]

## 2020-12-09 ENCOUNTER — Other Ambulatory Visit: Payer: Self-pay | Admitting: Family Medicine

## 2020-12-09 DIAGNOSIS — E1165 Type 2 diabetes mellitus with hyperglycemia: Secondary | ICD-10-CM

## 2020-12-09 DIAGNOSIS — K219 Gastro-esophageal reflux disease without esophagitis: Secondary | ICD-10-CM

## 2020-12-09 DIAGNOSIS — Z794 Long term (current) use of insulin: Secondary | ICD-10-CM

## 2020-12-09 DIAGNOSIS — I10 Essential (primary) hypertension: Secondary | ICD-10-CM

## 2020-12-10 ENCOUNTER — Ambulatory Visit: Payer: PRIVATE HEALTH INSURANCE | Admitting: Family Medicine

## 2020-12-19 ENCOUNTER — Other Ambulatory Visit: Payer: Self-pay | Admitting: Family Medicine

## 2020-12-19 DIAGNOSIS — E1165 Type 2 diabetes mellitus with hyperglycemia: Secondary | ICD-10-CM

## 2020-12-19 DIAGNOSIS — Z794 Long term (current) use of insulin: Secondary | ICD-10-CM

## 2020-12-19 DIAGNOSIS — E782 Mixed hyperlipidemia: Secondary | ICD-10-CM

## 2020-12-19 DIAGNOSIS — I10 Essential (primary) hypertension: Secondary | ICD-10-CM

## 2020-12-19 DIAGNOSIS — E079 Disorder of thyroid, unspecified: Secondary | ICD-10-CM

## 2021-01-22 ENCOUNTER — Other Ambulatory Visit: Payer: Self-pay | Admitting: Family Medicine

## 2021-01-22 DIAGNOSIS — I10 Essential (primary) hypertension: Secondary | ICD-10-CM

## 2021-01-22 DIAGNOSIS — Z794 Long term (current) use of insulin: Secondary | ICD-10-CM

## 2021-01-22 DIAGNOSIS — E1165 Type 2 diabetes mellitus with hyperglycemia: Secondary | ICD-10-CM

## 2021-01-27 ENCOUNTER — Other Ambulatory Visit: Payer: Self-pay | Admitting: Family Medicine

## 2021-01-27 DIAGNOSIS — E1165 Type 2 diabetes mellitus with hyperglycemia: Secondary | ICD-10-CM

## 2021-01-27 DIAGNOSIS — Z794 Long term (current) use of insulin: Secondary | ICD-10-CM

## 2021-02-03 ENCOUNTER — Ambulatory Visit
Admission: EM | Admit: 2021-02-03 | Discharge: 2021-02-03 | Disposition: A | Discharge status: Home / Self Care | Payer: PRIVATE HEALTH INSURANCE

## 2021-02-03 ENCOUNTER — Other Ambulatory Visit: Payer: Self-pay

## 2021-02-03 DIAGNOSIS — Z8719 Personal history of other diseases of the digestive system: Secondary | ICD-10-CM

## 2021-02-03 DIAGNOSIS — R1031 Right lower quadrant pain: Secondary | ICD-10-CM

## 2021-02-03 NOTE — ED Triage Notes (Signed)
Pt presents with right groin pain that has been chronic but states that he had severe pain this morning

## 2021-02-03 NOTE — ED Provider Notes (Signed)
[facility]    CSN: [id number] Arrival date & time: 02/03/21  1497      History   Chief Complaint Chief Complaint  Patient presents with  . Groin Pain    HPI Daniel Scott is a 60 y.o. male.   Reports self diagnosed right inguinal hernia for the last 3 years. States that he recently push mowed his yard and thinks he aggravated the hernia or the muscles around it. Denies OTC treatment. Reports that the pain was worse this morning when he got up and put weight on the right leg. Denies known injury, obvious bulge to the area, any wounds, numbness, tingling, drainage from the area. Has not had the area evaluated in the past.   ROS per HPI  The history is provided by the patient.  Groin Pain    Past Medical History:  Diagnosis Date  . Arthritis    hands and feet  . Depression   . Diabetes mellitus without complication (HCC)    type 2  . Fatty liver   . GERD (gastroesophageal reflux disease)   . Hiatal hernia   . Hyperlipidemia   . Hypertension   . Hypothyroidism   . Neuromuscular disorder (HCC)    stenosis  . Pneumonia 1990  . Stenosis of cervical spine   . Thyroid disease     Patient Active Problem List   Diagnosis Date Noted  . Essential hypertension 06/13/2020  . Type 2 diabetes mellitus with hyperglycemia, with long-term current use of insulin (HCC) 03/10/2020  . Morbid obesity (HCC) 03/10/2020  . Hyperlipidemia   . Thyroid disease   . GERD (gastroesophageal reflux disease)     Past Surgical History:  Procedure Laterality Date  . ANTERIOR CERVICAL DECOMP/DISCECTOMY FUSION N/A 06/02/2020   Procedure: ANTERIOR CERVICAL DECOMPRESSION/DISCECTOMY FUSIONCERVICAL FOUR- CERVICAL FIVE, CERVICAL FIVE- CERVICAL SIX;  Surgeon: Dawley, Alan Mulder, DO;  Location: [facility];  Service: Neurosurgery;  Laterality: N/A;  ANTERIOR CERVICAL DECOMPRESSION/DISCECTOMY FUSIONCERVICAL FOUR- CERVICAL FIVE, CERVICAL FIVE- CERVICAL SIX  . CARDIAC CATHETERIZATION  1996; 2006   [facility]  . HYDROCELE EXCISION    . LAPAROSCOPIC GASTRIC BANDING    . LAPAROSCOPIC ROUX-EN-Y GASTRIC BYPASS WITH UPPER ENDOSCOPY AND REMOVAL OF LAP BAND  02/2012  . TONSILLECTOMY         Home Medications    Prior to Admission medications   Medication Sig Start Date End Date Taking? Authorizing Provider  amLODipine (NORVASC) 10 MG tablet TAKE 1 TABLET BY MOUTH EVERY DAY 12/21/20   Gwenlyn Fudge, FNP  aspirin 325 MG EC tablet Take 325 mg by mouth daily.    [provider]  atorvastatin (LIPITOR) 80 MG tablet TAKE 1 TABLET BY MOUTH EVERY DAY 12/21/20   [name] F, FNP  Cholecalciferol (DIALYVITE VITAMIN D 5000) 125 MCG (5000 UT) capsule Take 5,000 Units by mouth daily.    [provider]  Cyanocobalamin (B-12) 5000 MCG CAPS Take 5,000 mcg by mouth daily.    [provider]  diazepam (VALIUM) 5 MG tablet Take 5 mg by mouth daily as needed. Patient not taking: No sig reported 09/14/20   [provider]  Garlic 1000 MG CAPS Take 1,000 mg by mouth 2 (two) times daily.    [provider]  HYDROcodone-acetaminophen (NORCO/VICODIN) 5-325 MG tablet Take 1-2 tablets by mouth every 4 (four) hours as needed for moderate pain ((score 4 to 6)). Patient not taking: No sig reported 06/03/20   Dawley, Alan Mulder,  DO  Krill Oil 500 MG CAPS Take 500 mg by mouth daily.    [provider]  levothyroxine (SYNTHROID) 75 MCG tablet TAKE 1 TABLET BY MOUTH DAILY BEFORE BREAKFAST. 12/21/20   Gwenlyn FudgeJoyce, Britney F, FNP  lisinopril (ZESTRIL) 20 MG tablet TAKE 1 TABLET BY MOUTH EVERY DAY 12/09/20   Deliah BostonJoyce, Britney F, FNP  metFORMIN (GLUCOPHAGE) 500 MG tablet TAKE 1 TABLET BY MOUTH 2 TIMES DAILY WITH A MEAL. 01/22/21   Gwenlyn FudgeJoyce, Britney F, FNP  methocarbamol (ROBAXIN) 500 MG tablet Take 1 tablet (500 mg total) by mouth every 8 (eight) hours as needed for muscle spasms. Patient not taking: No sig reported 06/11/20   Gwenlyn FudgeJoyce, Britney F, FNP  Multiple Vitamin  (MULTIVITAMIN ADULT PO) Take 1 tablet by mouth daily.     [provider]  omeprazole (PRILOSEC) 20 MG capsule TAKE 1 CAPSULE BY MOUTH EVERY EVENING. 12/09/20   Deliah BostonJoyce, Britney F, FNP  spironolactone (ALDACTONE) 25 MG tablet TAKE 1 TABLET BY MOUTH EVERY DAY 01/22/21   Gwenlyn FudgeJoyce, Britney F, FNP  STEGLATRO 15 MG TABS tablet TAKE 1 TABLET BY MOUTH EVERY DAY 01/27/21   Gwenlyn FudgeJoyce, Britney F, FNP  TRULICITY 3 MG/0.5ML SOPN INJECT 0.5 MLS (3 MG TOTAL) AS DIRECTED ONCE A WEEK 09/28/20   Junie SpencerHawks, Christy A, FNP    Family History Family History  Problem Relation Age of Onset  . Vascular Disease Mother   . Cancer Mother        Bone marrow  . Kidney disease Mother   . Thyroid disease Mother   . Hypertension Mother   . Diabetes Mother   . Coronary artery disease Father   . Colon cancer Father   . Heart disease Father   . Hyperlipidemia Father   . Hypertension Father   . Spina bifida Daughter   . Depression Son   . Anxiety disorder Son   . Hypertension Son   . Breast cancer Maternal Grandmother   . Hypertension Maternal Grandmother   . Stroke Maternal Grandmother   . Lung cancer Maternal Grandfather   . Diabetes Maternal Grandfather   . Heart disease Maternal Grandfather   . Hypertension Maternal Grandfather   . Stroke Maternal Grandfather   . Hypertension Paternal Grandmother   . Heart disease Paternal Grandfather   . Hypertension Paternal Grandfather   . Autism Son   . Bipolar disorder Son   . Autism Son     Social History Social History   Tobacco Use  . Smoking status: Former Smoker    Types: Cigarettes    Quit date: 05/2009    Years since quitting: 11.7  . Smokeless tobacco: Never Used  Vaping Use  . Vaping Use: Never used  Substance Use Topics  . Alcohol use: Yes    Comment: daily  . Drug use: Yes    Types: Marijuana    Comment: occ     Allergies   Sulfa antibiotics   Review of Systems Review of Systems   Physical Exam Triage Vital Signs ED Triage Vitals  Enc  Vitals Group     BP 02/03/21 0833 133/81     Pulse Rate 02/03/21 0833 67     Resp 02/03/21 0833 20     Temp 02/03/21 0833 98 F (36.7 C)     Temp src --      SpO2 02/03/21 0833 97 %     Weight --      Height --      Head Circumference --  Peak Flow --      Pain Score 02/03/21 0831 4     Pain Loc --      Pain Edu? --      Excl. in GC? --    No data found.  Updated Vital Signs BP 133/81   Pulse 67   Temp 98 F (36.7 C)   Resp 20   SpO2 97%    Physical Exam Vitals and nursing note reviewed.  Constitutional:      General: He is not in acute distress.    Appearance: He is well-developed. He is obese. He is not ill-appearing.  HENT:     Head: Normocephalic and atraumatic.     Nose: Nose normal.     Mouth/Throat:     Mouth: Mucous membranes are moist.     Pharynx: Oropharynx is clear.  Eyes:     Extraocular Movements: Extraocular movements intact.     Conjunctiva/sclera: Conjunctivae normal.     Pupils: Pupils are equal, round, and reactive to light.  Cardiovascular:     Rate and Rhythm: Normal rate and regular rhythm.     Heart sounds: No murmur heard.   Pulmonary:     Effort: Pulmonary effort is normal. No respiratory distress.     Breath sounds: Normal breath sounds.  Abdominal:     Palpations: Abdomen is soft.     Tenderness: There is no abdominal tenderness.  Musculoskeletal:        General: Normal range of motion.     Cervical back: Normal range of motion and neck supple.  Skin:    General: Skin is warm and dry.     Capillary Refill: Capillary refill takes less than 2 seconds.  Neurological:     General: No focal deficit present.     Mental Status: He is alert and oriented to person, place, and time.  Psychiatric:        Mood and Affect: Mood normal.        Behavior: Behavior normal.        Thought Content: Thought content normal.      UC Treatments / Results  Labs (all labs ordered are listed, but only abnormal results are displayed) Labs  Reviewed - No data to display  EKG   Radiology No results found.  Procedures Procedures (including critical care time)  Medications Ordered in UC Medications - No data to display  Initial Impression / Assessment and Plan / UC Course  I have reviewed the triage vital signs and the nursing notes.  Pertinent labs & imaging results that were available during my care of the patient were reviewed by me and considered in my medical decision making (see chart for details).    Right Groin Pain  Hernia vs muscle strain Exam unremarkable for hernia, incarcerated hernia May take home muscle relaxers to see if this helps Follow up with PCP for [place] referral Follow up with the ER for acute worsening pain   Final Clinical Impressions(s) / UC Diagnoses   Final diagnoses:  Right groin pain  Hx of hiatal hernia     Discharge Instructions     Follow up with PCP for ultrasound referral  May use muscle relaxers to help relax the area  Follow up with this office or with primary care if symptoms are persisting.  Follow up in the ER for high fever, trouble swallowing, trouble breathing, other concerning symptoms.     ED Prescriptions    None  PDMP not reviewed this encounter.   Moshe Cipro, NP 02/03/21 [phone]

## 2021-02-03 NOTE — Discharge Instructions (Signed)
Follow up with PCP for ultrasound referral  May use muscle relaxers to help relax the area  Follow up with this office or with primary care if symptoms are persisting.  Follow up in the ER for high fever, trouble swallowing, trouble breathing, other concerning symptoms.

## 2021-02-16 ENCOUNTER — Encounter: Payer: Self-pay | Admitting: Family Medicine

## 2021-02-18 ENCOUNTER — Encounter: Payer: Self-pay | Admitting: Family Medicine

## 2021-02-18 ENCOUNTER — Ambulatory Visit (INDEPENDENT_AMBULATORY_CARE_PROVIDER_SITE_OTHER): Payer: PRIVATE HEALTH INSURANCE | Admitting: Family Medicine

## 2021-02-18 ENCOUNTER — Other Ambulatory Visit: Payer: Self-pay

## 2021-02-18 VITALS — BP 123/86 | HR 78 | Temp 96.9°F | Ht 70.0 in | Wt 323.8 lb

## 2021-02-18 DIAGNOSIS — M5030 Other cervical disc degeneration, unspecified cervical region: Secondary | ICD-10-CM | POA: Insufficient documentation

## 2021-02-18 DIAGNOSIS — R1031 Right lower quadrant pain: Secondary | ICD-10-CM | POA: Diagnosis not present

## 2021-02-18 NOTE — Progress Notes (Signed)
Assessment & Plan:  1. Right groin pain Continue Ibuprofen as needed for pain. Ultrasound to aid in determining cause of pain. Education provided on inguinal hernias for him to have if this is the cause of pain. - US Abdomen Limited; Future   Follow up plan: Return as scheduled.  [name], MSN, APRN, FNP-C [facility]  Subjective:   Patient ID: Daniel Scott, male    DOB: 06-18-1961, 61 y.o.   MRN: [id number]  HPI: Daniel Scott is a 60 y.o. male presenting on 02/18/2021 for Follow-up (Urgent care follow up-6/1 Rt groin pain)  Patient reports right groin pain that has been going on for at least 3 years. It has been worsening recently. Occasionally he requires Ibuprofen for pain, which is helpful.    ROS: Negative unless specifically indicated above in HPI.   Relevant past medical history reviewed and updated as indicated.   Allergies and medications reviewed and updated.   Current Outpatient Medications:    amLODipine (NORVASC) 10 MG tablet, TAKE 1 TABLET BY MOUTH EVERY DAY, Disp: 90 tablet, Rfl: 0   aspirin 325 MG EC tablet, Take 325 mg by mouth daily., Disp: , Rfl:    atorvastatin (LIPITOR) 80 MG tablet, TAKE 1 TABLET BY MOUTH EVERY DAY, Disp: 90 tablet, Rfl: 0   Cholecalciferol (DIALYVITE VITAMIN D 5000) 125 MCG (5000 UT) capsule, Take 5,000 Units by mouth daily., Disp: , Rfl:    Cyanocobalamin (B-12) 5000 MCG CAPS, Take 5,000 mcg by mouth daily., Disp: , Rfl:    Garlic 1000 MG CAPS, Take 1,000 mg by mouth 2 (two) times daily., Disp: , Rfl:    Krill Oil 500 MG CAPS, Take 500 mg by mouth daily., Disp: , Rfl:    levothyroxine (SYNTHROID) 75 MCG tablet, TAKE 1 TABLET BY MOUTH DAILY BEFORE BREAKFAST., Disp: 90 tablet, Rfl: 0   lisinopril (ZESTRIL) 20 MG tablet, TAKE 1 TABLET BY MOUTH EVERY DAY, Disp: 90 tablet, Rfl: 0   metFORMIN (GLUCOPHAGE) 500 MG tablet, TAKE 1 TABLET BY MOUTH 2 TIMES DAILY WITH A MEAL., Disp: 180 tablet, Rfl: 0   Multiple  Vitamin (MULTIVITAMIN ADULT PO), Take 1 tablet by mouth daily. , Disp: , Rfl:    omeprazole (PRILOSEC) 20 MG capsule, TAKE 1 CAPSULE BY MOUTH EVERY EVENING., Disp: 90 capsule, Rfl: 0   spironolactone (ALDACTONE) 25 MG tablet, TAKE 1 TABLET BY MOUTH EVERY DAY, Disp: 90 tablet, Rfl: 0   STEGLATRO 15 MG TABS tablet, TAKE 1 TABLET BY MOUTH EVERY DAY, Disp: 90 tablet, Rfl: 0   TRULICITY 3 MG/0.5ML SOPN, INJECT 0.5 MLS (3 MG TOTAL) AS DIRECTED ONCE A WEEK, Disp: 6.5 mL, Rfl: 1  Allergies  Allergen Reactions   Sulfa Antibiotics     Objective:   BP 123/86   Pulse 78   Temp (!) 96.9 F (36.1 C) (Temporal)   Ht 5\' 10"  (1.778 m)   Wt (!) 323 lb 12.8 oz (146.9 kg)   SpO2 95%   BMI 46.46 kg/m    Physical Exam Vitals reviewed.  Constitutional:      General: He is not in acute distress.    Appearance: Normal appearance. He is not ill-appearing, toxic-appearing or diaphoretic.  HENT:     Head: Normocephalic and atraumatic.  Eyes:     General: No scleral icterus.       Right eye: No discharge.        Left eye: No discharge.     Conjunctiva/sclera: Conjunctivae normal.  Cardiovascular:     Rate and Rhythm: Normal rate.  Pulmonary:     Effort: Pulmonary effort is normal. No respiratory distress.  Abdominal:     Hernia: No hernia is present.     Comments: Tenderness on palpation of right groin area.  Musculoskeletal:        General: Normal range of motion.     Cervical back: Normal range of motion.  Skin:    General: Skin is warm and dry.  Neurological:     Mental Status: He is alert and oriented to person, place, and time. Mental status is at baseline.  Psychiatric:        Mood and Affect: Mood normal.        Behavior: Behavior normal.        Thought Content: Thought content normal.        Judgment: Judgment normal.

## 2021-02-23 ENCOUNTER — Encounter: Payer: Self-pay | Admitting: Family Medicine

## 2021-02-23 ENCOUNTER — Telehealth: Payer: Self-pay | Admitting: Family Medicine

## 2021-02-23 DIAGNOSIS — R1031 Right lower quadrant pain: Secondary | ICD-10-CM

## 2021-02-23 NOTE — Telephone Encounter (Signed)
Ultrasound changed to pelvic as requested by ultrasound department.

## 2021-03-01 ENCOUNTER — Other Ambulatory Visit: Payer: Self-pay

## 2021-03-01 ENCOUNTER — Ambulatory Visit (HOSPITAL_COMMUNITY)
Admission: RE | Admit: 2021-03-01 | Discharge: 2021-03-01 | Disposition: A | Discharge status: Home / Self Care | Payer: No Typology Code available for payment source | Source: Ambulatory Visit | Attending: Family Medicine | Admitting: Family Medicine

## 2021-03-01 DIAGNOSIS — R1031 Right lower quadrant pain: Secondary | ICD-10-CM | POA: Insufficient documentation

## 2021-03-01 IMAGING — US US PELVIS LIMITED
1 series · 11 of 11 positions shown · non-contrast
Comparison: None.

CLINICAL DATA: Three years of right groin pain.

EXAM:
LIMITED ULTRASOUND OF PELVIS
TECHNIQUE: Limited transabdominal ultrasound examination of the pelvis was
performed.

[Series 1: us pelvis limited (transabdominal only) · 11 of 11 slices shown]
[im 1/11]
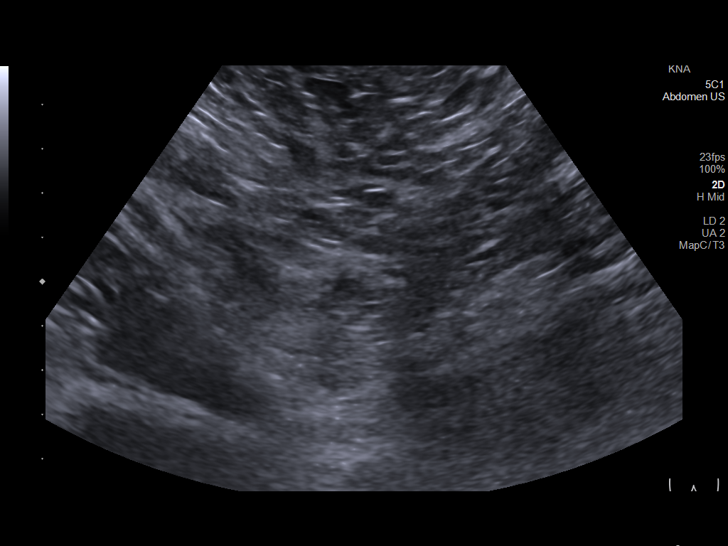
[im 2/11]
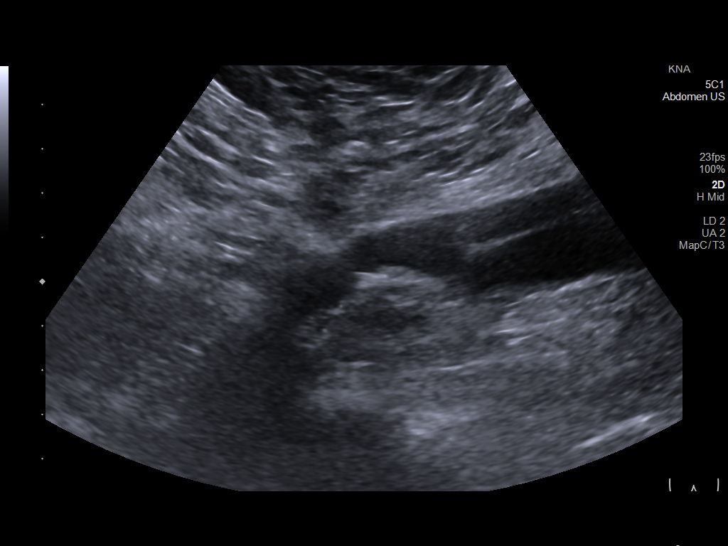
[im 3/11]
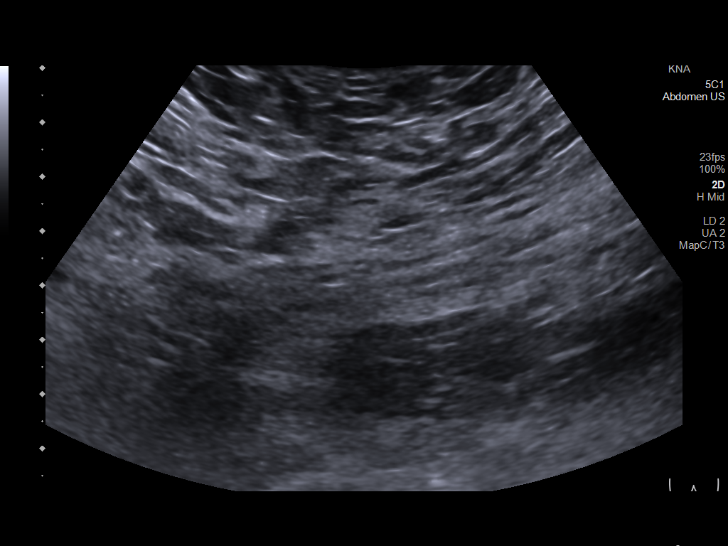
[im 4/11]
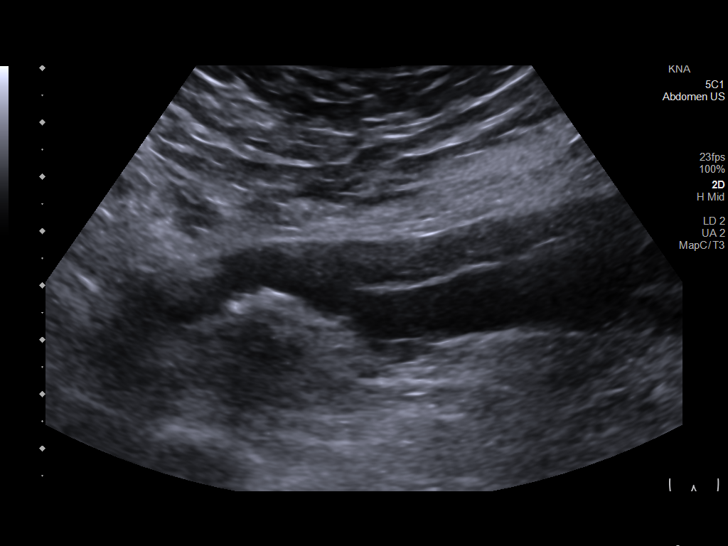
[im 5/11]
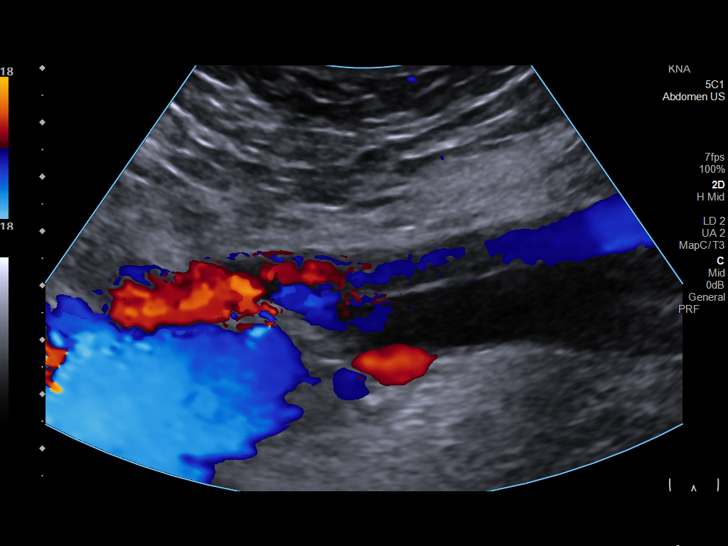
[im 6/11]
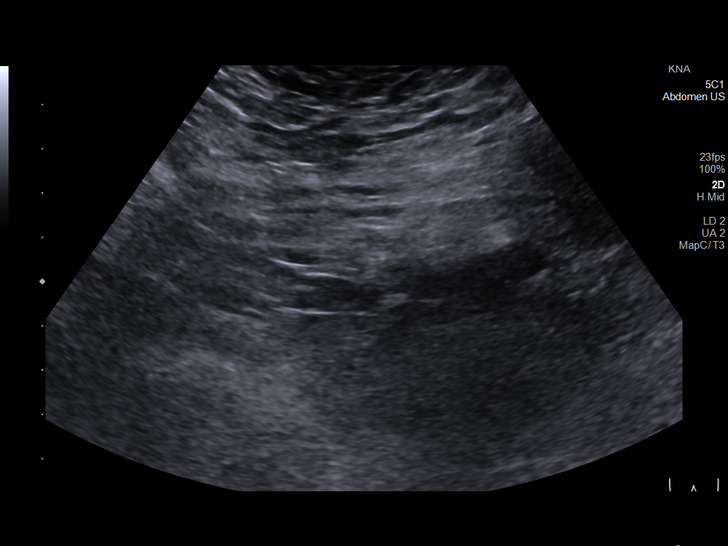
[im 7/11]
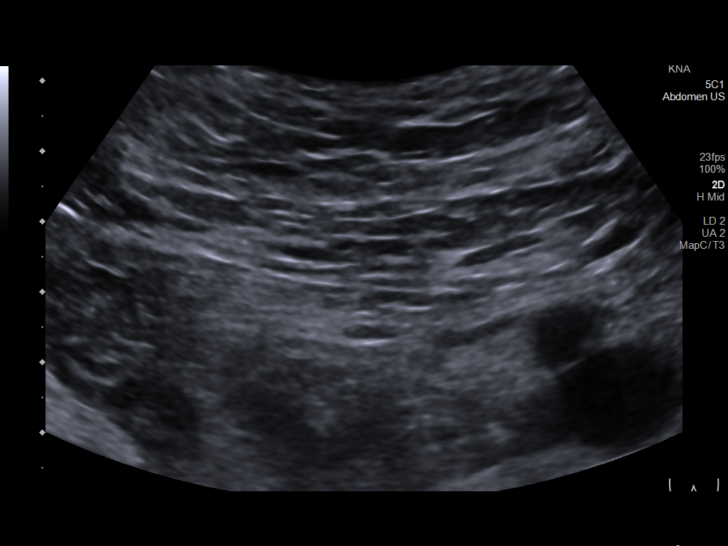
[im 8/11]
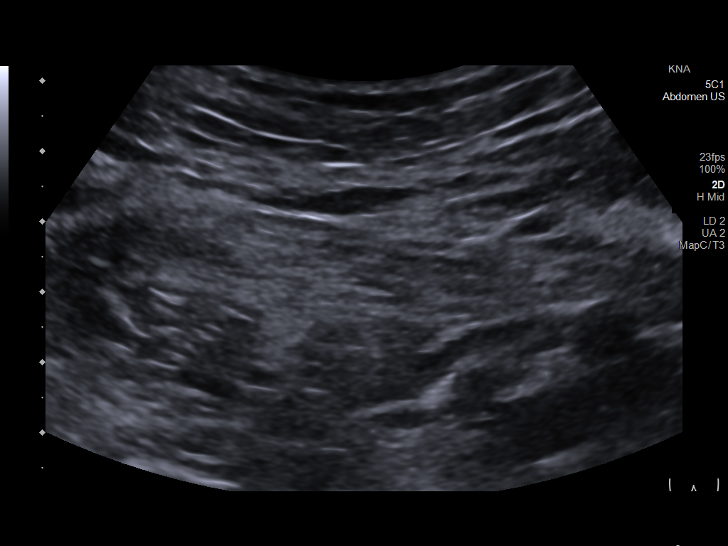
[im 9/11]
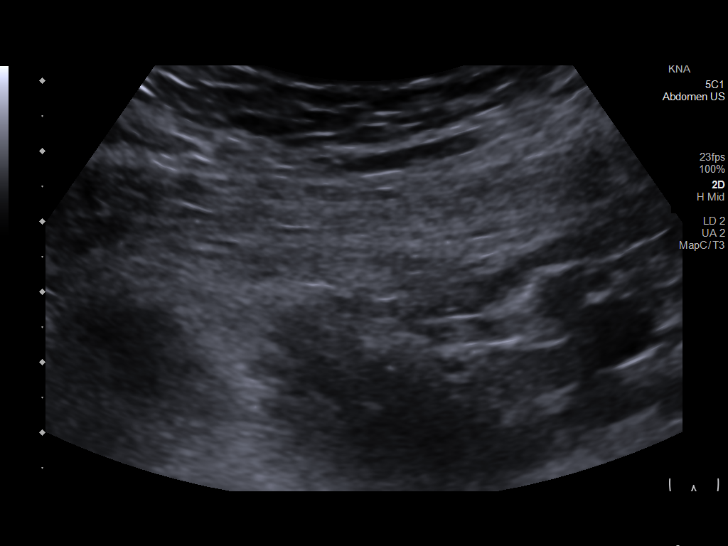
[im 10/11]
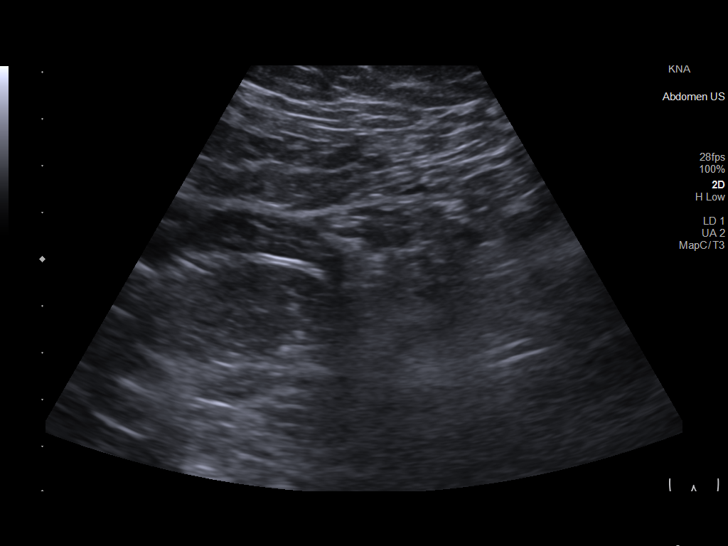
[im 11/11]
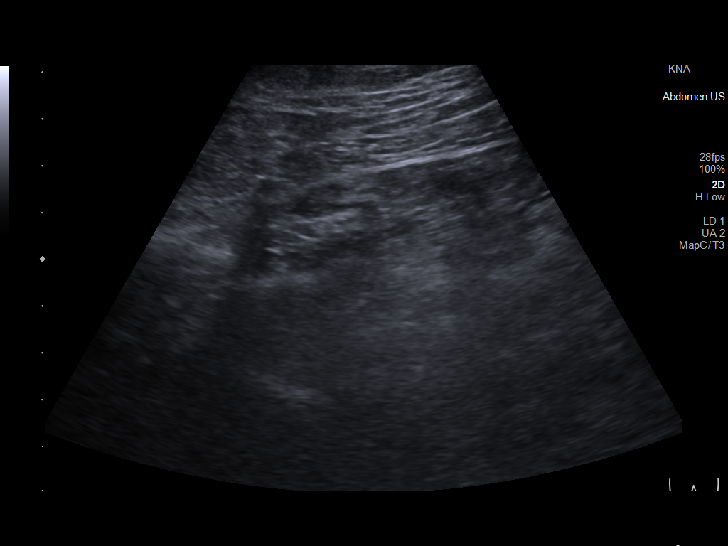

[11 of 11 positions shown; findings below may reference images not displayed]

FINDINGS: No cystic or soft tissue mass visualized. No right groin hernia
visualized, with or without provocative maneuver.
IMPRESSION: No sonographic abnormality to correspond with patient's area of
concern in the right groin.

## 2021-03-02 ENCOUNTER — Encounter: Payer: Self-pay | Admitting: Family Medicine

## 2021-03-07 ENCOUNTER — Other Ambulatory Visit: Payer: Self-pay | Admitting: Family Medicine

## 2021-03-07 DIAGNOSIS — Z794 Long term (current) use of insulin: Secondary | ICD-10-CM

## 2021-03-07 DIAGNOSIS — I10 Essential (primary) hypertension: Secondary | ICD-10-CM

## 2021-03-07 DIAGNOSIS — K219 Gastro-esophageal reflux disease without esophagitis: Secondary | ICD-10-CM

## 2021-03-12 ENCOUNTER — Other Ambulatory Visit: Payer: Self-pay | Admitting: Family

## 2021-03-12 DIAGNOSIS — Z794 Long term (current) use of insulin: Secondary | ICD-10-CM

## 2021-03-12 DIAGNOSIS — E1165 Type 2 diabetes mellitus with hyperglycemia: Secondary | ICD-10-CM

## 2021-03-19 ENCOUNTER — Other Ambulatory Visit: Payer: Self-pay | Admitting: Family Medicine

## 2021-03-19 DIAGNOSIS — Z794 Long term (current) use of insulin: Secondary | ICD-10-CM

## 2021-03-19 DIAGNOSIS — E079 Disorder of thyroid, unspecified: Secondary | ICD-10-CM

## 2021-03-19 DIAGNOSIS — E782 Mixed hyperlipidemia: Secondary | ICD-10-CM

## 2021-03-19 DIAGNOSIS — E1165 Type 2 diabetes mellitus with hyperglycemia: Secondary | ICD-10-CM

## 2021-03-19 DIAGNOSIS — I10 Essential (primary) hypertension: Secondary | ICD-10-CM

## 2021-03-25 ENCOUNTER — Ambulatory Visit (INDEPENDENT_AMBULATORY_CARE_PROVIDER_SITE_OTHER): Payer: No Typology Code available for payment source | Admitting: Family Medicine

## 2021-03-25 ENCOUNTER — Encounter: Payer: Self-pay | Admitting: Family Medicine

## 2021-03-25 ENCOUNTER — Other Ambulatory Visit: Payer: Self-pay

## 2021-03-25 VITALS — BP 109/78 | HR 90 | Temp 97.6°F | Ht 70.0 in | Wt 329.6 lb

## 2021-03-25 DIAGNOSIS — K219 Gastro-esophageal reflux disease without esophagitis: Secondary | ICD-10-CM

## 2021-03-25 DIAGNOSIS — I10 Essential (primary) hypertension: Secondary | ICD-10-CM

## 2021-03-25 DIAGNOSIS — Z23 Encounter for immunization: Secondary | ICD-10-CM | POA: Diagnosis not present

## 2021-03-25 DIAGNOSIS — E1165 Type 2 diabetes mellitus with hyperglycemia: Secondary | ICD-10-CM

## 2021-03-25 DIAGNOSIS — E782 Mixed hyperlipidemia: Secondary | ICD-10-CM

## 2021-03-25 DIAGNOSIS — Z87891 Personal history of nicotine dependence: Secondary | ICD-10-CM

## 2021-03-25 DIAGNOSIS — Z794 Long term (current) use of insulin: Secondary | ICD-10-CM

## 2021-03-25 LAB — BAYER DCA HB A1C WAIVED: HB A1C (BAYER DCA - WAIVED): 7.1 % — ABNORMAL HIGH (ref <7.0)

## 2021-03-25 MED ORDER — OMEPRAZOLE 20 MG PO CPDR: 20.0000 mg | DELAYED_RELEASE_CAPSULE | Freq: Every day | ORAL | 1 refills | Status: DC

## 2021-03-25 MED ORDER — SPIRONOLACTONE 25 MG PO TABS: 25.0000 mg | ORAL_TABLET | Freq: Every day | ORAL | 1 refills | Status: DC

## 2021-03-25 MED ORDER — EMPAGLIFLOZIN 25 MG PO TABS: 25.0000 mg | ORAL_TABLET | Freq: Every day | ORAL | 1 refills | Status: DC

## 2021-03-25 MED ORDER — METFORMIN HCL 500 MG PO TABS: 500.0000 mg | ORAL_TABLET | Freq: Two times a day (BID) | ORAL | 1 refills | Status: DC

## 2021-03-25 NOTE — Progress Notes (Signed)
Assessment & Plan:  1. Type 2 diabetes mellitus with hyperglycemia, with long-term current use of insulin (HCC) Lab Results  Component Value Date   HGBA1C 7.1 (H) 03/25/2021   HGBA1C 6.8 09/25/2020   HGBA1C 6.8 (H) 05/29/2020    - Diabetes is not at goal of A1c < 7. - Medications:  continue Metformin and Trulicity. Changed Ste[place]atro to [place] due to insurance formulary.  - Home glucose monitoring: Encouraged to monitor more frequently - Patient is currently taking a statin. Patient is taking an ACE-inhibitor/ARB.  - Instruction/counseling given: discussed foot care, discussed diet, and provided printed educational material  Diabetes Health Maintenance Due  Topic Date Due   OPHTHALMOLOGY EXAM  05/28/2021   HEMOGLOBIN A1C  09/25/2021   FOOT EXAM  03/25/2022    - Lipid panel - CBC with Differential/Platelet - CMP14+EGFR - Bayer DCA Hb A1c Waived - metFORMIN (GLUCOPHAGE) 500 MG tablet; Take 1 tablet (500 mg total) by mouth 2 (two) times daily with a meal.  Dispense: 180 tablet; Refill: 1 - empagliflozin (JARDIANCE) 25 MG TABS tablet; Take 1 tablet (25 mg total) by mouth daily before breakfast.  Dispense: 90 tablet; Refill: 1  2. Essential hypertension Well controlled on current regimen.  - Lipid panel - CBC with Differential/Platelet - CMP14+EGFR - spironolactone (ALDACTONE) 25 MG tablet; Take 1 tablet (25 mg total) by mouth daily.  Dispense: 90 tablet; Refill: 1  3. Mixed hyperlipidemia Well controlled on current regimen.  - Lipid panel - CMP14+EGFR  4. Morbid obesity ([place]) Diet and exercise encouraged. - Lipid panel - CBC with Differential/Platelet - CMP14+EGFR  5. Gastroesophageal reflux disease, unspecified whether esophagitis present Well controlled on current regimen.  - CMP14+EGFR - omeprazole (PRILOSEC) 20 MG capsule; Take 1 capsule (20 mg total) by mouth daily.  Dispense: 90 capsule; Refill: 1  6. History of tobacco use - CT CHEST LUNG CA SCREEN LOW DOSE  W/O CM; Future   Return in about 3 months (around 06/25/2021) for annual physical.  Hendricks Limes, MSN, APRN, FNP-C [facility]  Subjective:    Patient ID: Daniel Scott, male    DOB: 05-09-1961, 60 y.o.   MRN: [id number]  Patient Care Team: Loman Brooklyn, FNP as PCP - General (Family Medicine)   Chief Complaint:  Chief Complaint  Patient presents with   Diabetes   Hypertension    6 month follow up of chronic medical conditions     HPI: Daniel Scott is a 60 y.o. male presenting on 03/25/2021 for Diabetes and Hypertension (6 month follow up of chronic medical conditions )  Diabetes: Patient presents for follow up of diabetes. Current symptoms include: none. Known diabetic complications: none. Medication compliance: yes. Current diet:  "average" . Current exercise: yard work. His blood sugars at home (when he checks them) are around 180s. Is he  on ACE inhibitor or angiotensin II receptor blocker? Yes. Is he on a statin? Yes.   Patient was a smoker for 33 years during which he averaged 1.5 packs/day. He quit smoking 12 years ago. He is interested in lung cancer screening.     New complaints: None  Social history:  Relevant past medical, surgical, family and social history reviewed and updated as indicated. Interim medical history since our last visit reviewed.  Allergies and medications reviewed and updated.  DATA REVIEWED: CHART IN EPIC  ROS: Negative unless specifically indicated above in HPI.    Current Outpatient Medications:    amLODipine (NORVASC) 10 MG  tablet, TAKE 1 TABLET BY MOUTH EVERY DAY, Disp: 90 tablet, Rfl: 0   aspirin 325 MG EC tablet, Take 325 mg by mouth daily., Disp: , Rfl:    atorvastatin (LIPITOR) 80 MG tablet, TAKE 1 TABLET BY MOUTH EVERY DAY, Disp: 90 tablet, Rfl: 0   Cholecalciferol (DIALYVITE VITAMIN D 5000) 125 MCG (5000 UT) capsule, Take 5,000 Units by mouth daily., Disp: , Rfl:    Cyanocobalamin (B-12) 5000 MCG CAPS,  Take 5,000 mcg by mouth daily., Disp: , Rfl:    Dulaglutide (TRULICITY) 3 OA/4.1YS SOPN, INJECT 0.5 MLS (3 MG TOTAL) AS DIRECTED ONCE A WEEK, Disp: 6.5 mL, Rfl: 0   Garlic 0630 MG CAPS, Take 1,000 mg by mouth 2 (two) times daily., Disp: , Rfl:    Krill Oil 500 MG CAPS, Take 500 mg by mouth daily., Disp: , Rfl:    levothyroxine (SYNTHROID) 75 MCG tablet, TAKE 1 TABLET BY MOUTH EVERY DAY BEFORE BREAKFAST, Disp: 90 tablet, Rfl: 0   lisinopril (ZESTRIL) 20 MG tablet, Take 1 tablet (20 mg total) by mouth daily. (NEEDS TO BE SEEN BEFORE NEXT REFILL), Disp: 30 tablet, Rfl: 0   metFORMIN (GLUCOPHAGE) 500 MG tablet, TAKE 1 TABLET BY MOUTH 2 TIMES DAILY WITH A MEAL., Disp: 180 tablet, Rfl: 0   Multiple Vitamin (MULTIVITAMIN ADULT PO), Take 1 tablet by mouth daily. , Disp: , Rfl:    omeprazole (PRILOSEC) 20 MG capsule, Take 1 capsule (20 mg total) by mouth daily. (NEEDS TO BE SEEN BEFORE NEXT REFILL), Disp: 30 capsule, Rfl: 0   spironolactone (ALDACTONE) 25 MG tablet, TAKE 1 TABLET BY MOUTH EVERY DAY, Disp: 90 tablet, Rfl: 0   STEGLATRO 15 MG TABS tablet, TAKE 1 TABLET BY MOUTH EVERY DAY, Disp: 90 tablet, Rfl: 0   Allergies  Allergen Reactions   Sulfa Antibiotics    Past Medical History:  Diagnosis Date   Arthritis    hands and feet   Depression    Diabetes mellitus without complication (HCC)    type 2   Fatty liver    GERD (gastroesophageal reflux disease)    Hiatal hernia    Hyperlipidemia    Hypertension    Hypothyroidism    Neuromuscular disorder (HCC)    stenosis   Pneumonia 1990   Stenosis of cervical spine    Thyroid disease     Past Surgical History:  Procedure Laterality Date   ANTERIOR CERVICAL DECOMP/DISCECTOMY FUSION N/A 06/02/2020   Procedure: ANTERIOR CERVICAL DECOMPRESSION/DISCECTOMY FUSIONCERVICAL FOUR- CERVICAL FIVE, CERVICAL FIVE- CERVICAL SIX;  Surgeon: Dawley, Theodoro Doing, DO;  Location: [facility];  Service: Neurosurgery;  Laterality: N/A;  ANTERIOR CERVICAL  DECOMPRESSION/DISCECTOMY FUSIONCERVICAL FOUR- CERVICAL FIVE, CERVICAL FIVE- CERVICAL SIX   CARDIAC CATHETERIZATION  1996; 2006   [facility]   HYDROCELE EXCISION     LAPAROSCOPIC GASTRIC BANDING     LAPAROSCOPIC ROUX-EN-Y GASTRIC BYPASS WITH UPPER ENDOSCOPY AND REMOVAL OF LAP BAND  02/2012   TONSILLECTOMY      Social History   Socioeconomic History   Marital status: Married    Spouse name: Not on file   Number of children: Not on file   Years of education: Not on file   Highest education level: Not on file  Occupational History   Not on file  Tobacco Use   Smoking status: Former    Types: Cigarettes    Quit date: 05/2009    Years since quitting: 11.8   Smokeless tobacco: Never  Vaping Use  Vaping Use: Never used  Substance and Sexual Activity   Alcohol use: Yes    Comment: daily   Drug use: Yes    Types: Marijuana    Comment: occ   Sexual activity: Yes    Birth control/protection: None  Other Topics Concern   Not on file  Social History Narrative   Not on file   Social Determinants of Health   Financial Resource Strain: Not on file  Food Insecurity: Not on file  Transportation Needs: Not on file  Physical Activity: Not on file  Stress: Not on file  Social Connections: Not on file  Intimate Partner Violence: Not on file        Objective:    BP 109/78   Pulse 90   Temp 97.6 F (36.4 C) (Temporal)   Ht _0  (1.778 m)   Wt (!) 329 lb 9.6 oz (149.5 kg)   SpO2 94%   BMI 47.29 kg/m   Wt Readings from Last 3 Encounters:  03/25/21 (!) 329 lb 9.6 oz (149.5 kg)  02/18/21 (!) 323 lb 12.8 oz (146.9 kg)  11/03/20 (!) 315 lb (142.9 kg)    Physical Exam Vitals reviewed.  Constitutional:      General: He is not in acute distress.    Appearance: Normal appearance. He is morbidly obese. He is not ill-appearing, toxic-appearing or diaphoretic.  HENT:     Head: Normocephalic and atraumatic.  Eyes:     General: No scleral icterus.        Right eye: No discharge.        Left eye: No discharge.     Conjunctiva/sclera: Conjunctivae normal.  Cardiovascular:     Rate and Rhythm: Normal rate and regular rhythm.     Heart sounds: Normal heart sounds. No murmur heard.   No friction rub. No gallop.  Pulmonary:     Effort: Pulmonary effort is normal. No respiratory distress.     Breath sounds: Normal breath sounds. No stridor. No wheezing, rhonchi or rales.  Musculoskeletal:        General: Normal range of motion.     Cervical back: Normal range of motion.  Skin:    General: Skin is warm and dry.  Neurological:     Mental Status: He is alert and oriented to person, place, and time. Mental status is at baseline.  Psychiatric:        Mood and Affect: Mood normal.        Behavior: Behavior normal.        Thought Content: Thought content normal.        Judgment: Judgment normal.    Lab Results  Component Value Date   TSH 1.380 09/25/2020   Lab Results  Component Value Date   WBC 10.2 11/03/2020   HGB 13.8 11/03/2020   HCT 43.9 11/03/2020   MCV 101.2 (H) 11/03/2020   PLT 241 11/03/2020   Lab Results  Component Value Date   NA 137 11/03/2020   K 4.3 11/03/2020   CO2 23 11/03/2020   GLUCOSE 106 (H) 11/03/2020   BUN 11 11/03/2020   CREATININE 1.19 11/03/2020   BILITOT 0.5 09/25/2020   ALKPHOS 94 09/25/2020   AST 18 09/25/2020   ALT 23 09/25/2020   PROT 6.5 09/25/2020   ALBUMIN 4.3 09/25/2020   CALCIUM 9.5 11/03/2020   ANIONGAP 12 11/03/2020   Lab Results  Component Value Date   CHOL 151 09/25/2020   Lab Results  Component Value Date  HDL 41 09/25/2020   Lab Results  Component Value Date   LDLCALC 84 09/25/2020   Lab Results  Component Value Date   TRIG 146 09/25/2020   Lab Results  Component Value Date   CHOLHDL 3.7 09/25/2020   Lab Results  Component Value Date   HGBA1C 6.8 09/25/2020

## 2021-03-26 LAB — CBC WITH DIFFERENTIAL/PLATELET
Basophils Absolute: 0.1 10*3/uL (ref 0.0–0.2)
Basos: 1 %
EOS (ABSOLUTE): 0.3 10*3/uL (ref 0.0–0.4)
Eos: 4 %
Hematocrit: 45.9 % (ref 37.5–51.0)
Hemoglobin: 15.7 g/dL (ref 13.0–17.7)
Immature Grans (Abs): 0 10*3/uL (ref 0.0–0.1)
Immature Granulocytes: 0 %
Lymphocytes Absolute: 1.8 10*3/uL (ref 0.7–3.1)
Lymphs: 22 %
MCH: 30.7 pg (ref 26.6–33.0)
MCHC: 34.2 g/dL (ref 31.5–35.7)
MCV: 90 fL (ref 79–97)
Monocytes Absolute: 0.8 10*3/uL (ref 0.1–0.9)
Monocytes: 9 %
Neutrophils Absolute: 5.3 10*3/uL (ref 1.4–7.0)
Neutrophils: 64 %
Platelets: 284 10*3/uL (ref 150–450)
RBC: 5.11 x10E6/uL (ref 4.14–5.80)
RDW: 13.2 % (ref 11.6–15.4)
WBC: 8.2 10*3/uL (ref 3.4–10.8)

## 2021-03-26 LAB — CMP14+EGFR
ALT: 24 IU/L (ref 0–44)
AST: 17 IU/L (ref 0–40)
Albumin/Globulin Ratio: 2.7 — ABNORMAL HIGH (ref 1.2–2.2)
Albumin: 4.9 g/dL — ABNORMAL HIGH (ref 3.8–4.8)
Alkaline Phosphatase: 89 IU/L (ref 44–121)
BUN/Creatinine Ratio: 22 (ref 10–24)
BUN: 17 mg/dL (ref 8–27)
Bilirubin Total: 0.2 mg/dL (ref 0.0–1.2)
CO2: 24 mmol/L (ref 20–29)
Calcium: 10 mg/dL (ref 8.6–10.2)
Chloride: 97 mmol/L (ref 96–106)
Creatinine, Ser: 0.79 mg/dL (ref 0.76–1.27)
Globulin, Total: 1.8 g/dL (ref 1.5–4.5)
Glucose: 127 mg/dL — ABNORMAL HIGH (ref 65–99)
Potassium: 5.3 mmol/L — ABNORMAL HIGH (ref 3.5–5.2)
Sodium: 137 mmol/L (ref 134–144)
Total Protein: 6.7 g/dL (ref 6.0–8.5)
eGFR: 101 mL/min/{1.73_m2} (ref >59)

## 2021-03-26 LAB — LIPID PANEL
Chol/HDL Ratio: 3 ratio (ref 0.0–5.0)
Cholesterol, Total: 140 mg/dL (ref 100–199)
HDL: 47 mg/dL (ref >39)
LDL Chol Calc (NIH): 63 mg/dL (ref 0–99)
Triglycerides: 183 mg/dL — ABNORMAL HIGH (ref 0–149)
VLDL Cholesterol Cal: 30 mg/dL (ref 5–40)

## 2021-04-07 ENCOUNTER — Other Ambulatory Visit: Payer: Self-pay | Admitting: Family Medicine

## 2021-04-07 DIAGNOSIS — Z794 Long term (current) use of insulin: Secondary | ICD-10-CM

## 2021-04-07 DIAGNOSIS — E1165 Type 2 diabetes mellitus with hyperglycemia: Secondary | ICD-10-CM

## 2021-04-07 DIAGNOSIS — I10 Essential (primary) hypertension: Secondary | ICD-10-CM

## 2021-04-18 ENCOUNTER — Encounter: Payer: Self-pay | Admitting: Family Medicine

## 2021-04-29 ENCOUNTER — Other Ambulatory Visit: Payer: Self-pay | Admitting: Family Medicine

## 2021-04-29 DIAGNOSIS — E1165 Type 2 diabetes mellitus with hyperglycemia: Secondary | ICD-10-CM

## 2021-04-29 DIAGNOSIS — Z794 Long term (current) use of insulin: Secondary | ICD-10-CM

## 2021-05-02 ENCOUNTER — Other Ambulatory Visit: Payer: Self-pay | Admitting: Family Medicine

## 2021-05-02 DIAGNOSIS — E782 Mixed hyperlipidemia: Secondary | ICD-10-CM

## 2021-05-02 DIAGNOSIS — E1165 Type 2 diabetes mellitus with hyperglycemia: Secondary | ICD-10-CM

## 2021-05-02 DIAGNOSIS — Z794 Long term (current) use of insulin: Secondary | ICD-10-CM

## 2021-05-02 DIAGNOSIS — I10 Essential (primary) hypertension: Secondary | ICD-10-CM

## 2021-05-02 DIAGNOSIS — E079 Disorder of thyroid, unspecified: Secondary | ICD-10-CM

## 2021-05-06 ENCOUNTER — Encounter (HOSPITAL_COMMUNITY): Payer: Self-pay

## 2021-05-06 NOTE — Progress Notes (Signed)
Received referral for initial lung cancer screening scan. I contacted the patient who reports that he quit smoking 12 years ago and does not want to proceed with LCS at this time, he will discuss it further with his PCP at their next visit. Referral closed at this time. Referring provider aware.

## 2021-05-28 LAB — HM DIABETES EYE EXAM

## 2021-06-02 ENCOUNTER — Encounter: Payer: Self-pay | Admitting: Family Medicine

## 2021-06-02 ENCOUNTER — Other Ambulatory Visit: Payer: Self-pay | Admitting: Family Medicine

## 2021-06-02 DIAGNOSIS — E1165 Type 2 diabetes mellitus with hyperglycemia: Secondary | ICD-10-CM

## 2021-06-02 DIAGNOSIS — Z794 Long term (current) use of insulin: Secondary | ICD-10-CM

## 2021-06-15 ENCOUNTER — Encounter: Payer: Self-pay | Admitting: Family Medicine

## 2021-06-21 ENCOUNTER — Other Ambulatory Visit (HOSPITAL_COMMUNITY): Payer: Self-pay

## 2021-06-21 MED ORDER — INFLUENZA VAC SPLIT QUAD 0.5 ML IM SUSY
PREFILLED_SYRINGE | INTRAMUSCULAR | 0 refills | Status: DC
  Filled 2021-06-21: qty 0.5, 1d supply, fill #0

## 2021-06-30 ENCOUNTER — Encounter: Payer: No Typology Code available for payment source | Admitting: Family Medicine

## 2021-07-01 ENCOUNTER — Other Ambulatory Visit: Payer: Self-pay | Admitting: Family Medicine

## 2021-07-01 DIAGNOSIS — Z794 Long term (current) use of insulin: Secondary | ICD-10-CM

## 2021-07-24 ENCOUNTER — Encounter: Payer: Self-pay | Admitting: Family Medicine

## 2021-07-30 ENCOUNTER — Other Ambulatory Visit: Payer: Self-pay | Admitting: Family Medicine

## 2021-07-30 DIAGNOSIS — E1165 Type 2 diabetes mellitus with hyperglycemia: Secondary | ICD-10-CM

## 2021-07-30 DIAGNOSIS — Z794 Long term (current) use of insulin: Secondary | ICD-10-CM

## 2021-08-03 ENCOUNTER — Ambulatory Visit (INDEPENDENT_AMBULATORY_CARE_PROVIDER_SITE_OTHER): Payer: No Typology Code available for payment source | Admitting: Family Medicine

## 2021-08-03 ENCOUNTER — Encounter: Payer: Self-pay | Admitting: Family Medicine

## 2021-08-03 VITALS — BP 124/86 | HR 81 | Temp 97.8°F | Ht 70.0 in | Wt 335.0 lb

## 2021-08-03 DIAGNOSIS — E782 Mixed hyperlipidemia: Secondary | ICD-10-CM | POA: Diagnosis not present

## 2021-08-03 DIAGNOSIS — K219 Gastro-esophageal reflux disease without esophagitis: Secondary | ICD-10-CM

## 2021-08-03 DIAGNOSIS — I10 Essential (primary) hypertension: Secondary | ICD-10-CM

## 2021-08-03 DIAGNOSIS — E1165 Type 2 diabetes mellitus with hyperglycemia: Secondary | ICD-10-CM

## 2021-08-03 DIAGNOSIS — Z0001 Encounter for general adult medical examination with abnormal findings: Secondary | ICD-10-CM | POA: Diagnosis not present

## 2021-08-03 DIAGNOSIS — I700 Atherosclerosis of aorta: Secondary | ICD-10-CM

## 2021-08-03 DIAGNOSIS — Z0000 Encounter for general adult medical examination without abnormal findings: Secondary | ICD-10-CM

## 2021-08-03 DIAGNOSIS — E039 Hypothyroidism, unspecified: Secondary | ICD-10-CM | POA: Insufficient documentation

## 2021-08-03 LAB — BAYER DCA HB A1C WAIVED: HB A1C (BAYER DCA - WAIVED): 6.8 % — ABNORMAL HIGH (ref 4.8–5.6)

## 2021-08-03 MED ORDER — ATORVASTATIN CALCIUM 80 MG PO TABS: 80.0000 mg | ORAL_TABLET | Freq: Every day | ORAL | 1 refills | Status: DC

## 2021-08-03 MED ORDER — LISINOPRIL 20 MG PO TABS: 20.0000 mg | ORAL_TABLET | Freq: Every day | ORAL | 1 refills | Status: DC

## 2021-08-03 MED ORDER — TRULICITY 3 MG/0.5ML ~~LOC~~ SOAJ: 3.0000 mg | SUBCUTANEOUS | 4 refills | Status: DC

## 2021-08-03 MED ORDER — AMLODIPINE BESYLATE 10 MG PO TABS: 10.0000 mg | ORAL_TABLET | Freq: Every day | ORAL | 1 refills | Status: DC

## 2021-08-03 NOTE — Progress Notes (Signed)
 Assessment & Plan:  1. Well adult exam Preventive health education provided. - Lipid panel - CMP14+EGFR - CBC with Differential/Platelet  2. Type 2 diabetes mellitus with hyperglycemia, without long-term current use of insulin (HCC) A1c 6.8 today, improved from 7.1. - Diabetes is at goal of A1c < 7. - Medications: continue current medications - Patient is currently taking a statin. Patient is taking an ACE-inhibitor/ARB.  - Instruction/counseling given: discussed the need for weight loss and discussed diet  Diabetes Health Maintenance Due  Topic Date Due   OPHTHALMOLOGY EXAM  05/28/2021   HEMOGLOBIN A1C  09/25/2021   FOOT EXAM  03/25/2022    Lab Results  Component Value Date   LABMICR See below: 08/25/2020   LABMICR <3.0 03/10/2020   - Lipid panel - CMP14+EGFR - CBC with Differential/Platelet - Bayer DCA Hb A1c Waived - Vitamin B12 level added on to lab work. - atorvastatin (LIPITOR) 80 MG tablet; Take 1 tablet (80 mg total) by mouth daily.  Dispense: 90 tablet; Refill: 1 - Dulaglutide (TRULICITY) 3 MG/0.5ML SOPN; Inject 3 mg as directed once a week.  Dispense: 2 mL; Refill: 4 - lisinopril (ZESTRIL) 20 MG tablet; Take 1 tablet (20 mg total) by mouth daily.  Dispense: 90 tablet; Refill: 1  3. Essential hypertension Well controlled on current regimen.  - Lipid panel - CMP14+EGFR - CBC with Differential/Platelet - amLODipine (NORVASC) 10 MG tablet; Take 1 tablet (10 mg total) by mouth daily.  Dispense: 90 tablet; Refill: 1 - lisinopril (ZESTRIL) 20 MG tablet; Take 1 tablet (20 mg total) by mouth daily.  Dispense: 90 tablet; Refill: 1  4. Mixed hyperlipidemia Well controlled on current regimen.  - Lipid panel - CMP14+EGFR - atorvastatin (LIPITOR) 80 MG tablet; Take 1 tablet (80 mg total) by mouth daily.  Dispense: 90 tablet; Refill: 1  5. Aortic atherosclerosis (HCC) Continue statin and aspirin. - Lipid panel - CMP14+EGFR  6. Morbid obesity (HCC) - Dulaglutide  (TRULICITY) 3 MG/0.5ML SOPN; Inject 3 mg as directed once a week.  Dispense: 2 mL; Refill: 4  7. Gastroesophageal reflux disease, unspecified whether esophagitis present Well controlled on current regimen.  - CMP14+EGFR  8. Acquired hypothyroidism Well controlled on current regimen. TSH and free T4 added on to lab work.   Follow-up: Return in about 1 month (around 09/02/2021) for follow-up of chronic medication conditions.   Britney Joyce, MSN, APRN, FNP-C [facility]  Subjective:  Patient ID: Mack L Paye, male    DOB: 07/04/1961  Age: 60 y.o. MRN: [id number]  Patient Care Team: Joyce, Britney F, FNP as PCP - General (Family Medicine) Martin, Dustin, OD (Optometry)   CC:  Chief Complaint  Patient presents with   Annual Exam    HPI Matthias L Redfield presents for his annual physical.   Work: retired as of a month ago, Marital status: married, Substance use: marijuana Diet: regular Exercise: none Last eye visit: September 2022 Last dental visit: spring time 2022 Last colonoscopy: 07/06/2017 with repeat in 5 years (2023) Lung cancer screening CT: quit smoking >15 years ago AAA u/s: not eligible until 62 years of age Hepatitis C Screening: 03/10/2020 Immunizations: Flu Vaccine: up to date Tdap Vaccine: up to date  Shingrix Vaccine:  patient reports he is UTD, we do not have this record   COVID-19 Vaccine: up to date Pneumonia Vaccine: up to date  DEPRESSION SCREENING PHQ 2/9 Scores 08/03/2021 03/25/2021 02/18/2021 09/25/2020 08/25/2020 06/11/2020 03/10/2020  PHQ - 2 Score 0 1 0   0 0 0 0  PHQ- 9 Score 2 3 0 1 - - 0     Diabetes: Patient presents for follow up of diabetes. Known diabetic complications: none. Medication compliance: yes. He does not monitor blood sugars at home. Regular diet. No exercise. Is he  on ACE inhibitor or angiotensin II receptor blocker? Yes (Lisinopril). Is he on a statin? Yes (Atorvastatin).    Review of Systems  Constitutional:   Negative for chills, fever, malaise/fatigue and weight loss.  HENT:  Negative for congestion, ear discharge, ear pain, nosebleeds, sinus pain, sore throat and tinnitus.   Eyes:  Negative for blurred vision, double vision, pain, discharge and redness.  Respiratory:  Negative for cough, shortness of breath and wheezing.   Cardiovascular:  Negative for chest pain, palpitations and leg swelling.  Gastrointestinal:  Negative for abdominal pain, constipation, diarrhea, heartburn, nausea and vomiting.  Genitourinary:  Negative for dysuria, frequency and urgency.  Musculoskeletal:  Negative for myalgias.  Skin:  Negative for rash.  Neurological:  Negative for dizziness, seizures, weakness and headaches.  Psychiatric/Behavioral:  Negative for depression, substance abuse and suicidal ideas. The patient is not nervous/anxious.     Current Outpatient Medications:    amLODipine (NORVASC) 10 MG tablet, TAKE 1 TABLET BY MOUTH EVERY DAY, Disp: 90 tablet, Rfl: 0   Apple Cider Vinegar 500 MG TABS, , Disp: , Rfl:    aspirin 325 MG EC tablet, Take 325 mg by mouth daily., Disp: , Rfl:    atorvastatin (LIPITOR) 80 MG tablet, TAKE 1 TABLET BY MOUTH EVERY DAY, Disp: 90 tablet, Rfl: 0   Cholecalciferol (DIALYVITE VITAMIN D 5000) 125 MCG (5000 UT) capsule, Take 5,000 Units by mouth daily., Disp: , Rfl:    CINNAMON PO, , Disp: , Rfl:    Cyanocobalamin (B-12) 5000 MCG CAPS, Take 5,000 mcg by mouth daily., Disp: , Rfl:    empagliflozin (JARDIANCE) 25 MG TABS tablet, Take 1 tablet (25 mg total) by mouth daily before breakfast., Disp: 90 tablet, Rfl: 1   Garlic 6568 MG CAPS, Take 1,000 mg by mouth 2 (two) times daily., Disp: , Rfl:    influenza vac split quadrivalent PF (FLUARIX) 0.5 ML injection, Inject into the muscle., Disp: 0.5 mL, Rfl: 0   Krill Oil 500 MG CAPS, Take 500 mg by mouth daily., Disp: , Rfl:    levothyroxine (SYNTHROID) 75 MCG tablet, TAKE 1 TABLET BY MOUTH EVERY DAY BEFORE BREAKFAST, Disp: 90 tablet,  Rfl: 1   lisinopril (ZESTRIL) 20 MG tablet, TAKE 1 TABLET BY MOUTH EVERY DAY, Disp: 90 tablet, Rfl: 0   metFORMIN (GLUCOPHAGE) 500 MG tablet, Take 1 tablet (500 mg total) by mouth 2 (two) times daily with a meal., Disp: 180 tablet, Rfl: 1   Multiple Vitamin (MULTIVITAMIN ADULT PO), Take 1 tablet by mouth daily. , Disp: , Rfl:    omeprazole (PRILOSEC) 20 MG capsule, Take 1 capsule (20 mg total) by mouth daily., Disp: 90 capsule, Rfl: 1   PSYLLIUM HUSK PO, , Disp: , Rfl:    spironolactone (ALDACTONE) 25 MG tablet, Take 1 tablet (25 mg total) by mouth daily., Disp: 90 tablet, Rfl: 1   TRULICITY 3 LE/7.5TZ SOPN, INJECT 3 MG AS DIRECTED ONCE A WEEK. (NEEDS TO BE SEEN BEFORE NEXT REFILL), Disp: 2 mL, Rfl: 0  Allergies  Allergen Reactions   Sulfa Antibiotics     Past Medical History:  Diagnosis Date   Arthritis    hands and feet   Depression  Diabetes mellitus without complication (HCC)    type 2   Fatty liver    GERD (gastroesophageal reflux disease)    Hiatal hernia    Hyperlipidemia    Hypertension    Hypothyroidism    Neuromuscular disorder (HCC)    stenosis   Pneumonia 1990   Stenosis of cervical spine    Thyroid disease     Past Surgical History:  Procedure Laterality Date   ANTERIOR CERVICAL DECOMP/DISCECTOMY FUSION N/A 06/02/2020   Procedure: ANTERIOR CERVICAL DECOMPRESSION/DISCECTOMY FUSIONCERVICAL FOUR- CERVICAL FIVE, CERVICAL FIVE- CERVICAL SIX;  Surgeon: Dawley, Troy C, DO;  Location: [facility] OR;  Service: Neurosurgery;  Laterality: N/A;  ANTERIOR CERVICAL DECOMPRESSION/DISCECTOMY FUSIONCERVICAL FOUR- CERVICAL FIVE, CERVICAL FIVE- CERVICAL SIX   CARDIAC CATHETERIZATION  1996; 2006   [facility]   HYDROCELE EXCISION     LAPAROSCOPIC GASTRIC BANDING     LAPAROSCOPIC ROUX-EN-Y GASTRIC BYPASS WITH UPPER ENDOSCOPY AND REMOVAL OF LAP BAND  02/2012   TONSILLECTOMY      Family History  Problem Relation Age of Onset   Vascular Disease Mother    Cancer Mother         Bone marrow   Kidney disease Mother    Thyroid disease Mother    Hypertension Mother    Diabetes Mother    Coronary artery disease Father    Colon cancer Father    Heart disease Father    Hyperlipidemia Father    Hypertension Father    Spina bifida Daughter    Depression Son    Anxiety disorder Son    Hypertension Son    Breast cancer Maternal Grandmother    Hypertension Maternal Grandmother    Stroke Maternal Grandmother    Lung cancer Maternal Grandfather    Diabetes Maternal Grandfather    Heart disease Maternal Grandfather    Hypertension Maternal Grandfather    Stroke Maternal Grandfather    Hypertension Paternal Grandmother    Heart disease Paternal Grandfather    Hypertension Paternal Grandfather    Autism Son    Bipolar disorder Son    Autism Son     Social History   Socioeconomic History   Marital status: Married    Spouse name: Not on file   Number of children: Not on file   Years of education: Not on file   Highest education level: Not on file  Occupational History   Not on file  Tobacco Use   Smoking status: Former    Types: Cigarettes    Quit date: 05/2009    Years since quitting: 12.2   Smokeless tobacco: Never  Vaping Use   Vaping Use: Never used  Substance and Sexual Activity   Alcohol use: Yes    Comment: daily   Drug use: Yes    Types: Marijuana    Comment: occ   Sexual activity: Yes    Birth control/protection: None  Other Topics Concern   Not on file  Social History Narrative   Not on file   Social Determinants of Health   Financial Resource Strain: Not on file  Food Insecurity: Not on file  Transportation Needs: Not on file  Physical Activity: Not on file  Stress: Not on file  Social Connections: Not on file  Intimate Partner Violence: Not on file      Objective:    BP 124/86   Pulse 81   Temp 97.8 F (36.6 C) (Temporal)   Ht 5' 10" (1.778 m)   Wt (!) 335 lb (  152 kg)   SpO2 94%   BMI 48.07 kg/m   Wt  Readings from Last 3 Encounters:  08/03/21 (!) 335 lb (152 kg)  03/25/21 (!) 329 lb 9.6 oz (149.5 kg)  02/18/21 (!) 323 lb 12.8 oz (146.9 kg)    Physical Exam Vitals reviewed.  Constitutional:      General: He is not in acute distress.    Appearance: Normal appearance. He is morbidly obese. He is not ill-appearing, toxic-appearing or diaphoretic.  HENT:     Head: Normocephalic and atraumatic.     Right Ear: Tympanic membrane, ear canal and external ear normal. There is no impacted cerumen.     Left Ear: Tympanic membrane, ear canal and external ear normal. There is no impacted cerumen.     Nose: Nose normal. No congestion or rhinorrhea.     Mouth/Throat:     Mouth: Mucous membranes are moist.     Pharynx: Oropharynx is clear. No oropharyngeal exudate or posterior oropharyngeal erythema.  Eyes:     General: No scleral icterus.       Right eye: No discharge.        Left eye: No discharge.     Conjunctiva/sclera: Conjunctivae normal.     Pupils: Pupils are equal, round, and reactive to light.  Neck:     Comments: Wearing neck brace s/p surgery. Cardiovascular:     Rate and Rhythm: Normal rate and regular rhythm.     Heart sounds: Normal heart sounds. No murmur heard.   No friction rub. No gallop.  Pulmonary:     Effort: Pulmonary effort is normal. No respiratory distress.     Breath sounds: Normal breath sounds. No stridor. No wheezing, rhonchi or rales.  Abdominal:     General: Abdomen is flat. Bowel sounds are normal. There is no distension.     Palpations: Abdomen is soft. There is no hepatomegaly, splenomegaly or mass.     Tenderness: There is no abdominal tenderness. There is no guarding or rebound.     Hernia: No hernia is present.  Musculoskeletal:        General: Normal range of motion.     Cervical back: Neck supple. No rigidity. No muscular tenderness.     Right lower leg: No edema.     Left lower leg: No edema.  Lymphadenopathy:     Cervical: No cervical  adenopathy.  Skin:    General: Skin is warm and dry.     Capillary Refill: Capillary refill takes less than 2 seconds.  Neurological:     General: No focal deficit present.     Mental Status: He is alert and oriented to person, place, and time. Mental status is at baseline.  Psychiatric:        Mood and Affect: Mood normal.        Behavior: Behavior normal.        Thought Content: Thought content normal.        Judgment: Judgment normal.    Lab Results  Component Value Date   TSH 1.380 09/25/2020   Lab Results  Component Value Date   WBC 8.2 03/25/2021   HGB 15.7 03/25/2021   HCT 45.9 03/25/2021   MCV 90 03/25/2021   PLT 284 03/25/2021   Lab Results  Component Value Date   NA 137 03/25/2021   K 5.3 (H) 03/25/2021   CO2 24 03/25/2021   GLUCOSE 127 (H) 03/25/2021   BUN 17 03/25/2021   CREATININE 0.79 03/25/2021     BILITOT 0.2 03/25/2021   ALKPHOS 89 03/25/2021   AST 17 03/25/2021   ALT 24 03/25/2021   PROT 6.7 03/25/2021   ALBUMIN 4.9 (H) 03/25/2021   CALCIUM 10.0 03/25/2021   ANIONGAP 12 11/03/2020   EGFR 101 03/25/2021   Lab Results  Component Value Date   CHOL 140 03/25/2021   Lab Results  Component Value Date   HDL 47 03/25/2021   Lab Results  Component Value Date   LDLCALC 63 03/25/2021   Lab Results  Component Value Date   TRIG 183 (H) 03/25/2021   Lab Results  Component Value Date   CHOLHDL 3.0 03/25/2021   Lab Results  Component Value Date   HGBA1C 7.1 (H) 03/25/2021

## 2021-08-04 LAB — CBC WITH DIFFERENTIAL/PLATELET
Basophils Absolute: 0.1 10*3/uL (ref 0.0–0.2)
Basos: 1 %
EOS (ABSOLUTE): 0.2 10*3/uL (ref 0.0–0.4)
Eos: 3 %
Hematocrit: 43.6 % (ref 37.5–51.0)
Hemoglobin: 15 g/dL (ref 13.0–17.7)
Immature Grans (Abs): 0 10*3/uL (ref 0.0–0.1)
Immature Granulocytes: 0 %
Lymphocytes Absolute: 1.4 10*3/uL (ref 0.7–3.1)
Lymphs: 21 %
MCH: 32.2 pg (ref 26.6–33.0)
MCHC: 34.4 g/dL (ref 31.5–35.7)
MCV: 94 fL (ref 79–97)
Monocytes Absolute: 0.7 10*3/uL (ref 0.1–0.9)
Monocytes: 10 %
Neutrophils Absolute: 4.4 10*3/uL (ref 1.4–7.0)
Neutrophils: 65 %
Platelets: 241 10*3/uL (ref 150–450)
RBC: 4.66 x10E6/uL (ref 4.14–5.80)
RDW: 12.9 % (ref 11.6–15.4)
WBC: 6.8 10*3/uL (ref 3.4–10.8)

## 2021-08-04 LAB — LIPID PANEL
Chol/HDL Ratio: 4.2 ratio (ref 0.0–5.0)
Cholesterol, Total: 180 mg/dL (ref 100–199)
HDL: 43 mg/dL (ref >39)
LDL Chol Calc (NIH): 96 mg/dL (ref 0–99)
Triglycerides: 241 mg/dL — ABNORMAL HIGH (ref 0–149)
VLDL Cholesterol Cal: 41 mg/dL — ABNORMAL HIGH (ref 5–40)

## 2021-08-04 LAB — CMP14+EGFR
ALT: 32 IU/L (ref 0–44)
AST: 25 IU/L (ref 0–40)
Albumin/Globulin Ratio: 2.4 — ABNORMAL HIGH (ref 1.2–2.2)
Albumin: 4.4 g/dL (ref 3.8–4.8)
Alkaline Phosphatase: 85 IU/L (ref 44–121)
BUN/Creatinine Ratio: 18 (ref 10–24)
BUN: 16 mg/dL (ref 8–27)
Bilirubin Total: 0.2 mg/dL (ref 0.0–1.2)
CO2: 24 mmol/L (ref 20–29)
Calcium: 9.3 mg/dL (ref 8.6–10.2)
Chloride: 101 mmol/L (ref 96–106)
Creatinine, Ser: 0.88 mg/dL (ref 0.76–1.27)
Globulin, Total: 1.8 g/dL (ref 1.5–4.5)
Glucose: 150 mg/dL — ABNORMAL HIGH (ref 70–99)
Potassium: 4.8 mmol/L (ref 3.5–5.2)
Sodium: 137 mmol/L (ref 134–144)
Total Protein: 6.2 g/dL (ref 6.0–8.5)
eGFR: 97 mL/min/{1.73_m2} (ref >59)

## 2021-08-13 ENCOUNTER — Encounter: Payer: Self-pay | Admitting: Family Medicine

## 2021-08-24 ENCOUNTER — Other Ambulatory Visit: Payer: Self-pay | Admitting: Family Medicine

## 2021-08-24 DIAGNOSIS — Z794 Long term (current) use of insulin: Secondary | ICD-10-CM

## 2021-08-24 DIAGNOSIS — I10 Essential (primary) hypertension: Secondary | ICD-10-CM

## 2021-09-10 ENCOUNTER — Other Ambulatory Visit: Payer: Self-pay | Admitting: Family Medicine

## 2021-09-10 ENCOUNTER — Encounter: Payer: Self-pay | Admitting: Family Medicine

## 2021-09-10 DIAGNOSIS — E1165 Type 2 diabetes mellitus with hyperglycemia: Secondary | ICD-10-CM

## 2021-09-10 DIAGNOSIS — I10 Essential (primary) hypertension: Secondary | ICD-10-CM

## 2021-09-10 MED ORDER — LISINOPRIL 20 MG PO TABS: 20.0000 mg | ORAL_TABLET | Freq: Every day | ORAL | 0 refills | Status: DC

## 2021-09-26 ENCOUNTER — Other Ambulatory Visit: Payer: Self-pay | Admitting: Family Medicine

## 2021-09-26 DIAGNOSIS — E1165 Type 2 diabetes mellitus with hyperglycemia: Secondary | ICD-10-CM

## 2021-09-27 NOTE — Telephone Encounter (Signed)
New ins is Friday health plan  Updated in Epic  Key: [id number] - PA Case ID: [id number] Need help? Call us at [phone] Status Sent to Plan today Drug Trulicity 3MG /0.5ML Rx Electronic Prior Authorization Form

## 2021-09-28 MED ORDER — TRULICITY 3 MG/0.5ML ~~LOC~~ SOAJ: 3.0000 mg | SUBCUTANEOUS | 4 refills | Status: DC

## 2021-09-28 NOTE — Telephone Encounter (Signed)
Approved today PA Case: [id number], Status: Approved, Coverage Starts on: 09/27/2021 12:00 AM, Coverage Ends on: 09/27/2022 12:00 AM. Questions? Contact [phone]  [facility] called

## 2021-09-28 NOTE — Telephone Encounter (Signed)
Records requested from plan - these were faxes this morning 09/28/2021.

## 2021-10-05 ENCOUNTER — Encounter: Payer: Self-pay | Admitting: Family Medicine

## 2021-10-05 DIAGNOSIS — E1165 Type 2 diabetes mellitus with hyperglycemia: Secondary | ICD-10-CM

## 2021-10-08 ENCOUNTER — Other Ambulatory Visit: Payer: Self-pay | Admitting: Family Medicine

## 2021-10-08 DIAGNOSIS — E1165 Type 2 diabetes mellitus with hyperglycemia: Secondary | ICD-10-CM

## 2021-10-10 NOTE — Telephone Encounter (Signed)
Do we have a local pharmacy that has it available? Does [facility] have the 1.5 mg pens?

## 2021-10-11 NOTE — Telephone Encounter (Signed)
Can we please check on the 1.5 mg pens?

## 2021-10-14 ENCOUNTER — Telehealth: Payer: Self-pay | Admitting: Family Medicine

## 2021-10-14 DIAGNOSIS — E1165 Type 2 diabetes mellitus with hyperglycemia: Secondary | ICD-10-CM

## 2021-10-14 MED ORDER — SEMAGLUTIDE (1 MG/DOSE) 4 MG/3ML ~~LOC~~ SOPN: 1.0000 mg | PEN_INJECTOR | SUBCUTANEOUS | 2 refills | Status: DC

## 2021-10-18 NOTE — Telephone Encounter (Signed)
I see [employer]. Maybe it was recently updated. Can we please try again?

## 2021-10-18 NOTE — Telephone Encounter (Signed)
I tried to start PA but patient has no insurance per chart.

## 2021-10-18 NOTE — Telephone Encounter (Signed)
Message from Plan PA Case: [id number], Status: Approved, Coverage Starts on: 10/18/2021 12:00 AM, Coverage Ends on: 10/18/2022 12:00 AM. Questions? Contact [phone].  Patient and pharmacy aware.

## 2021-10-18 NOTE — Telephone Encounter (Signed)
(  Key: [id number]) Ozempic (1 MG/DOSE) 4MG /3ML pen-injectors  PA in process

## 2021-10-20 ENCOUNTER — Encounter: Payer: Self-pay | Admitting: Family Medicine

## 2021-10-20 ENCOUNTER — Other Ambulatory Visit: Payer: Self-pay | Admitting: Family Medicine

## 2021-10-20 ENCOUNTER — Ambulatory Visit (INDEPENDENT_AMBULATORY_CARE_PROVIDER_SITE_OTHER): Payer: 59 | Admitting: Family Medicine

## 2021-10-20 VITALS — BP 124/72 | HR 88 | Temp 98.0°F | Ht 70.0 in | Wt 337.4 lb

## 2021-10-20 DIAGNOSIS — G8929 Other chronic pain: Secondary | ICD-10-CM

## 2021-10-20 DIAGNOSIS — Z794 Long term (current) use of insulin: Secondary | ICD-10-CM

## 2021-10-20 DIAGNOSIS — M5442 Lumbago with sciatica, left side: Secondary | ICD-10-CM

## 2021-10-20 DIAGNOSIS — M5441 Lumbago with sciatica, right side: Secondary | ICD-10-CM | POA: Diagnosis not present

## 2021-10-20 DIAGNOSIS — E1165 Type 2 diabetes mellitus with hyperglycemia: Secondary | ICD-10-CM

## 2021-10-20 NOTE — Patient Instructions (Signed)
Please call each morning you are available to see if we have x-ray available that day.

## 2021-10-20 NOTE — Progress Notes (Signed)
Assessment & Plan:  1. Chronic midline low back pain with bilateral sciatica Education provided on chronic back pain. Patient will return for his x-ray as we do not have an x-ray technician today. He does not wish to complete his x-ray at the hospital. He has failed conservative management of back pain. Continue Ibuprofen as needed.  - DG Lumbar Spine 2-3 Views; Future   Follow up plan: Return if symptoms worsen or fail to improve.  [name], MSN, APRN, FNP-C [facility]  Subjective:   Patient ID: Daniel Scott, male    DOB: Mar 06, 1961, 61 y.o.   MRN: [id number]  HPI: Daniel Scott is a 61 y.o. male presenting on 10/20/2021 for Back Pain (Lower back, started back in June. Feels better for a little then it started again about 2 weeks ago. Pt stated that he  was possible dx with a Hernia disc.)  Back Pain: Patient presents for presents evaluation of low back problems.  Symptoms have been present for 2 weeks and include pain in the lumbar spine that radiates around to the hips, groin, and thighs. Exacerbating factors identifiable by patient are standing and sleeping on the left side .  Previous lower back problems:  patient was seen at urgent care in June 2022 with similar symptoms. He was given Toradol and DepoMedrol IM in the clinic and send home with prescriptions for Ibuprofen and Tizanidine . He was then seen again eight days ago at which time he was given Toradol IM and prescribed prednisone and Tizanidine. He has also been taking Ibuprofen at home as needed. Previous workup:  patient reports imaging 10+ years ago that showed a herniated disc, however he is unsure which vertebrae this was located at .    ROS: Negative unless specifically indicated above in HPI.   Relevant past medical history reviewed and updated as indicated.   Allergies and medications reviewed and updated.   Current Outpatient Medications:    amLODipine (NORVASC) 10 MG tablet, Take 1  tablet (10 mg total) by mouth daily., Disp: 90 tablet, Rfl: 1   Apple Cider Vinegar 500 MG TABS, , Disp: , Rfl:    aspirin 325 MG EC tablet, Take 325 mg by mouth daily., Disp: , Rfl:    atorvastatin (LIPITOR) 80 MG tablet, Take 1 tablet (80 mg total) by mouth daily., Disp: 90 tablet, Rfl: 1   Cholecalciferol (DIALYVITE VITAMIN D 5000) 125 MCG (5000 UT) capsule, Take 5,000 Units by mouth daily., Disp: , Rfl:    CINNAMON PO, , Disp: , Rfl:    Cyanocobalamin (B-12) 5000 MCG CAPS, Take 5,000 mcg by mouth daily., Disp: , Rfl:    empagliflozin (JARDIANCE) 25 MG TABS tablet, Take 1 tablet (25 mg total) by mouth daily before breakfast., Disp: 90 tablet, Rfl: 1   Garlic 1000 MG CAPS, Take 1,000 mg by mouth 2 (two) times daily., Disp: , Rfl:    Krill Oil 500 MG CAPS, Take 500 mg by mouth daily., Disp: , Rfl:    levothyroxine (SYNTHROID) 75 MCG tablet, TAKE 1 TABLET BY MOUTH EVERY DAY BEFORE BREAKFAST, Disp: 90 tablet, Rfl: 1   lisinopril (ZESTRIL) 20 MG tablet, Take 1 tablet (20 mg total) by mouth daily., Disp: 90 tablet, Rfl: 0   metFORMIN (GLUCOPHAGE) 500 MG tablet, TAKE 1 TABLET BY MOUTH 2 TIMES DAILY WITH A MEAL., Disp: 180 tablet, Rfl: 1   Multiple Vitamin (MULTIVITAMIN ADULT PO), Take 1 tablet by mouth daily. , Disp: , Rfl:  omeprazole (PRILOSEC) 20 MG capsule, Take 1 capsule (20 mg total) by mouth daily., Disp: 90 capsule, Rfl: 1   PSYLLIUM HUSK PO, , Disp: , Rfl:    Semaglutide, 1 MG/DOSE, (OZEMPIC, 1 MG/DOSE,) 4 MG/3ML SOPN, INJECT 1 MG ONCE A WEEK AS DIRECTED, Disp: 3 mL, Rfl: 2   spironolactone (ALDACTONE) 25 MG tablet, TAKE 1 TABLET (25 MG TOTAL) BY MOUTH DAILY., Disp: 90 tablet, Rfl: 1   Dulaglutide (TRULICITY) 3 MG/0.5ML SOPN, Inject 3 mg as directed once a week. (Patient not taking: Reported on 10/20/2021), Disp: 2 mL, Rfl: 4  Allergies  Allergen Reactions   Sulfa Antibiotics     Objective:   BP 124/72    Pulse 88    Temp 98 F (36.7 C) (Temporal)    Ht 5\' 10"  (1.778 m)    Wt (!)  337 lb 6.4 oz (153 kg)    BMI 48.41 kg/m    Physical Exam Vitals reviewed.  Constitutional:      General: He is not in acute distress.    Appearance: Normal appearance. He is not ill-appearing, toxic-appearing or diaphoretic.  HENT:     Head: Normocephalic and atraumatic.  Eyes:     General: No scleral icterus.       Right eye: No discharge.        Left eye: No discharge.     Conjunctiva/sclera: Conjunctivae normal.  Cardiovascular:     Rate and Rhythm: Normal rate.  Pulmonary:     Effort: Pulmonary effort is normal. No respiratory distress.  Musculoskeletal:        General: Normal range of motion.     Cervical back: Normal range of motion.     Lumbar back: Bony tenderness present. No swelling, edema, deformity, signs of trauma or lacerations. Negative right straight leg raise test and negative left straight leg raise test.  Skin:    General: Skin is warm and dry.  Neurological:     Mental Status: He is alert and oriented to person, place, and time. Mental status is at baseline.  Psychiatric:        Mood and Affect: Mood normal.        Behavior: Behavior normal.        Thought Content: Thought content normal.        Judgment: Judgment normal.

## 2021-10-24 ENCOUNTER — Other Ambulatory Visit: Payer: Self-pay | Admitting: Family Medicine

## 2021-10-24 DIAGNOSIS — E079 Disorder of thyroid, unspecified: Secondary | ICD-10-CM

## 2021-10-24 DIAGNOSIS — E1165 Type 2 diabetes mellitus with hyperglycemia: Secondary | ICD-10-CM

## 2021-10-24 DIAGNOSIS — E782 Mixed hyperlipidemia: Secondary | ICD-10-CM

## 2021-10-24 DIAGNOSIS — I10 Essential (primary) hypertension: Secondary | ICD-10-CM

## 2021-10-26 ENCOUNTER — Ambulatory Visit: Payer: Self-pay | Admitting: Family Medicine

## 2021-10-26 ENCOUNTER — Ambulatory Visit (INDEPENDENT_AMBULATORY_CARE_PROVIDER_SITE_OTHER): Payer: 59

## 2021-10-26 DIAGNOSIS — M5441 Lumbago with sciatica, right side: Secondary | ICD-10-CM

## 2021-10-26 DIAGNOSIS — M5442 Lumbago with sciatica, left side: Secondary | ICD-10-CM

## 2021-10-26 DIAGNOSIS — G8929 Other chronic pain: Secondary | ICD-10-CM

## 2021-10-26 IMAGING — DX DG LUMBAR SPINE 2-3V
2 series · 2 of 2 positions shown · non-contrast
Comparison: Abdomen [DATE].

CLINICAL DATA: History of low back pain.

EXAM:
LUMBAR SPINE - 2-3 VIEW

[l-spine ap]
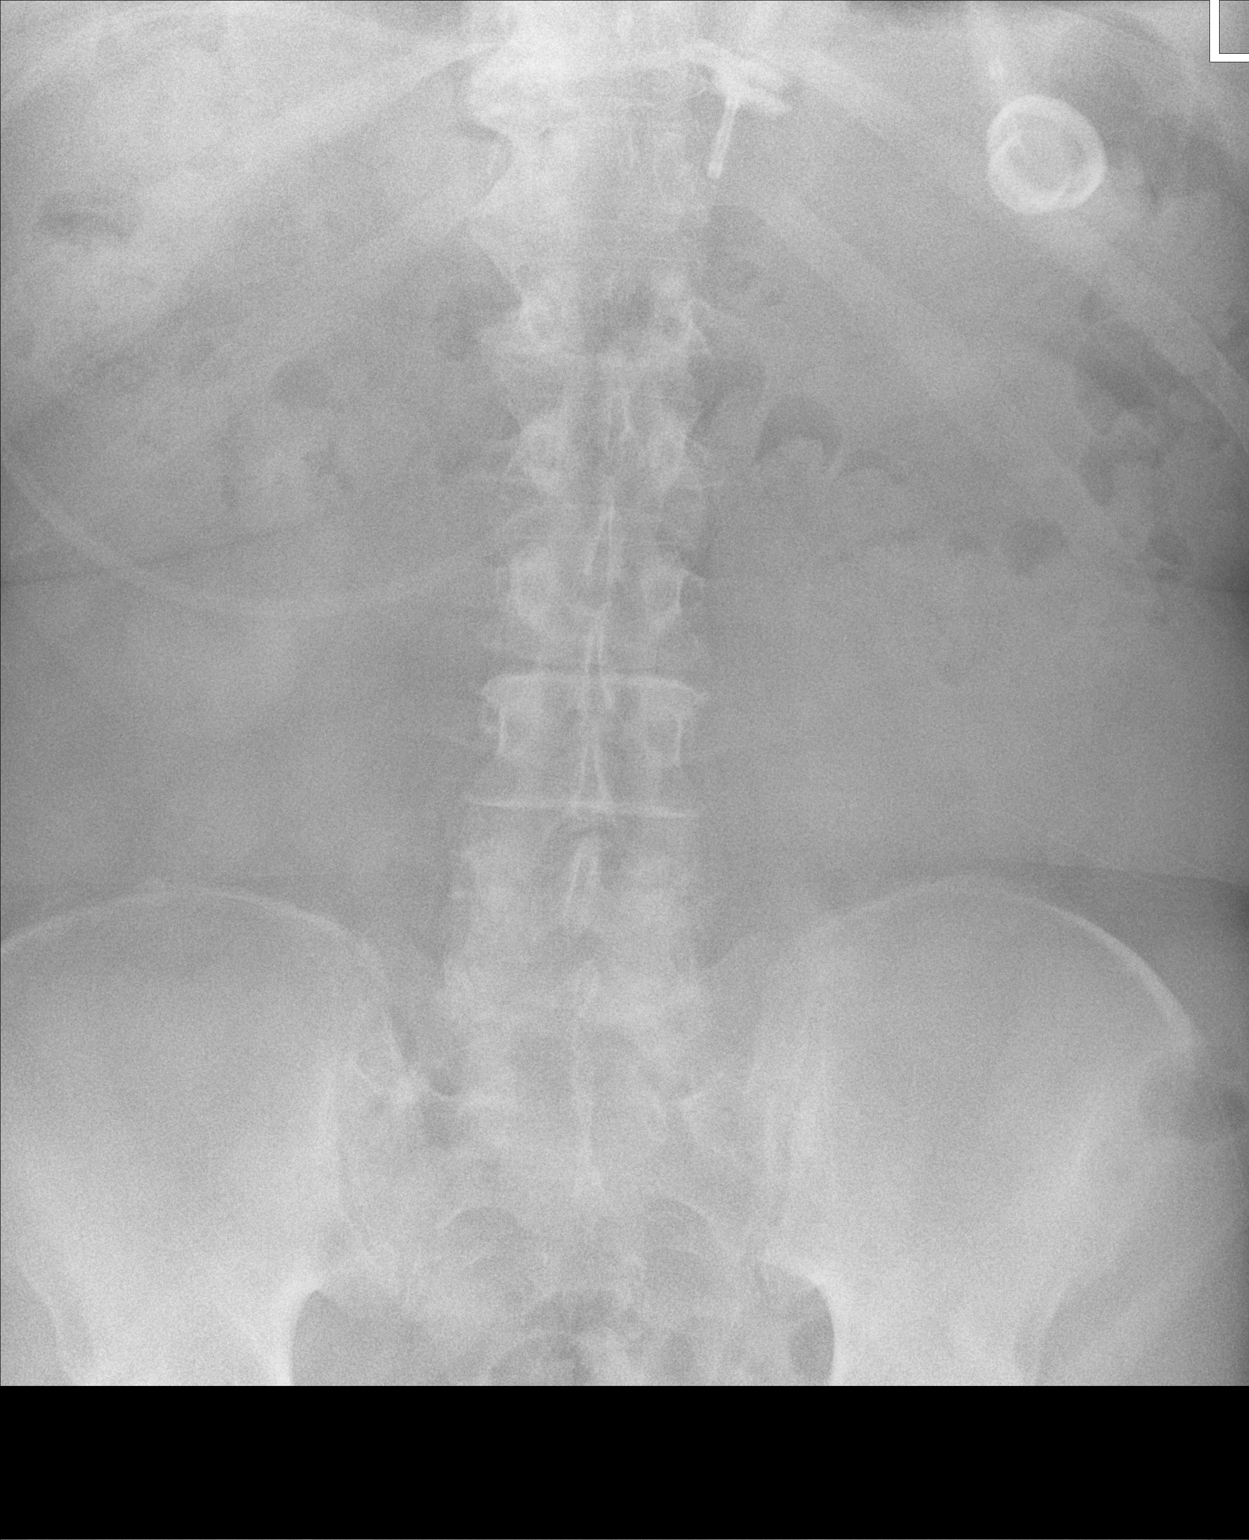

[l-spine lat]
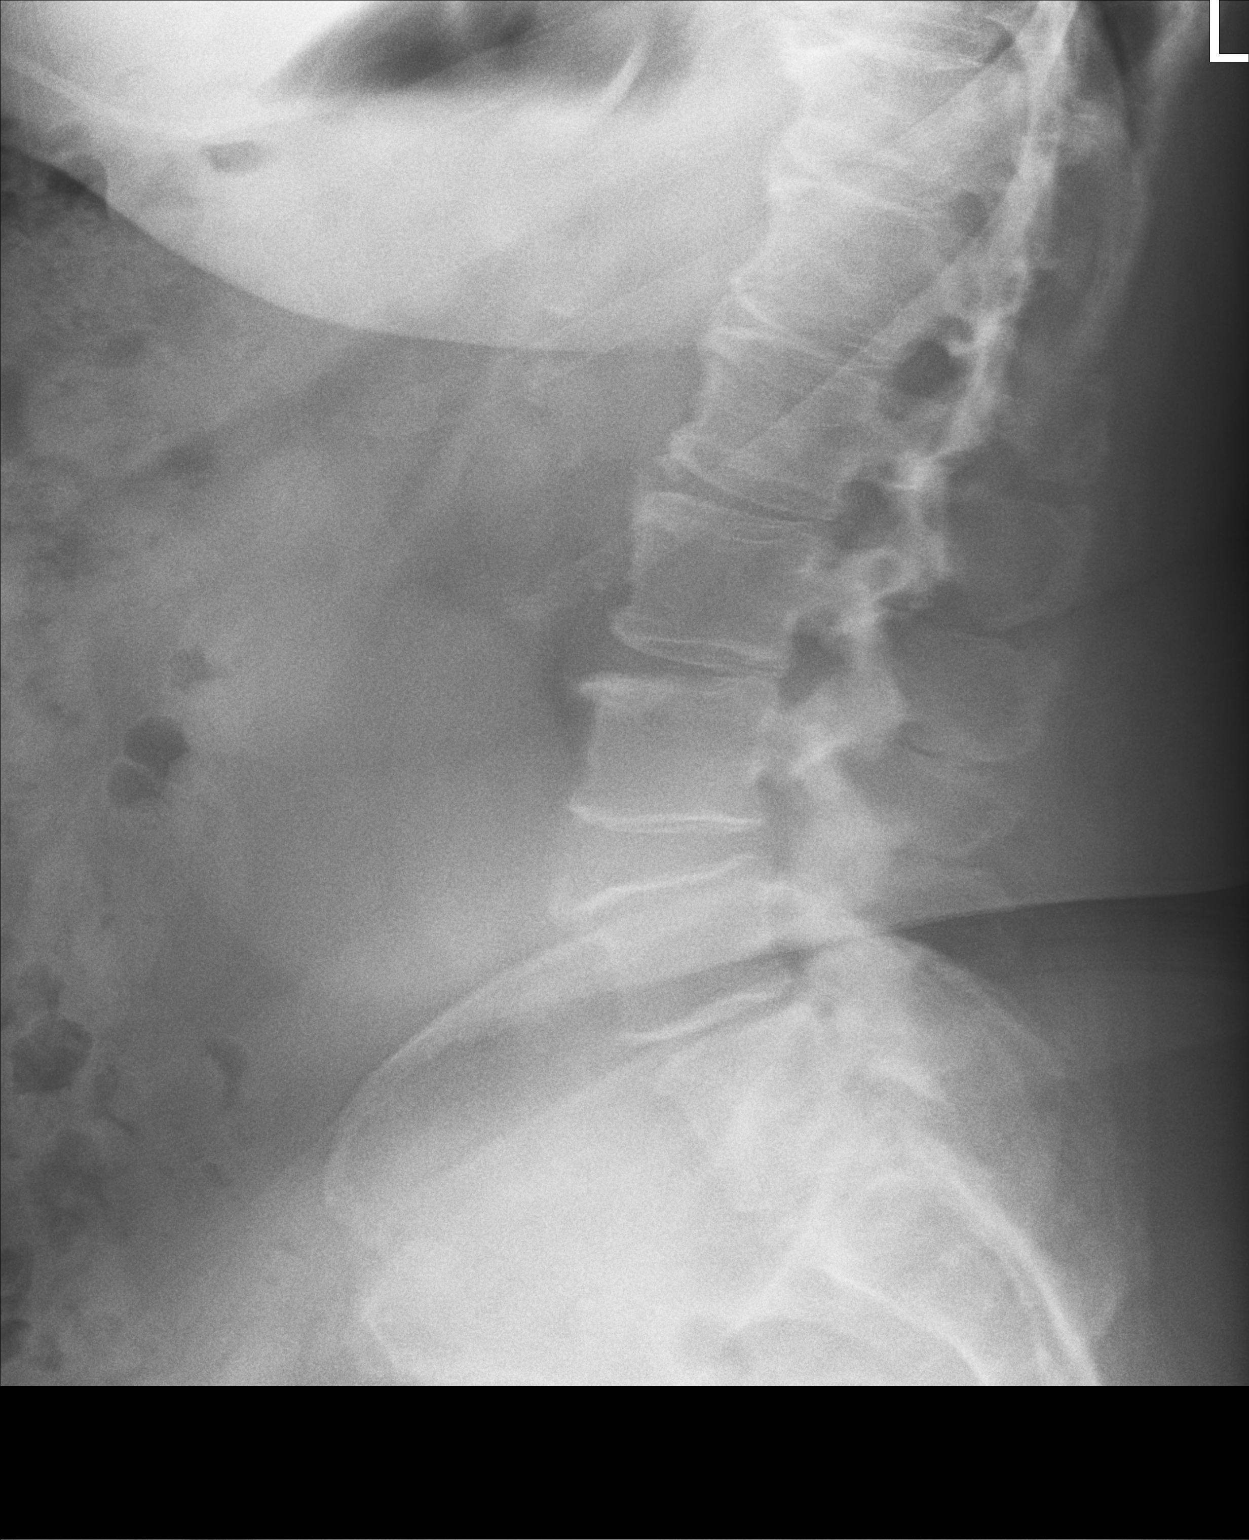

[2 of 2 positions shown; findings below may reference images not displayed]

FINDINGS: Partially visualized lap band noted. Diffuse multilevel degenerative
change lumbar spine with multilevel disc degeneration, endplate
osteophyte formation, and facet hypertrophy. No acute bony
abnormality identified.
IMPRESSION: Diffuse multilevel degenerative change with multilevel disc
degeneration, endplate osteophyte formation and facet hypertrophy.
No acute abnormality identified.

## 2021-10-27 ENCOUNTER — Encounter: Payer: Self-pay | Admitting: Family Medicine

## 2021-10-27 ENCOUNTER — Other Ambulatory Visit: Payer: Self-pay | Admitting: Family Medicine

## 2021-10-27 DIAGNOSIS — M5442 Lumbago with sciatica, left side: Secondary | ICD-10-CM

## 2021-10-27 DIAGNOSIS — G8929 Other chronic pain: Secondary | ICD-10-CM

## 2021-11-02 ENCOUNTER — Encounter: Payer: Self-pay | Admitting: Family Medicine

## 2021-11-02 ENCOUNTER — Ambulatory Visit (INDEPENDENT_AMBULATORY_CARE_PROVIDER_SITE_OTHER): Payer: 59 | Admitting: Family Medicine

## 2021-11-02 VITALS — BP 130/90 | HR 89 | Temp 97.0°F | Ht 70.0 in | Wt 333.4 lb

## 2021-11-02 DIAGNOSIS — M5441 Lumbago with sciatica, right side: Secondary | ICD-10-CM | POA: Diagnosis not present

## 2021-11-02 DIAGNOSIS — F1290 Cannabis use, unspecified, uncomplicated: Secondary | ICD-10-CM | POA: Diagnosis not present

## 2021-11-02 DIAGNOSIS — M5442 Lumbago with sciatica, left side: Secondary | ICD-10-CM

## 2021-11-02 DIAGNOSIS — G8929 Other chronic pain: Secondary | ICD-10-CM

## 2021-11-02 DIAGNOSIS — Z79899 Other long term (current) drug therapy: Secondary | ICD-10-CM | POA: Diagnosis not present

## 2021-11-02 MED ORDER — TRAMADOL HCL 50 MG PO TABS: 50.0000 mg | ORAL_TABLET | Freq: Two times a day (BID) | ORAL | 0 refills | Status: AC | PRN

## 2021-11-02 MED ORDER — TRAMADOL HCL 50 MG PO TABS: 50.0000 mg | ORAL_TABLET | Freq: Two times a day (BID) | ORAL | 1 refills | Status: DC | PRN

## 2021-11-02 NOTE — Progress Notes (Signed)
Assessment & Plan:  1. Chronic midline low back pain with bilateral sciatica/Controlled substance agreement signed Uncontrolled. MRI pending. Started Tramadol today which he will only take when the pain is really bad. PDMP reviewed with no concerning findings. Controlled substance agreement signed today. Urine drug screen collected today.  - ToxASSURE Select 13 (MW), Urine - traMADol (ULTRAM) 50 MG tablet; Take 1 tablet (50 mg total) by mouth 2 (two) times daily as needed for up to 5 days.  Dispense: 10 tablet; Refill: 0 - traMADol (ULTRAM) 50 MG tablet; Take 1 tablet (50 mg total) by mouth 2 (two) times daily as needed.  Dispense: 30 tablet; Refill: 1  3. Marijuana use Patient admits to smoking occasionally and is agreeable in quitting in order to control his back pain. He understands if he tests positive in the future he will no longer be able to get controlled substances.    Return as scheduled.  Hendricks Limes, MSN, APRN, FNP-C [facility]  Subjective:    Patient ID: Daniel Scott, male    DOB: November 12, 1960, 61 y.o.   MRN: [id number]  Patient Care Team: Loman Brooklyn, FNP as PCP - General (Family Medicine) Okey Regal, OD (Optometry)   Chief Complaint:  Chief Complaint  Patient presents with   Pain Management    HPI: Daniel Scott is a 61 y.o. male presenting on 11/02/2021 for Pain Management  Pain assessment: Cause of pain- degenerative changes  Lumbar spine x-ray on 10/26/2021: Diffuse multilevel degenerative change with multilevel disc degeneration, endplate osteophyte formation and facet hypertrophy.  Pain location- low back Pain on scale of 1-10- 4/10 on average; sometimes up to 8-9/10 Frequency- daily What increases pain- walking, bending What makes pain better- sitting, resting Effects on ADL- makes difficult at times Any change in general medical condition- none  Current opioids rx- new start of Tramadol today # meds rx-  30 Effectiveness of current meds- unable to assess - new rx Adverse reactions from pain meds- none Morphine equivalent- 10 MME/day  Pill count performed-No Last drug screen - never (new prescription) ( high risk q5m moderate risk q649mlow risk yearly ) Urine drug screen today- Yes Was the NC[place]viewed- Yes If yes were their any concerning findings?- No  Overdose risk: 110  Opioid Risk  11/02/2021  Alcohol 0  Illegal Drugs 0  Rx Drugs 0  Alcohol 0  Illegal Drugs 0  Rx Drugs 0  Age between 16-45 years  0  History of Preadolescent Sexual Abuse 0  Psychological Disease 0  Depression 0  Opioid Risk Tool Scoring 0  Opioid Risk Interpretation Low Risk   Pain contract signed on: 11/02/2021  Patient does admit he occasionally smokes marijuana.   New complaints: None   Social history:  Relevant past medical, surgical, family and social history reviewed and updated as indicated. Interim medical history since our last visit reviewed.  Allergies and medications reviewed and updated.  DATA REVIEWED: CHART IN EPIC  ROS: Negative unless specifically indicated above in HPI.    Current Outpatient Medications:    amLODipine (NORVASC) 10 MG tablet, TAKE 1 TABLET BY MOUTH EVERY DAY, Disp: 90 tablet, Rfl: 0   Apple Cider Vinegar 500 MG TABS, , Disp: , Rfl:    aspirin 325 MG EC tablet, Take 325 mg by mouth daily., Disp: , Rfl:    atorvastatin (LIPITOR) 80 MG tablet, TAKE 1 TABLET BY MOUTH EVERY DAY, Disp: 90 tablet, Rfl: 0   Cholecalciferol (  DIALYVITE VITAMIN D 5000) 125 MCG (5000 UT) capsule, Take 5,000 Units by mouth daily., Disp: , Rfl:    CINNAMON PO, , Disp: , Rfl:    Cyanocobalamin (B-12) 5000 MCG CAPS, Take 5,000 mcg by mouth daily., Disp: , Rfl:    Garlic 6045 MG CAPS, Take 1,000 mg by mouth 2 (two) times daily., Disp: , Rfl:    JARDIANCE 25 MG TABS tablet, TAKE 1 TABLET BY MOUTH DAILY BEFORE BREAKFAST., Disp: 30 tablet, Rfl: 2   Krill Oil 500 MG CAPS, Take 500 mg by mouth  daily., Disp: , Rfl:    levothyroxine (SYNTHROID) 75 MCG tablet, TAKE 1 TABLET BY MOUTH EVERY DAY BEFORE BREAKFAST, Disp: 90 tablet, Rfl: 1   lisinopril (ZESTRIL) 20 MG tablet, Take 1 tablet (20 mg total) by mouth daily., Disp: 90 tablet, Rfl: 0   metFORMIN (GLUCOPHAGE) 500 MG tablet, TAKE 1 TABLET BY MOUTH 2 TIMES DAILY WITH A MEAL., Disp: 180 tablet, Rfl: 1   Multiple Vitamin (MULTIVITAMIN ADULT PO), Take 1 tablet by mouth daily. , Disp: , Rfl:    omeprazole (PRILOSEC) 20 MG capsule, Take 1 capsule (20 mg total) by mouth daily., Disp: 90 capsule, Rfl: 1   PSYLLIUM HUSK PO, , Disp: , Rfl:    Semaglutide, 1 MG/DOSE, (OZEMPIC, 1 MG/DOSE,) 4 MG/3ML SOPN, INJECT 1 MG ONCE A WEEK AS DIRECTED, Disp: 3 mL, Rfl: 2   spironolactone (ALDACTONE) 25 MG tablet, TAKE 1 TABLET (25 MG TOTAL) BY MOUTH DAILY., Disp: 90 tablet, Rfl: 1   Dulaglutide (TRULICITY) 3 WU/9.8JX SOPN, Inject 3 mg as directed once a week. (Patient not taking: Reported on 11/02/2021), Disp: 2 mL, Rfl: 4   Allergies  Allergen Reactions   Sulfa Antibiotics    Past Medical History:  Diagnosis Date   Arthritis    hands and feet   Depression    Diabetes mellitus without complication ([place])    type 2   Fatty liver    GERD (gastroesophageal reflux disease)    Hiatal hernia    Hyperlipidemia    Hypertension    Hypothyroidism    Neuromuscular disorder ([place])    stenosis   Pneumonia 1990   Stenosis of cervical spine    Thyroid disease     Past Surgical History:  Procedure Laterality Date   ANTERIOR CERVICAL DECOMP/DISCECTOMY FUSION N/A 06/02/2020   Procedure: ANTERIOR CERVICAL DECOMPRESSION/DISCECTOMY FUSIONCERVICAL FOUR- CERVICAL FIVE, CERVICAL FIVE- CERVICAL SIX;  Surgeon: Dawley, Theodoro Doing, DO;  Location: [place];  Service: Neurosurgery;  Laterality: N/A;  ANTERIOR CERVICAL DECOMPRESSION/DISCECTOMY FUSIONCERVICAL FOUR- CERVICAL FIVE, CERVICAL FIVE- CERVICAL SIX   CARDIAC CATHETERIZATION  1996; 2006   [facility]    HYDROCELE EXCISION     LAPAROSCOPIC GASTRIC BANDING     LAPAROSCOPIC ROUX-EN-Y GASTRIC BYPASS WITH UPPER ENDOSCOPY AND REMOVAL OF LAP BAND  02/2012   TONSILLECTOMY      Social History   Socioeconomic History   Marital status: Married    Spouse name: Not on file   Number of children: Not on file   Years of education: Not on file   Highest education level: Not on file  Occupational History   Not on file  Tobacco Use   Smoking status: Former    Types: Cigarettes    Quit date: 05/2009    Years since quitting: 12.5   Smokeless tobacco: Never  Vaping Use   Vaping Use: Never used  Substance and Sexual Activity   Alcohol use: Yes  Comment: daily   Drug use: Yes    Types: Marijuana    Comment: occ   Sexual activity: Yes    Birth control/protection: None  Other Topics Concern   Not on file  Social History Narrative   Not on file   Social Determinants of Health   Financial Resource Strain: Not on file  Food Insecurity: Not on file  Transportation Needs: Not on file  Physical Activity: Not on file  Stress: Not on file  Social Connections: Not on file  Intimate Partner Violence: Not on file        Objective:    BP 130/90    Pulse 89    Temp (!) 97 F (36.1 C) (Temporal)    Ht 5' 10" (1.778 m)    Wt (!) 333 lb 6.4 oz (151.2 kg)    SpO2 97%    BMI 47.84 kg/m   Wt Readings from Last 3 Encounters:  11/02/21 (!) 333 lb 6.4 oz (151.2 kg)  10/20/21 (!) 337 lb 6.4 oz (153 kg)  08/03/21 (!) 335 lb (152 kg)    Physical Exam Vitals reviewed.  Constitutional:      General: He is not in acute distress.    Appearance: Normal appearance. He is obese. He is not ill-appearing, toxic-appearing or diaphoretic.  HENT:     Head: Normocephalic and atraumatic.  Eyes:     General: No scleral icterus.       Right eye: No discharge.        Left eye: No discharge.     Conjunctiva/sclera: Conjunctivae normal.  Cardiovascular:     Rate and Rhythm: Normal rate.  Pulmonary:      Effort: Pulmonary effort is normal. No respiratory distress.  Musculoskeletal:        General: Normal range of motion.     Cervical back: Normal range of motion.  Skin:    General: Skin is warm and dry.  Neurological:     Mental Status: He is alert and oriented to person, place, and time. Mental status is at baseline.  Psychiatric:        Mood and Affect: Mood normal.        Behavior: Behavior normal.        Thought Content: Thought content normal.        Judgment: Judgment normal.    Lab Results  Component Value Date   TSH 1.380 09/25/2020   Lab Results  Component Value Date   WBC 6.8 08/03/2021   HGB 15.0 08/03/2021   HCT 43.6 08/03/2021   MCV 94 08/03/2021   PLT 241 08/03/2021   Lab Results  Component Value Date   NA 137 08/03/2021   K 4.8 08/03/2021   CO2 24 08/03/2021   GLUCOSE 150 (H) 08/03/2021   BUN 16 08/03/2021   CREATININE 0.88 08/03/2021   BILITOT 0.2 08/03/2021   ALKPHOS 85 08/03/2021   AST 25 08/03/2021   ALT 32 08/03/2021   PROT 6.2 08/03/2021   ALBUMIN 4.4 08/03/2021   CALCIUM 9.3 08/03/2021   ANIONGAP 12 11/03/2020   EGFR 97 08/03/2021   Lab Results  Component Value Date   CHOL 180 08/03/2021   Lab Results  Component Value Date   HDL 43 08/03/2021   Lab Results  Component Value Date   LDLCALC 96 08/03/2021   Lab Results  Component Value Date   TRIG 241 (H) 08/03/2021   Lab Results  Component Value Date   CHOLHDL 4.2 08/03/2021  Lab Results  Component Value Date   HGBA1C 6.8 (H) 08/03/2021

## 2021-11-04 LAB — TOXASSURE SELECT 13 (MW), URINE

## 2021-11-08 ENCOUNTER — Encounter: Payer: Self-pay | Admitting: Family Medicine

## 2021-11-09 MED ORDER — GABAPENTIN 300 MG PO CAPS: 300.0000 mg | ORAL_CAPSULE | Freq: Two times a day (BID) | ORAL | 1 refills | Status: DC

## 2021-11-10 ENCOUNTER — Other Ambulatory Visit: Payer: Self-pay | Admitting: Family Medicine

## 2021-11-10 DIAGNOSIS — I10 Essential (primary) hypertension: Secondary | ICD-10-CM

## 2021-11-10 DIAGNOSIS — E1165 Type 2 diabetes mellitus with hyperglycemia: Secondary | ICD-10-CM

## 2021-11-10 DIAGNOSIS — E782 Mixed hyperlipidemia: Secondary | ICD-10-CM

## 2021-11-16 ENCOUNTER — Ambulatory Visit (HOSPITAL_COMMUNITY)
Admission: RE | Admit: 2021-11-16 | Discharge: 2021-11-16 | Disposition: A | Discharge status: Home / Self Care | Payer: 59 | Source: Ambulatory Visit | Attending: Family Medicine | Admitting: Family Medicine

## 2021-11-16 ENCOUNTER — Other Ambulatory Visit: Payer: Self-pay

## 2021-11-16 DIAGNOSIS — G8929 Other chronic pain: Secondary | ICD-10-CM | POA: Diagnosis present

## 2021-11-16 DIAGNOSIS — M5442 Lumbago with sciatica, left side: Secondary | ICD-10-CM | POA: Insufficient documentation

## 2021-11-16 DIAGNOSIS — M5441 Lumbago with sciatica, right side: Secondary | ICD-10-CM | POA: Insufficient documentation

## 2021-11-16 IMAGING — MR MR LUMBAR SPINE W/O CM
5 series · 32 of 48 positions shown · non-contrast
Comparison: Radiographs [DATE]

CLINICAL DATA: Low back pain radiating down into both legs for 1
year

EXAM:
MRI LUMBAR SPINE WITHOUT CONTRAST
TECHNIQUE: Multiplanar, multisequence MR imaging of the lumbar spine was
performed. No intravenous contrast was administered.

[Series 5: T2 · sagittal · 4.0mm · 0.81mm/px · 6 of 16 slices shown (1 of 2)]
[im 1/16]
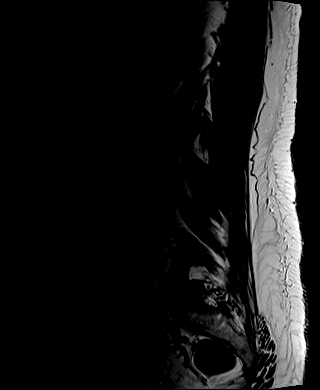
[im 4/16]
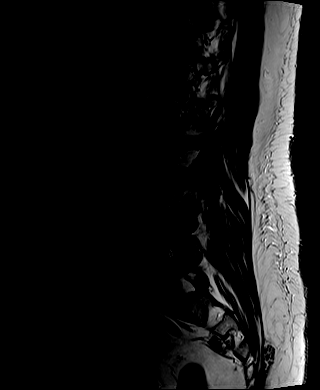
[im 7/16]
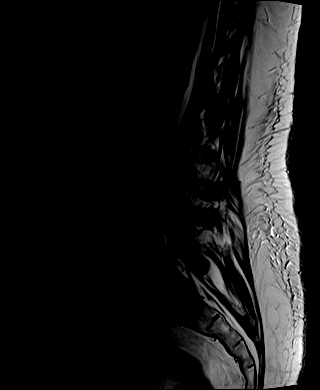
[im 10/16]
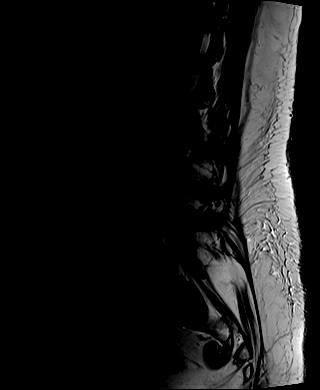
[im 13/16]
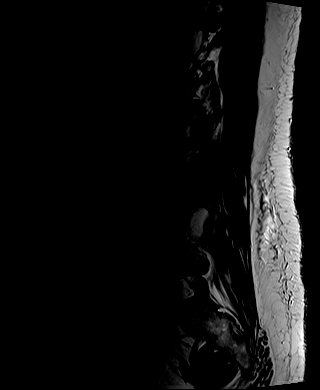
[im 16/16]
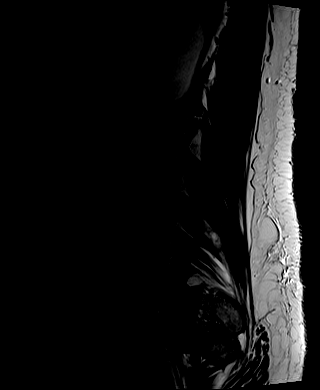

[Series 6: STIR · sagittal · 4.0mm · 0.51mm/px · 3 of 16 slices shown]
[im 1/16]
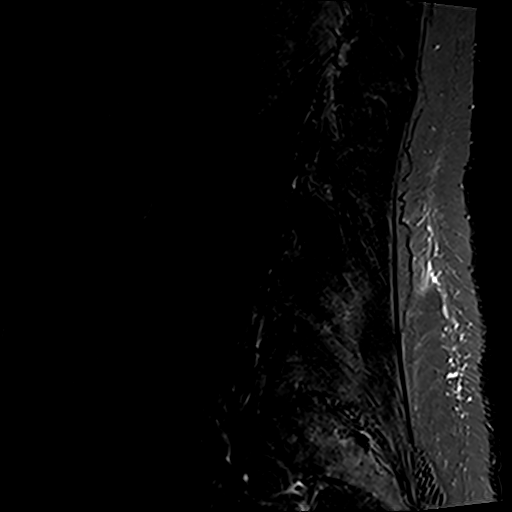
[im 3/16]
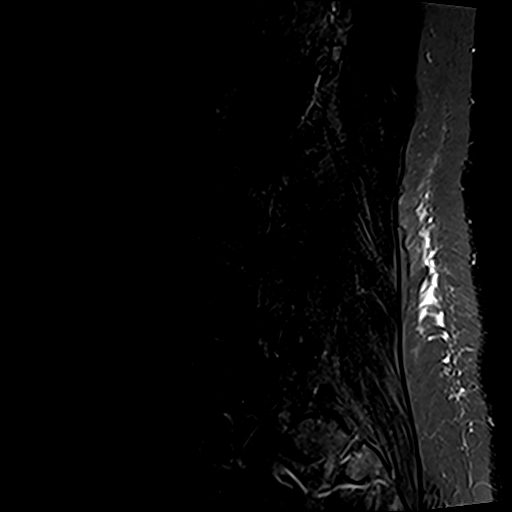
[im 6/16]
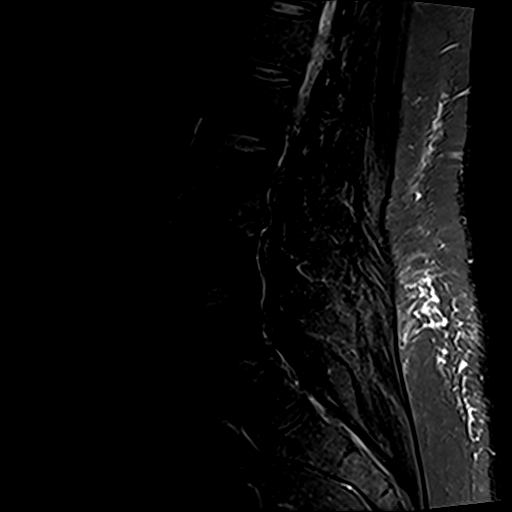

[Series 7: T1 · sagittal · 4.0mm · 1.02mm/px · 7 of 16 slices shown (1 of 2)]
[im 1/16]
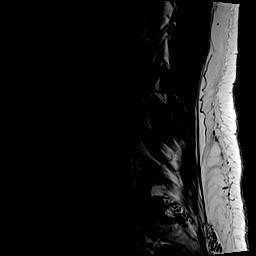
[im 3/16]
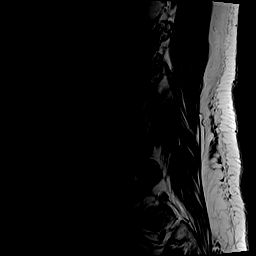
[im 6/16]
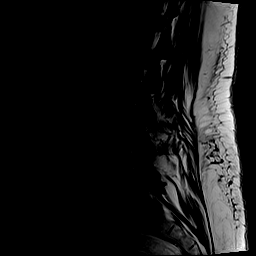
[im 8/16]
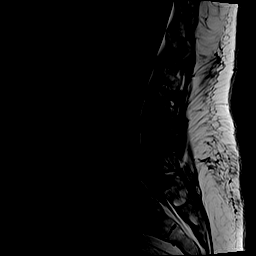
[im 11/16]
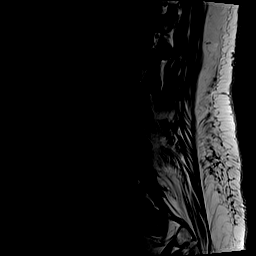
[im 13/16]
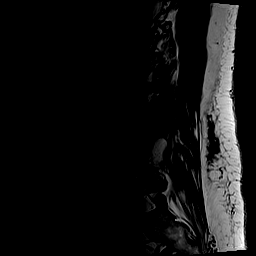
[im 16/16]
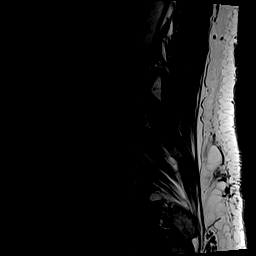

[Series 8: T2 · axial · 4.0mm · 0.70mm/px · z∈[+1,+200]mm · 8 of 33 slices shown (2 of 2)]
[im 1/33]
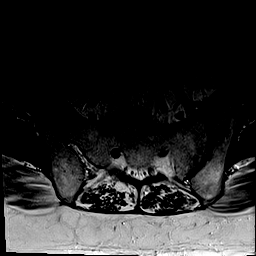
[im 5/33]
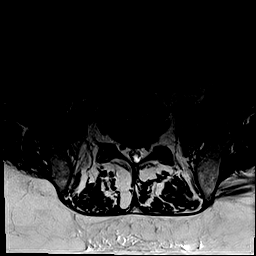
[im 10/33]
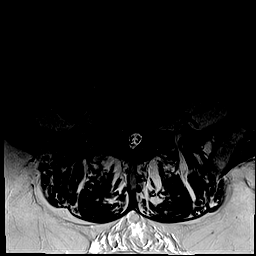
[im 15/33]
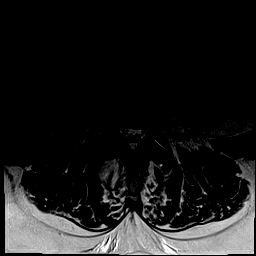
[im 18/33]
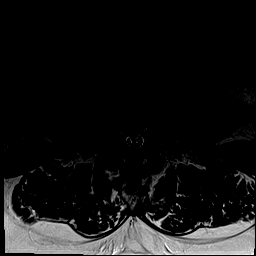
[im 23/33]
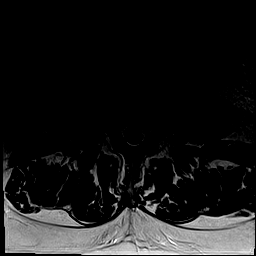
[im 28/33]
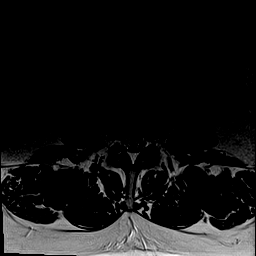
[im 33/33]
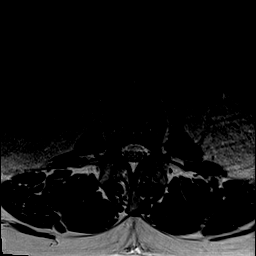

[Series 9: T1 · axial · 4.0mm · 0.35mm/px · z∈[+1,+200]mm · 8 of 33 slices shown (2 of 2)]
[im 1/33]
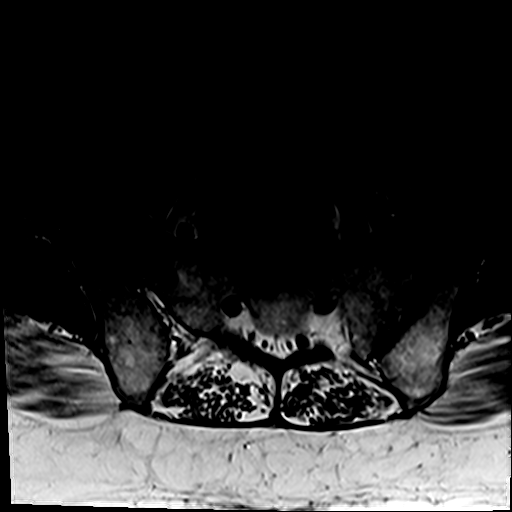
[im 5/33]
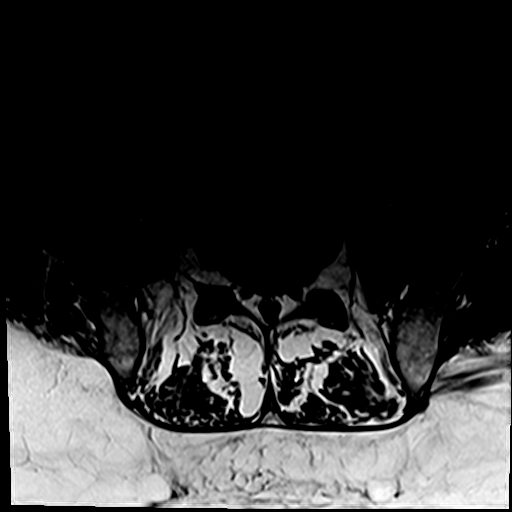
[im 10/33]
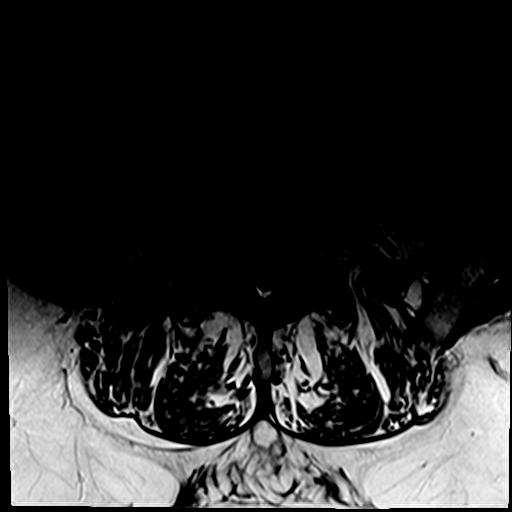
[im 15/33]
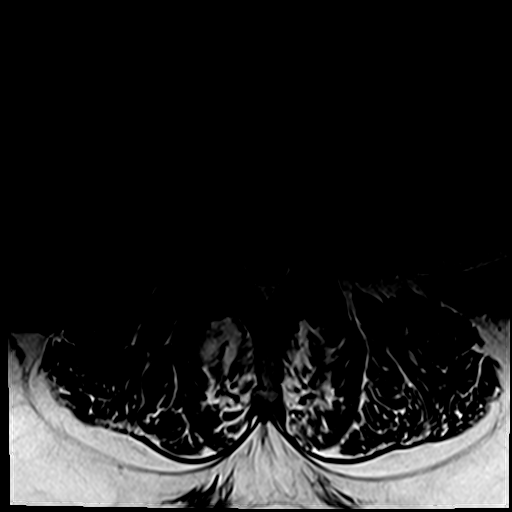
[im 18/33]
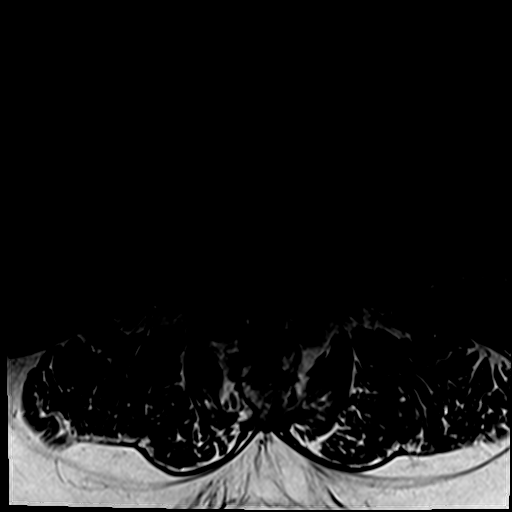
[im 23/33]
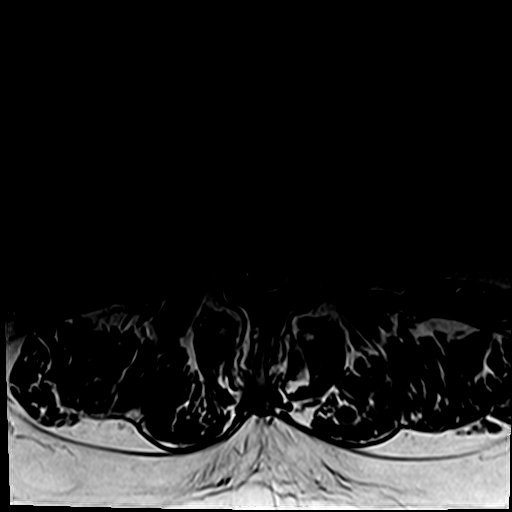
[im 28/33]
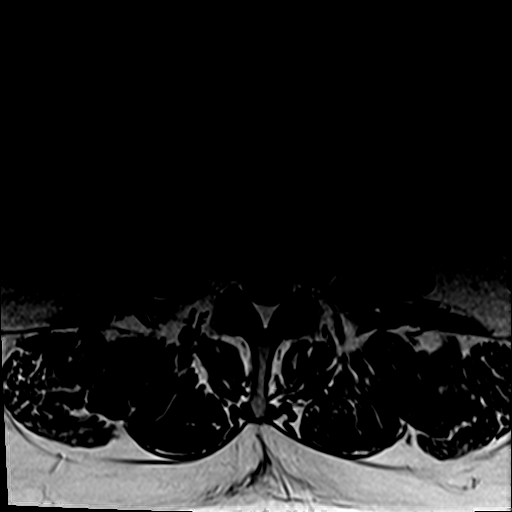
[im 33/33]
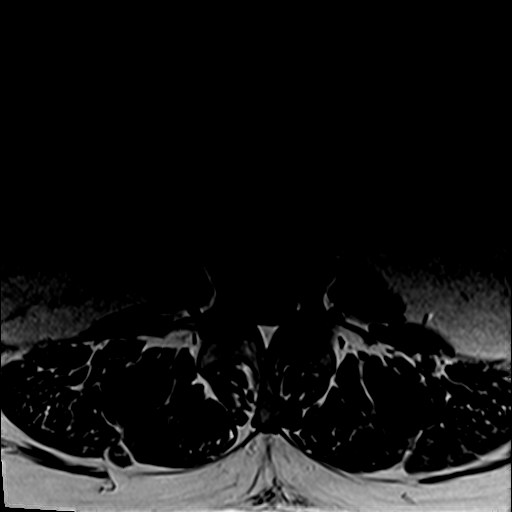

[32 of 48 positions shown; findings below may reference images not displayed]

FINDINGS: Segmentation: There is a partially segmental S1 vertebra but the
lowest fully segmental non rib-bearing vertebra is labeled as L5.

Alignment:  No vertebral subluxation is observed.

Vertebrae: Disc desiccation at L3-4 and L5-S1 with loss of disc
height especially at L5-S1. Rudimentary disc material at the S1-2
level. Mild degenerative endplate findings at L3-4.

Conus medullaris and cauda equina: Conus extends to the L1 level.
Conus and cauda equina appear normal.

Paraspinal and other soft tissues: Fluid signal intensity structure
below the left kidney probably represents a large renal cyst but is
only partially included on today's exam.

Disc levels:

L1-2: No impingement. Small right paracentral disc protrusion. This
level is only included on the parasagittal images.

L2-3: Unremarkable

L3-4: Mild to moderate central narrowing of the thecal sac and mild
left foraminal stenosis with mild left subarticular lateral recess
stenosis and mild deviation of the left L3 nerve in the lateral
extraforaminal space due disc bulge, left lateral recess disc
protrusion, intervertebral spurring, and short pedicles.

L4-5: Mild bilateral foraminal stenosis and mild central narrowing
of the thecal sac due to disc bulge and short pedicles.

L5-S1: Moderate left and mild right foraminal stenosis with mild
bilateral subarticular lateral recess stenosis due to disc bulge,
central disc protrusion, intervertebral spurring, and degenerative
facet arthropathy.

S1-2: No impingement.
IMPRESSION: 1. Please note that there is a partially segmental S1 vertebra but
the lowest fully segmental non-rib-bearing vertebra is labeled L5.
2. Lumbar spondylosis, degenerative disc disease, and congenitally
short pedicles contributing to moderate impingement at L5-S1; mild
to moderate impingement at L3-4; and mild impingement at L4-5.
3. Large fluid signal intensity lesion below the left kidney is
partially included on today's examination and is most likely to be a
left renal cyst, but not fully characterized.

## 2021-11-17 ENCOUNTER — Encounter: Payer: Self-pay | Admitting: Family Medicine

## 2021-11-17 DIAGNOSIS — M5416 Radiculopathy, lumbar region: Secondary | ICD-10-CM

## 2021-11-17 DIAGNOSIS — G8929 Other chronic pain: Secondary | ICD-10-CM

## 2021-11-23 NOTE — Addendum Note (Signed)
Addended by: Hendricks Limes F on: 11/23/2021 01:10 PM ? ? Modules accepted: Orders ? ?

## 2021-11-24 DIAGNOSIS — G8929 Other chronic pain: Secondary | ICD-10-CM

## 2021-11-24 DIAGNOSIS — M5416 Radiculopathy, lumbar region: Secondary | ICD-10-CM

## 2021-11-30 ENCOUNTER — Ambulatory Visit (INDEPENDENT_AMBULATORY_CARE_PROVIDER_SITE_OTHER): Payer: 59 | Admitting: Family Medicine

## 2021-11-30 ENCOUNTER — Encounter: Payer: Self-pay | Admitting: Family Medicine

## 2021-11-30 VITALS — BP 122/89 | HR 90 | Temp 97.7°F | Ht 70.0 in | Wt 341.0 lb

## 2021-11-30 DIAGNOSIS — M5441 Lumbago with sciatica, right side: Secondary | ICD-10-CM

## 2021-11-30 DIAGNOSIS — M5442 Lumbago with sciatica, left side: Secondary | ICD-10-CM

## 2021-11-30 DIAGNOSIS — I700 Atherosclerosis of aorta: Secondary | ICD-10-CM | POA: Diagnosis not present

## 2021-11-30 DIAGNOSIS — M5416 Radiculopathy, lumbar region: Secondary | ICD-10-CM

## 2021-11-30 DIAGNOSIS — E1165 Type 2 diabetes mellitus with hyperglycemia: Secondary | ICD-10-CM

## 2021-11-30 DIAGNOSIS — Z79899 Other long term (current) drug therapy: Secondary | ICD-10-CM

## 2021-11-30 DIAGNOSIS — I10 Essential (primary) hypertension: Secondary | ICD-10-CM | POA: Diagnosis not present

## 2021-11-30 DIAGNOSIS — E782 Mixed hyperlipidemia: Secondary | ICD-10-CM | POA: Diagnosis not present

## 2021-11-30 DIAGNOSIS — K219 Gastro-esophageal reflux disease without esophagitis: Secondary | ICD-10-CM

## 2021-11-30 DIAGNOSIS — E039 Hypothyroidism, unspecified: Secondary | ICD-10-CM

## 2021-11-30 DIAGNOSIS — G8929 Other chronic pain: Secondary | ICD-10-CM

## 2021-11-30 LAB — BAYER DCA HB A1C WAIVED: HB A1C (BAYER DCA - WAIVED): 8 % — ABNORMAL HIGH (ref 4.8–5.6)

## 2021-11-30 MED ORDER — OMEPRAZOLE 20 MG PO CPDR: 20.0000 mg | DELAYED_RELEASE_CAPSULE | Freq: Every day | ORAL | 1 refills | Status: DC

## 2021-11-30 MED ORDER — TRAMADOL HCL 50 MG PO TABS: 50.0000 mg | ORAL_TABLET | Freq: Two times a day (BID) | ORAL | 2 refills | Status: DC | PRN

## 2021-11-30 MED ORDER — LISINOPRIL 20 MG PO TABS: 20.0000 mg | ORAL_TABLET | Freq: Every day | ORAL | 1 refills | Status: DC

## 2021-11-30 MED ORDER — GABAPENTIN 300 MG PO CAPS: 600.0000 mg | ORAL_CAPSULE | Freq: Three times a day (TID) | ORAL | 1 refills | Status: DC

## 2021-11-30 NOTE — Progress Notes (Signed)
? ?Assessment & Plan:  ?1. Type 2 diabetes mellitus with hyperglycemia, without long-term current use of insulin ([place]) ?Lab Results  ?Component Value Date  ? HGBA1C 8.0 (H) 11/30/2021  ? HGBA1C 6.8 (H) 08/03/2021  ? HGBA1C 7.1 (H) 03/25/2021  ?  ?- Diabetes is not at goal of A1c < 7, likely due to switching medications and not being on the same one consistently. ?- Medications:  continue Ozempic 1 mg weekly. No more Trulicity since it is not kept available for patient's needs. ?- Patient is currently taking a statin. Patient is taking an ACE-inhibitor/ARB.  ? ?Diabetes Health Maintenance Due  ?Topic Date Due  ? FOOT EXAM  03/25/2022  ? OPHTHALMOLOGY EXAM  05/28/2022  ? HEMOGLOBIN A1C  06/02/2022  ?  ?Lab Results  ?Component Value Date  ? LABMICR <3.0 11/30/2021  ? LABMICR See below: 08/25/2020  ? ?- Lipid panel ?- CBC with Differential/Platelet ?- CMP14+EGFR ?- Bayer DCA Hb A1c Waived ?- Vitamin B12 ?- Microalbumin / creatinine urine ratio ?- lisinopril (ZESTRIL) 20 MG tablet; Take 1 tablet (20 mg total) by mouth daily.  Dispense: 90 tablet; Refill: 1 ? ?2. Essential hypertension ?Well controlled on current regimen.  ?- Lipid panel ?- CBC with Differential/Platelet ?- CMP14+EGFR ?- lisinopril (ZESTRIL) 20 MG tablet; Take 1 tablet (20 mg total) by mouth daily.  Dispense: 90 tablet; Refill: 1 ? ?3. Mixed hyperlipidemia ?Well controlled on current regimen.  ?- Lipid panel ?- CBC with Differential/Platelet ?- CMP14+EGFR ? ?4. Aortic atherosclerosis ([place]) ?Continue statin and aspirin. ?- Lipid panel ?- CMP14+EGFR ? ?5. Acquired hypothyroidism ?Well controlled on current regimen.  ?- TSH ?- T4, Free ? ?6. Chronic midline low back pain with bilateral sciatica/Lumbar nerve root impingement/Controlled substance agreement signed ?Uncontrolled. Gabapentin increased by 300 mg every 3-5 days until he is either controlled or at 600 mg TID. He has been referred to neurosurgery and is looking into his options since his insurance  will not cover local practices. He has been referred to ortho as well to see if they can offer him some relief. Controlled substance agreement in place. Urine drug screen as expected. PDMP reviewed with no concerning findings.  ?- CMP14+EGFR ?- traMADol (ULTRAM) 50 MG tablet; Take 1 tablet (50 mg total) by mouth 2 (two) times daily as needed.  Dispense: 60 tablet; Refill: 2 ?- gabapentin (NEURONTIN) 300 MG capsule; Take 2 capsules (600 mg total) by mouth 3 (three) times daily.  Dispense: 540 capsule; Refill: 1 ? ?9. Gastroesophageal reflux disease, unspecified whether esophagitis present ?Well controlled on current regimen.  ?- CMP14+EGFR ?- omeprazole (PRILOSEC) 20 MG capsule; Take 1 capsule (20 mg total) by mouth daily.  Dispense: 90 capsule; Refill: 1 ? ?10. Morbid obesity ([place]) ?Healthy eating and exercise encouraged, which is difficult due to his back pain. ?- Lipid panel ?- CBC with Differential/Platelet ?- CMP14+EGFR ? ? ?Return in about 3 months (around 03/02/2022) for follow-up of chronic medication conditions. ? ?Hendricks Limes, MSN, APRN, FNP-C ?[facility] ? ?Subjective:  ? ? Patient ID: Daniel Scott, Daniel Scott    DOB: Apr 02, 1961, 61 y.o.   MRN: [id number] ? ?Patient Care Team: ?Loman Brooklyn, FNP as PCP - General (Family Medicine) ?Okey Regal, OD (Optometry)  ? ?Chief Complaint:  ?Chief Complaint  ?Patient presents with  ? Medical Management of Chronic Issues  ? ? ?HPI: ?Daniel Scott is a 61 y.o. Daniel Scott presenting on 11/30/2021 for Medical Management of Chronic Issues ? ?Diabetes: Patient presents for  follow up of diabetes. Current symptoms include: none. Known diabetic complications: none. Medication compliance: he has been going back and forth between Trulicity 3 mg and Ozempic 1 mg weekly as the pharmacy has not been able to keep Trulicity in stock due to [employer]. Current diet:  "average" . Current exercise: yard work. He is not checking his blood sugar at home. Is he   on ACE inhibitor or angiotensin II receptor blocker? Yes (Lisinopril). Is he on a statin? Yes (Atorvastatin).  ? ?Hyperlipidemia: taking atorvastatin. ? ?Aortic Atherosclerosis: taking aspirin and atorvastatin.  ? ?Pain assessment: ?Cause of pain- degenerative changes ?  ?Lumbar spine x-ray on 10/26/2021: ?Diffuse multilevel degenerative change with multilevel disc degeneration, endplate osteophyte formation and facet hypertrophy. ? ?MRI lumbar spine 11/19/2021: ?Lumbar spondylosis, degenerative disc disease, and congenitally short pedicles contributing to moderate impingement at L5-S1; mild to moderate impingement at L3-4; and mild impingement at L4-5. ?  ?Pain location- low back ?Pain on scale of 1-10- 4/10 on average; sometimes up to 8-9/10 ?Frequency- daily ?What increases pain- walking, bending ?What makes pain better- sitting, resting ?Effects on ADL- makes difficult at times ?Any change in general medical condition- none ?  ?Current opioids rx- Tramadol 50 mg BID PRN ?# meds rx- 30 ?He is also taking gabapentin 300 mg twice daily which he feels is more helpful for the pain. Previously failed treatment with Ibuprofen and Tizanidine. ?Effectiveness of current meds- a little helpful ?Adverse reactions from pain meds- none ?Morphine equivalent- 10 MME/day ? ?Pill count performed-No ?Last drug screen - 11/02/2021 ?( high risk q43m moderate risk q688mlow risk yearly ) ?Urine drug screen today- No ?Was the [place]eviewed- Yes ? If yes were their any concerning findings? - No ? ?Overdose risk: 230 ? ? ?  11/02/2021  ?  8:58 AM  ?Opioid Risk   ?Alcohol 0  ?Illegal Drugs 0  ?Rx Drugs 0  ?Alcohol 0  ?Illegal Drugs 0  ?Rx Drugs 0  ?Age between 1660-45ears  0  ?History of Preadolescent Sexual Abuse 0  ?Psychological Disease 0  ?Depression 0  ?Opioid Risk Tool Scoring 0  ?Opioid Risk Interpretation Low Risk  ? ?Pain contract signed on: 11/02/2021 ?  ? ?New complaints: ?None ? ? ?Social history: ? ?Relevant past medical,  surgical, family and social history reviewed and updated as indicated. Interim medical history since our last visit reviewed. ? ?Allergies and medications reviewed and updated. ? ?DATA REVIEWED: CHART IN EPIC ? ?ROS: Negative unless specifically indicated above in HPI.  ? ? ?Current Outpatient Medications:  ?  amLODipine (NORVASC) 10 MG tablet, TAKE 1 TABLET BY MOUTH EVERY DAY, Disp: 90 tablet, Rfl: 0 ?  Apple Cider Vinegar 500 MG TABS, , Disp: , Rfl:  ?  aspirin 325 MG EC tablet, Take 325 mg by mouth daily., Disp: , Rfl:  ?  atorvastatin (LIPITOR) 80 MG tablet, TAKE 1 TABLET BY MOUTH EVERY DAY, Disp: 90 tablet, Rfl: 0 ?  Cholecalciferol (DIALYVITE VITAMIN D 5000) 125 MCG (5000 UT) capsule, Take 5,000 Units by mouth daily., Disp: , Rfl:  ?  CINNAMON PO, , Disp: , Rfl:  ?  Cyanocobalamin (B-12) 5000 MCG CAPS, Take 5,000 mcg by mouth daily., Disp: , Rfl:  ?  gabapentin (NEURONTIN) 300 MG capsule, Take 1 capsule (300 mg total) by mouth 2 (two) times daily., Disp: 180 capsule, Rfl: 1 ?  Garlic 101245G CAPS, Take 1,000 mg by mouth 2 (two) times daily., Disp: , Rfl:  ?  JARDIANCE 25 MG TABS tablet, TAKE 1 TABLET BY MOUTH DAILY BEFORE BREAKFAST., Disp: 30 tablet, Rfl: 2 ?  Krill Oil 500 MG CAPS, Take 500 mg by mouth daily., Disp: , Rfl:  ?  levothyroxine (SYNTHROID) 75 MCG tablet, TAKE 1 TABLET BY MOUTH EVERY DAY BEFORE BREAKFAST, Disp: 90 tablet, Rfl: 1 ?  lisinopril (ZESTRIL) 20 MG tablet, Take 1 tablet (20 mg total) by mouth daily., Disp: 90 tablet, Rfl: 0 ?  metFORMIN (GLUCOPHAGE) 500 MG tablet, TAKE 1 TABLET BY MOUTH 2 TIMES DAILY WITH A MEAL., Disp: 180 tablet, Rfl: 1 ?  Multiple Vitamin (MULTIVITAMIN ADULT PO), Take 1 tablet by mouth daily. , Disp: , Rfl:  ?  omeprazole (PRILOSEC) 20 MG capsule, Take 1 capsule (20 mg total) by mouth daily., Disp: 90 capsule, Rfl: 1 ?  PSYLLIUM HUSK PO, , Disp: , Rfl:  ?  spironolactone (ALDACTONE) 25 MG tablet, TAKE 1 TABLET (25 MG TOTAL) BY MOUTH DAILY., Disp: 90 tablet, Rfl: 1 ?   traMADol (ULTRAM) 50 MG tablet, Take 1 tablet (50 mg total) by mouth 2 (two) times daily as needed., Disp: 30 tablet, Rfl: 1 ?  TRULICITY 3 GS/3.9IC SOPN, SMARTSIG:3 Milligram(s) SUB-Q Once a Week, Disp: , R

## 2021-11-30 NOTE — Patient Instructions (Addendum)
Bonnita Nasuti ?Referral Coordinator ?Aldean Baker ?[phone]  ? ?[facility]  ?[address]  ?[phone] ? ?Gabapentin: ? 8 AM 2 PM Bedtime  ?After 3-5 days 1 tablet 0 1 tablet  ?After 3-5 days 1 tablet 1 tablet 1 tablet  ?After 3-5 days 1 tablet 1 tablet 2 tablets  ?After 3-5 days 2 tablets 1 tablet 2 tablets  ?After 3-5 days 2 tablets 2 tablets 2 tablets  ? ? ?

## 2021-12-01 ENCOUNTER — Encounter: Payer: Self-pay | Admitting: Family Medicine

## 2021-12-01 LAB — CBC WITH DIFFERENTIAL/PLATELET
Basophils Absolute: 0 10*3/uL (ref 0.0–0.2)
Basos: 1 %
EOS (ABSOLUTE): 0.2 10*3/uL (ref 0.0–0.4)
Eos: 4 %
Hematocrit: 43 % (ref 37.5–51.0)
Hemoglobin: 14.8 g/dL (ref 13.0–17.7)
Immature Grans (Abs): 0 10*3/uL (ref 0.0–0.1)
Immature Granulocytes: 0 %
Lymphocytes Absolute: 1.1 10*3/uL (ref 0.7–3.1)
Lymphs: 24 %
MCH: 31.6 pg (ref 26.6–33.0)
MCHC: 34.4 g/dL (ref 31.5–35.7)
MCV: 92 fL (ref 79–97)
Monocytes Absolute: 0.5 10*3/uL (ref 0.1–0.9)
Monocytes: 12 %
Neutrophils Absolute: 2.7 10*3/uL (ref 1.4–7.0)
Neutrophils: 59 %
Platelets: 255 10*3/uL (ref 150–450)
RBC: 4.68 x10E6/uL (ref 4.14–5.80)
RDW: 12.6 % (ref 11.6–15.4)
WBC: 4.5 10*3/uL (ref 3.4–10.8)

## 2021-12-01 LAB — TSH: TSH: 1.45 u[IU]/mL (ref 0.450–4.500)

## 2021-12-01 LAB — LIPID PANEL
Chol/HDL Ratio: 3.5 ratio (ref 0.0–5.0)
Cholesterol, Total: 152 mg/dL (ref 100–199)
HDL: 44 mg/dL (ref >39)
LDL Chol Calc (NIH): 86 mg/dL (ref 0–99)
Triglycerides: 125 mg/dL (ref 0–149)
VLDL Cholesterol Cal: 22 mg/dL (ref 5–40)

## 2021-12-01 LAB — CMP14+EGFR
ALT: 47 IU/L — ABNORMAL HIGH (ref 0–44)
AST: 36 IU/L (ref 0–40)
Albumin/Globulin Ratio: 2.6 — ABNORMAL HIGH (ref 1.2–2.2)
Albumin: 4.6 g/dL (ref 3.8–4.8)
Alkaline Phosphatase: 78 IU/L (ref 44–121)
BUN/Creatinine Ratio: 18 (ref 10–24)
BUN: 14 mg/dL (ref 8–27)
Bilirubin Total: 0.4 mg/dL (ref 0.0–1.2)
CO2: 24 mmol/L (ref 20–29)
Calcium: 9.6 mg/dL (ref 8.6–10.2)
Chloride: 99 mmol/L (ref 96–106)
Creatinine, Ser: 0.76 mg/dL (ref 0.76–1.27)
Globulin, Total: 1.8 g/dL (ref 1.5–4.5)
Glucose: 162 mg/dL — ABNORMAL HIGH (ref 70–99)
Potassium: 5 mmol/L (ref 3.5–5.2)
Sodium: 138 mmol/L (ref 134–144)
Total Protein: 6.4 g/dL (ref 6.0–8.5)
eGFR: 102 mL/min/{1.73_m2} (ref >59)

## 2021-12-01 LAB — MICROALBUMIN / CREATININE URINE RATIO
Creatinine, Urine: 46.7 mg/dL
Microalb/Creat Ratio: <6 mg/g creat (ref 0–29)
Microalbumin, Urine: <3 ug/mL

## 2021-12-01 LAB — T4, FREE: Free T4: 1.28 ng/dL (ref 0.82–1.77)

## 2021-12-01 LAB — VITAMIN B12: Vitamin B-12: >2000 pg/mL — ABNORMAL HIGH (ref 232–1245)

## 2021-12-05 NOTE — Addendum Note (Signed)
Addended by: Gwenlyn Fudge on: 12/05/2021 09:54 AM ? ? Modules accepted: Orders ? ?

## 2021-12-05 NOTE — Telephone Encounter (Signed)
Referral placed to PT

## 2021-12-14 ENCOUNTER — Telehealth: Payer: Self-pay | Admitting: *Deleted

## 2021-12-14 DIAGNOSIS — G8929 Other chronic pain: Secondary | ICD-10-CM

## 2021-12-14 DIAGNOSIS — M5416 Radiculopathy, lumbar region: Secondary | ICD-10-CM

## 2021-12-14 NOTE — Telephone Encounter (Signed)
Key: [id number] ?traMADol HCl 50MG  tablets ?Sent to plan  ? ? ?Additional Information Required ? Rx Prior Authorization Team is unable to review this request for prior authorization as the requested medication is on formulary and does not require a prior authorization. No further action is needed at this time. ?

## 2021-12-16 ENCOUNTER — Ambulatory Visit (INDEPENDENT_AMBULATORY_CARE_PROVIDER_SITE_OTHER): Payer: 59 | Admitting: Orthopaedic Surgery

## 2021-12-16 ENCOUNTER — Encounter: Payer: Self-pay | Admitting: Orthopaedic Surgery

## 2021-12-16 ENCOUNTER — Encounter: Payer: Self-pay | Admitting: Family Medicine

## 2021-12-16 DIAGNOSIS — M5416 Radiculopathy, lumbar region: Secondary | ICD-10-CM

## 2021-12-16 DIAGNOSIS — M7061 Trochanteric bursitis, right hip: Secondary | ICD-10-CM

## 2021-12-16 DIAGNOSIS — G8929 Other chronic pain: Secondary | ICD-10-CM

## 2021-12-16 NOTE — Progress Notes (Signed)
? ?Office Visit Note ?  ?Patient: Daniel Scott           ?Date of Birth: Apr 05, 1961           ?MRN: [id number] ?Visit Date: 12/16/2021 ?             ?Requested by: Gwenlyn Fudge, FNP ?[address] [place],  [place] [address] ?PCP: Gwenlyn Fudge, FNP ? ? ?Assessment & Plan: ?Visit Diagnoses:  ?1. Trochanteric bursitis, right hip   ? ? 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  Moderate impingement L5-S1 mild to moderate at 3 4 and mild at 4 5.  Does have some increased signal activity blood left kidney most likely renal cyst.  Trochanteric injection performed today which gave him improvement in his gait.  Trochanteric bursitis is likely secondary.  We discussed options for referral to [facility] bariatric team to see if he needs some adjustment to his Lap-Band since the lost weight then gained weight and [place].  Possible referral to weight loss clinic also with some of the new medications that might be helpful.  He can follow-up with me if he has ongoing problems but certainly needs to work on some weight loss to help with his diabetes hypertension hyperlipidemia as well as his back pain. ? ?Follow-Up Instructions: No follow-ups on file.  ? ?Orders:  ?Orders Placed This Encounter  ?Procedures  ? Large Joint Inj  ? ?No orders of the defined types were placed in this encounter. ? ? ? ? Procedures: ?Large Joint Inj: R greater trochanter on 12/19/2021 11:27 AM ?Details: lateral approach ?Medications: 1 mL lidocaine 1 %; 2 mL bupivacaine 0.25 %; 40 mg methylPREDNISolone acetate 40 MG/ML ? ? ? ? ?Clinical Data: ?No additional findings. ? ? ?Subjective: ?Chief Complaint  ?Patient presents with  ? Lower Back - Pain  ? ? ?HPI 61 year old male new patient visit with back pain and right groin pain for greater than a year.  Patient states he had a hernia ruled out there is concern he might have some bursitis in his hip.  1 year ago  back pain started.  Last 3 months he states the pain that goes down his right leg stops just above the knee.  Rarely does that she is below the knee.  He is used tramadol twice a day gabapentin 600 mg 3 times daily.  He describes pain as stabbing when he walks.  He points laterally over the trochanter where he has some pain.  None on the left side. ? ?Patient initially has problems with the hypertension morbid obesity hyperlipidemia type 2 diabetes with hyperglycemia long-term use of insulin.  Previous cervical discectomy lap band surgery with weight loss and then some partial weight gain. ? ?Review of Systems all the systems noncontributory to HPI. ? ? ?Objective: ?Vital Signs: Ht 5\' 10"  (1.778 m)   Wt (!) 340 lb (154.2 kg)   BMI 48.78 kg/m?  ? ?Physical Exam ?Constitutional:   ?   Appearance: He is well-developed.  ?HENT:  ?   Head: Normocephalic and atraumatic.  ?   Right Ear: External ear normal.  ?   Left Ear: External ear normal.  ?Eyes:  ?   Pupils: Pupils are equal, round, and reactive to light.  ?Neck:  ?   Thyroid: No thyromegaly.  ?   Trachea: No tracheal deviation.  ?Cardiovascular:  ?   Rate and Rhythm: Normal  rate.  ?Pulmonary:  ?   Effort: Pulmonary effort is normal.  ?   Breath sounds: No wheezing.  ?Abdominal:  ?   General: Bowel sounds are normal.  ?   Palpations: Abdomen is soft.  ?Musculoskeletal:  ?   Cervical back: Neck supple.  ?Skin: ?   General: Skin is warm and dry.  ?   Capillary Refill: Capillary refill takes less than 2 seconds.  ?Neurological:  ?   Mental Status: He is alert and oriented to person, place, and time.  ?Psychiatric:     ?   Behavior: Behavior normal.     ?   Thought Content: Thought content normal.     ?   Judgment: Judgment normal.  ? ? ?Ortho Exam patient has no tenderness of the left trochanter mild sciatic notch tenderness negative straight leg raising 90 degrees.  No pain with internal rotation of the hip but exquisite tenderness over the right greater trochanter.   Knee exam is normal. ? ?Specialty Comments:  ?No specialty comments available. ? ?Imaging: ?Narrative & Impression  ?CLINICAL DATA:  Low back pain radiating down into both legs for 1 ?year ?  ?EXAM: ?MRI LUMBAR SPINE WITHOUT CONTRAST ?  ?TECHNIQUE: ?Multiplanar, multisequence MR imaging of the lumbar spine was ?performed. No intravenous contrast was administered. ?  ?COMPARISON:  Radiographs 10/26/2021 ?  ?FINDINGS: ?Segmentation: There is a partially segmental S1 vertebra but the ?lowest fully segmental non rib-bearing vertebra is labeled as L5. ?  ?Alignment:  No vertebral subluxation is observed. ?  ?Vertebrae: Disc desiccation at L3-4 and L5-S1 with loss of disc ?height especially at L5-S1. Rudimentary disc material at the S1-2 ?level. Mild degenerative endplate findings at L3-4. ?  ?Conus medullaris and cauda equina: Conus extends to the L1 level. ?Conus and cauda equina appear normal. ?  ?Paraspinal and other soft tissues: Fluid signal intensity structure ?below the left kidney probably represents a large renal cyst but is ?only partially included on today's exam. ?  ?Disc levels: ?  ?L1-2: No impingement. Small right paracentral disc protrusion. This ?level is only included on the parasagittal images. ?  ?L2-3: Unremarkable ?  ?L3-4: Mild to moderate central narrowing of the thecal sac and mild ?left foraminal stenosis with mild left subarticular lateral recess ?stenosis and mild deviation of the left L3 nerve in the lateral ?extraforaminal space due disc bulge, left lateral recess disc ?protrusion, intervertebral spurring, and short pedicles. ?  ?L4-5: Mild bilateral foraminal stenosis and mild central narrowing ?of the thecal sac due to disc bulge and short pedicles. ?  ?L5-S1: Moderate left and mild right foraminal stenosis with mild ?bilateral subarticular lateral recess stenosis due to disc bulge, ?central disc protrusion, intervertebral spurring, and degenerative ?facet arthropathy. ?  ?S1-2: No  impingement. ?  ?IMPRESSION: ?1. Please note that there is a partially segmental S1 vertebra but ?the lowest fully segmental non-rib-bearing vertebra is labeled L5. ?2. 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 ?3. Large fluid signal intensity lesion below the left kidney is ?partially included on today's examination and is most likely to be a ?left renal cyst, but not fully characterized. ?  ?  ?Electronically Signed ?  By: Gaylyn RongWalter  Liebkemann M.D. ?  On: 11/17/2021 12:58 ?   ? ? ? ?PMFS History: ?Patient Active Problem List  ? Diagnosis Date Noted  ? Trochanteric bursitis, right hip 12/19/2021  ?  Aortic atherosclerosis (HCC) 08/03/2021  ? Acquired hypothyroidism 08/03/2021  ? DDD (degenerative disc disease), cervical 02/18/2021  ? Essential hypertension 06/13/2020  ? Type 2 diabetes mellitus with hyperglycemia, without long-term current use of insulin (HCC) 03/10/2020  ? Morbid obesity (HCC) 03/10/2020  ? Hyperlipidemia   ? GERD (gastroesophageal reflux disease)   ? ?Past Medical History:  ?Diagnosis Date  ? Arthritis   ? hands and feet  ? Depression   ? Diabetes mellitus without complication (HCC)   ? type 2  ? Fatty liver   ? GERD (gastroesophageal reflux disease)   ? Hiatal hernia   ? Hyperlipidemia   ? Hypertension   ? Hypothyroidism   ? Neuromuscular disorder (HCC)   ? stenosis  ? Pneumonia 1990  ? Stenosis of cervical spine   ? Thyroid disease   ?  ?Family History  ?Problem Relation Age of Onset  ? Vascular Disease Mother   ? Cancer Mother   ?     Bone marrow  ? Kidney disease Mother   ? Thyroid disease Mother   ? Hypertension Mother   ? Diabetes Mother   ? Coronary artery disease Father   ? Colon cancer Father   ? Heart disease Father   ? Hyperlipidemia Father   ? Hypertension Father   ? Spina bifida Daughter   ? Depression Son   ? Anxiety disorder Son   ?  Hypertension Son   ? Breast cancer Maternal Grandmother   ? Hypertension Maternal Grandmother   ? Stroke Maternal Grandmother   ? Lung cancer Maternal Grandfather   ? Diabetes Maternal Grandfather   ? Heart disea

## 2021-12-19 DIAGNOSIS — M7061 Trochanteric bursitis, right hip: Secondary | ICD-10-CM | POA: Diagnosis not present

## 2021-12-19 MED ORDER — METHYLPREDNISOLONE ACETATE 40 MG/ML IJ SUSP
40.0000 mg | INTRAMUSCULAR | Status: AC | PRN
  Administered 2021-12-19: 40 mg via INTRA_ARTICULAR

## 2021-12-19 MED ORDER — BUPIVACAINE HCL 0.25 % IJ SOLN
2.0000 mL | INTRAMUSCULAR | Status: AC | PRN
  Administered 2021-12-19: 2 mL via INTRA_ARTICULAR

## 2021-12-19 MED ORDER — LIDOCAINE HCL 1 % IJ SOLN
1.0000 mL | INTRAMUSCULAR | Status: AC | PRN
  Administered 2021-12-19: 1 mL

## 2021-12-27 MED ORDER — GABAPENTIN 300 MG PO CAPS: 600.0000 mg | ORAL_CAPSULE | Freq: Two times a day (BID) | ORAL | 1 refills | Status: DC

## 2022-01-03 MED ORDER — GABAPENTIN 600 MG PO TABS: 600.0000 mg | ORAL_TABLET | Freq: Two times a day (BID) | ORAL | 2 refills | Status: DC

## 2022-01-03 NOTE — Addendum Note (Signed)
Addended by: [name] F on: 01/03/2022 11:01 AM ? ? Modules accepted: Orders ? ?

## 2022-01-12 ENCOUNTER — Encounter: Payer: Self-pay | Admitting: Family Medicine

## 2022-01-15 ENCOUNTER — Other Ambulatory Visit: Payer: Self-pay | Admitting: Family Medicine

## 2022-01-15 DIAGNOSIS — Z794 Long term (current) use of insulin: Secondary | ICD-10-CM

## 2022-01-15 DIAGNOSIS — E1165 Type 2 diabetes mellitus with hyperglycemia: Secondary | ICD-10-CM

## 2022-01-26 ENCOUNTER — Other Ambulatory Visit: Payer: Self-pay | Admitting: Family Medicine

## 2022-01-26 DIAGNOSIS — E1165 Type 2 diabetes mellitus with hyperglycemia: Secondary | ICD-10-CM

## 2022-01-27 ENCOUNTER — Other Ambulatory Visit: Payer: Self-pay | Admitting: Family Medicine

## 2022-01-27 DIAGNOSIS — Z79899 Other long term (current) drug therapy: Secondary | ICD-10-CM

## 2022-01-27 DIAGNOSIS — G8929 Other chronic pain: Secondary | ICD-10-CM

## 2022-02-01 ENCOUNTER — Encounter: Payer: Self-pay | Admitting: Family Medicine

## 2022-02-01 ENCOUNTER — Ambulatory Visit: Payer: 59 | Admitting: Family Medicine

## 2022-02-14 ENCOUNTER — Other Ambulatory Visit: Payer: Self-pay | Admitting: Family Medicine

## 2022-02-14 DIAGNOSIS — Z794 Long term (current) use of insulin: Secondary | ICD-10-CM

## 2022-02-14 DIAGNOSIS — I10 Essential (primary) hypertension: Secondary | ICD-10-CM

## 2022-02-22 ENCOUNTER — Encounter: Payer: Self-pay | Admitting: Family Medicine

## 2022-02-22 DIAGNOSIS — M5416 Radiculopathy, lumbar region: Secondary | ICD-10-CM

## 2022-02-22 DIAGNOSIS — G8929 Other chronic pain: Secondary | ICD-10-CM

## 2022-02-24 ENCOUNTER — Encounter: Payer: Self-pay | Admitting: Family Medicine

## 2022-03-02 ENCOUNTER — Encounter: Payer: Self-pay | Admitting: Family Medicine

## 2022-03-02 ENCOUNTER — Ambulatory Visit (INDEPENDENT_AMBULATORY_CARE_PROVIDER_SITE_OTHER): Payer: 59 | Admitting: Family Medicine

## 2022-03-02 VITALS — BP 139/87 | HR 96 | Temp 97.6°F | Ht 70.0 in | Wt 339.0 lb

## 2022-03-02 DIAGNOSIS — G8929 Other chronic pain: Secondary | ICD-10-CM

## 2022-03-02 DIAGNOSIS — E1165 Type 2 diabetes mellitus with hyperglycemia: Secondary | ICD-10-CM | POA: Diagnosis not present

## 2022-03-02 DIAGNOSIS — Z79899 Other long term (current) drug therapy: Secondary | ICD-10-CM

## 2022-03-02 DIAGNOSIS — M5441 Lumbago with sciatica, right side: Secondary | ICD-10-CM

## 2022-03-02 DIAGNOSIS — E039 Hypothyroidism, unspecified: Secondary | ICD-10-CM

## 2022-03-02 DIAGNOSIS — I10 Essential (primary) hypertension: Secondary | ICD-10-CM

## 2022-03-02 DIAGNOSIS — I700 Atherosclerosis of aorta: Secondary | ICD-10-CM

## 2022-03-02 DIAGNOSIS — E782 Mixed hyperlipidemia: Secondary | ICD-10-CM | POA: Diagnosis not present

## 2022-03-02 DIAGNOSIS — M5442 Lumbago with sciatica, left side: Secondary | ICD-10-CM

## 2022-03-02 DIAGNOSIS — N529 Male erectile dysfunction, unspecified: Secondary | ICD-10-CM | POA: Insufficient documentation

## 2022-03-02 DIAGNOSIS — K219 Gastro-esophageal reflux disease without esophagitis: Secondary | ICD-10-CM

## 2022-03-02 DIAGNOSIS — M5416 Radiculopathy, lumbar region: Secondary | ICD-10-CM | POA: Insufficient documentation

## 2022-03-02 LAB — BAYER DCA HB A1C WAIVED: HB A1C (BAYER DCA - WAIVED): 7.1 % — ABNORMAL HIGH (ref 4.8–5.6)

## 2022-03-02 MED ORDER — SILDENAFIL CITRATE 100 MG PO TABS: 100.0000 mg | ORAL_TABLET | Freq: Every day | ORAL | 2 refills | Status: DC | PRN

## 2022-03-02 MED ORDER — HYDROCODONE-ACETAMINOPHEN 5-325 MG PO TABS: 1.0000 | ORAL_TABLET | Freq: Four times a day (QID) | ORAL | 0 refills | Status: DC | PRN

## 2022-03-02 MED ORDER — ATORVASTATIN CALCIUM 80 MG PO TABS: 80.0000 mg | ORAL_TABLET | Freq: Every day | ORAL | 1 refills | Status: DC

## 2022-03-02 MED ORDER — GABAPENTIN 600 MG PO TABS: 600.0000 mg | ORAL_TABLET | Freq: Three times a day (TID) | ORAL | 1 refills | Status: DC

## 2022-03-02 MED ORDER — EMPAGLIFLOZIN 25 MG PO TABS: ORAL_TABLET | ORAL | 1 refills | Status: DC

## 2022-03-02 MED ORDER — AMLODIPINE BESYLATE 10 MG PO TABS: 10.0000 mg | ORAL_TABLET | Freq: Every day | ORAL | 1 refills | Status: DC

## 2022-03-02 MED ORDER — SEMAGLUTIDE (2 MG/DOSE) 8 MG/3ML ~~LOC~~ SOPN: 2.0000 mg | PEN_INJECTOR | SUBCUTANEOUS | 1 refills | Status: DC

## 2022-03-02 MED ORDER — LEVOTHYROXINE SODIUM 75 MCG PO TABS: ORAL_TABLET | ORAL | 1 refills | Status: DC

## 2022-03-02 NOTE — Progress Notes (Signed)
Assessment & Plan:  Aortic atherosclerosis (HCC) Continue aspirin and atorvastatin.  Essential hypertension Well controlled on current regimen.   GERD (gastroesophageal reflux disease) Well controlled on current regimen.   Acquired hypothyroidism Well controlled on current regimen.   Type 2 diabetes mellitus with hyperglycemia, without long-term current use of insulin (HCC) Lab Results  Component Value Date   HGBA1C 8.0 (H) 11/30/2021   HGBA1C 6.8 (H) 08/03/2021   HGBA1C 7.1 (H) 03/25/2021  A1c 7.1 today which has decreased from 8.0 three months ago. - Diabetes is not at goal of A1c < 7. - Medications: continue current medications, with an increase in Ozempic from 1 mg to 2 mg weekly - Home glucose monitoring: not monitoring - Patient is currently taking a statin. Patient is taking an ACE-inhibitor/ARB.  - Instruction/counseling given: discussed the need for weight loss  Diabetes Health Maintenance Due  Topic Date Due   FOOT EXAM  03/25/2022   OPHTHALMOLOGY EXAM  05/28/2022   HEMOGLOBIN A1C  06/02/2022    Lab Results  Component Value Date   LABMICR <3.0 11/30/2021   LABMICR See below: 08/25/2020    Hyperlipidemia Well controlled on current regimen.   Morbid obesity ([place]) Patient has lost 2 lbs over the past three months. Encouraged healthy eating and exercise. Increasing Ozempic dosage from 1 mg to 2 mg weekly to aid in weight loss.   Chronic midline low back pain with bilateral sciatica Uncontrolled. D/C Tramadol. Start Norco. Patient is waiting to hear from referral to neurosurgery but thinks this will not be handled until after he has new insurance next month. Controlled substance agreement in place. Urine drug screen as expected. PDMP reviewed with no concerning findings.   Lumbar nerve root impingement Uncontrolled. Increase gabapentin from 600 mg BID to TID.  Erectile dysfunction New start sildenafil 100 mg daily as needed. Patient to get from [facility]  using GoodRx discount.    Return in about 3 months (around 06/02/2022) for follow-up of chronic medication conditions.  Hendricks Limes, MSN, APRN, FNP-C [facility]  Subjective:    Patient ID: Daniel Scott, male    DOB: 1960/09/20, 61 y.o.   MRN: [id number]  Patient Care Team: Loman Brooklyn, FNP as PCP - General (Family Medicine) Okey Regal, OD (Optometry)   Chief Complaint:  Chief Complaint  Patient presents with   Medical Management of Chronic Issues   chronic midline lower back pain with bilateral sciatica    Ongoing but has gotten worse. Patient states that the pain is so bad some days he feels like not getting out of bed.     HPI: Daniel Scott is a 61 y.o. male presenting on 03/02/2022 for Medical Management of Chronic Issues and chronic midline lower back pain with bilateral sciatica (Ongoing but has gotten worse. Patient states that the pain is so bad some days he feels like not getting out of bed. )  Diabetes: Patient presents for follow up of diabetes. Current symptoms include: none. Known diabetic complications: none. Medication compliance: yes. Current diet:  quit drinking soda; decreased carbs . Current exercise: yard work. He is not checking his blood sugar at home. Is he  on ACE inhibitor or angiotensin II receptor blocker? Yes (Lisinopril). Is he on a statin? Yes (Atorvastatin).   Hyperlipidemia: taking atorvastatin.  Aortic Atherosclerosis: taking aspirin and atorvastatin.   GERD: taking omeprazole daily.   Pain assessment: Cause of pain- degenerative changes   Lumbar spine x-ray on 10/26/2021: Diffuse multilevel  degenerative change with multilevel disc degeneration, endplate osteophyte formation and facet hypertrophy.  MRI lumbar spine 11/19/2021: Lumbar spondylosis, degenerative disc disease, and congenitally short pedicles contributing to moderate impingement at L5-S1; mild to moderate impingement at L3-4; and mild impingement at  L4-5.   Pain location- low back Pain on scale of 1-10- 4/10 on average; sometimes up to 8-9/10 Frequency- daily What increases pain- walking, bending What makes pain better- sitting, resting Effects on ADL- makes difficult at times Any change in general medical condition- none   Current opioids rx- Tramadol 50 mg BID PRN # meds rx- 30 He is also taking gabapentin 600 mg twice daily which he feels is maybe a little helpful for the pain. Previously failed treatment with Ibuprofen and Tizanidine. Effectiveness of current meds- not helpful so he quit taking the Tramadol Adverse reactions from pain meds- none Morphine equivalent- 10 MME/day  Pill count performed-No Last drug screen - 11/02/2021 ( high risk q65m moderate risk [id number] risk yearly ) Urine drug screen today- No Was the [place]- Yes  If yes were their any concerning findings? - No  Overdose risk: 140     11/02/2021    8:58 AM  Opioid Risk   Alcohol 0  Illegal Drugs 0  Rx Drugs 0  Alcohol 0  Illegal Drugs 0  Rx Drugs 0  Age between 16-45 years  0  History of Preadolescent Sexual Abuse 0  Psychological Disease 0  Depression 0  Opioid Risk Tool Scoring 0  Opioid Risk Interpretation Low Risk   Pain contract signed on: 11/02/2021    New complaints: Patient reports his pain is so bad that he has found several times recently that he doesn't even want to get out of the bed because he knows how much pain he is going to experience. He feels his pain is causing depression symptoms even though he is not depressed. He also reports he found out this morning that his insurance is going to have to change next week.      03/02/2022    8:07 AM 11/30/2021    8:15 AM 11/02/2021    8:38 AM  Depression screen PHQ 2/9  Decreased Interest _0 Down, Depressed, Hopeless 1 0 0  PHQ - 2 Score _1 Altered sleeping _2 Tired, decreased energy _3 Change in appetite 0 1 0  Feeling bad or failure about yourself  1 0  0  Trouble concentrating 0 0 0  Moving slowly or fidgety/restless 0 0 0  Suicidal thoughts 0 0 0  PHQ-9 Score _4 Difficult doing work/chores Somewhat difficult Somewhat difficult Not difficult at all    He is requesting sildenafil 100 mg as needed. He is currently taking 50 mg that he got offline.   Social history:  Relevant past medical, surgical, family and social history reviewed and updated as indicated. Interim medical history since our last visit reviewed.  Allergies and medications reviewed and updated.  DATA REVIEWED: CHART IN EPIC  ROS: Negative unless specifically indicated above in HPI.    Current Outpatient Medications:    amLODipine (NORVASC) 10 MG tablet, TAKE 1 TABLET BY MOUTH EVERY DAY, Disp: 90 tablet, Rfl: 0   Apple Cider Vinegar 500 MG TABS, , Disp: , Rfl:    aspirin 325 MG EC tablet, Take 325 mg by mouth daily., Disp: , Rfl:    atorvastatin (LIPITOR) 80 MG tablet, TAKE 1 TABLET  BY MOUTH EVERY DAY, Disp: 90 tablet, Rfl: 0   Cholecalciferol (DIALYVITE VITAMIN D 5000) 125 MCG (5000 UT) capsule, Take 5,000 Units by mouth daily., Disp: , Rfl:    CINNAMON PO, , Disp: , Rfl:    Cyanocobalamin (B-12) 5000 MCG CAPS, Take 5,000 mcg by mouth daily., Disp: , Rfl:    gabapentin (NEURONTIN) 600 MG tablet, Take 1 tablet (600 mg total) by mouth 2 (two) times daily., Disp: 60 tablet, Rfl: 2   Garlic 4268 MG CAPS, Take 1,000 mg by mouth 2 (two) times daily., Disp: , Rfl:    JARDIANCE 25 MG TABS tablet, TAKE 1 TABLET BY MOUTH EVERY DAY BEFORE BREAKFAST, Disp: 30 tablet, Rfl: 1   Krill Oil 500 MG CAPS, Take 500 mg by mouth daily., Disp: , Rfl:    levothyroxine (SYNTHROID) 75 MCG tablet, TAKE 1 TABLET BY MOUTH EVERY DAY BEFORE BREAKFAST, Disp: 90 tablet, Rfl: 1   lisinopril (ZESTRIL) 20 MG tablet, Take 1 tablet (20 mg total) by mouth daily., Disp: 90 tablet, Rfl: 1   metFORMIN (GLUCOPHAGE) 500 MG tablet, TAKE 1 TABLET BY MOUTH 2 TIMES DAILY WITH A MEAL., Disp: 180 tablet, Rfl:  0   Multiple Vitamin (MULTIVITAMIN ADULT PO), Take 1 tablet by mouth daily. , Disp: , Rfl:    omeprazole (PRILOSEC) 20 MG capsule, Take 1 capsule (20 mg total) by mouth daily., Disp: 90 capsule, Rfl: 1   PSYLLIUM HUSK PO, , Disp: , Rfl:    Semaglutide, 1 MG/DOSE, (OZEMPIC, 1 MG/DOSE,) 4 MG/3ML SOPN, Inject 1 mg into the skin once a week., Disp: 6 mL, Rfl: 0   spironolactone (ALDACTONE) 25 MG tablet, TAKE 1 TABLET (25 MG TOTAL) BY MOUTH DAILY., Disp: 90 tablet, Rfl: 0   Allergies  Allergen Reactions   Sulfa Antibiotics    Past Medical History:  Diagnosis Date   Arthritis    hands and feet   Depression    Diabetes mellitus without complication ([place])    type 2   Fatty liver    GERD (gastroesophageal reflux disease)    Hiatal hernia    Hyperlipidemia    Hypertension    Hypothyroidism    Neuromuscular disorder ([place])    stenosis   Pneumonia 1990   Stenosis of cervical spine    Thyroid disease     Past Surgical History:  Procedure Laterality Date   ANTERIOR CERVICAL DECOMP/DISCECTOMY FUSION N/A 06/02/2020   Procedure: ANTERIOR CERVICAL DECOMPRESSION/DISCECTOMY FUSIONCERVICAL FOUR- CERVICAL FIVE, CERVICAL FIVE- CERVICAL SIX;  Surgeon: Dawley, Theodoro Doing, DO;  Location: [place];  Service: Neurosurgery;  Laterality: N/A;  ANTERIOR CERVICAL DECOMPRESSION/DISCECTOMY FUSIONCERVICAL FOUR- CERVICAL FIVE, CERVICAL FIVE- CERVICAL SIX   CARDIAC CATHETERIZATION  1996; 2006   [facility]   HYDROCELE EXCISION     LAPAROSCOPIC GASTRIC BANDING     LAPAROSCOPIC ROUX-EN-Y GASTRIC BYPASS WITH UPPER ENDOSCOPY AND REMOVAL OF LAP BAND  02/2012   TONSILLECTOMY      Social History   Socioeconomic History   Marital status: Married    Spouse name: Not on file   Number of children: Not on file   Years of education: Not on file   Highest education level: Not on file  Occupational History   Not on file  Tobacco Use   Smoking status: Former    Types: Cigarettes    Quit date: 05/2009     Years since quitting: 12.8   Smokeless tobacco: Never  Vaping Use   Vaping Use: Never used  Substance and Sexual Activity   Alcohol use: Yes    Comment: daily   Drug use: Yes    Types: Marijuana    Comment: occ   Sexual activity: Yes    Birth control/protection: None  Other Topics Concern   Not on file  Social History Narrative   Not on file   Social Determinants of Health   Financial Resource Strain: Not on file  Food Insecurity: Not on file  Transportation Needs: Not on file  Physical Activity: Not on file  Stress: Not on file  Social Connections: Not on file  Intimate Partner Violence: Not on file        Objective:    BP 139/87   Pulse 96   Temp 97.6 F (36.4 C) (Temporal)   Ht 5' 10" (1.778 m)   Wt (!) 339 lb (153.8 kg)   SpO2 94%   BMI 48.64 kg/m   Wt Readings from Last 3 Encounters:  03/02/22 (!) 339 lb (153.8 kg)  12/16/21 (!) 340 lb (154.2 kg)  11/30/21 (!) 341 lb (154.7 kg)    Physical Exam Vitals reviewed.  Constitutional:      General: He is not in acute distress.    Appearance: Normal appearance. He is morbidly obese. He is not ill-appearing, toxic-appearing or diaphoretic.  HENT:     Head: Normocephalic and atraumatic.  Eyes:     General: No scleral icterus.       Right eye: No discharge.        Left eye: No discharge.     Conjunctiva/sclera: Conjunctivae normal.  Cardiovascular:     Rate and Rhythm: Normal rate and regular rhythm.     Heart sounds: Normal heart sounds. No murmur heard.    No friction rub. No gallop.  Pulmonary:     Effort: Pulmonary effort is normal. No respiratory distress.     Breath sounds: Normal breath sounds. No stridor. No wheezing, rhonchi or rales.  Musculoskeletal:        General: Normal range of motion.     Cervical back: Normal range of motion.  Skin:    General: Skin is warm and dry.  Neurological:     Mental Status: He is alert and oriented to person, place, and time. Mental status is at baseline.   Psychiatric:        Mood and Affect: Mood normal.        Behavior: Behavior normal.        Thought Content: Thought content normal.        Judgment: Judgment normal.     Lab Results  Component Value Date   TSH 1.450 11/30/2021   Lab Results  Component Value Date   WBC 4.5 11/30/2021   HGB 14.8 11/30/2021   HCT 43.0 11/30/2021   MCV 92 11/30/2021   PLT 255 11/30/2021   Lab Results  Component Value Date   NA 138 11/30/2021   K 5.0 11/30/2021   CO2 24 11/30/2021   GLUCOSE 162 (H) 11/30/2021   BUN 14 11/30/2021   CREATININE 0.76 11/30/2021   BILITOT 0.4 11/30/2021   ALKPHOS 78 11/30/2021   AST 36 11/30/2021   ALT 47 (H) 11/30/2021   PROT 6.4 11/30/2021   ALBUMIN 4.6 11/30/2021   CALCIUM 9.6 11/30/2021   ANIONGAP 12 11/03/2020   EGFR 102 11/30/2021   Lab Results  Component Value Date   CHOL 152 11/30/2021   Lab Results  Component Value Date   HDL 44 11/30/2021  Lab Results  Component Value Date   LDLCALC 86 11/30/2021   Lab Results  Component Value Date   TRIG 125 11/30/2021   Lab Results  Component Value Date   CHOLHDL 3.5 11/30/2021   Lab Results  Component Value Date   HGBA1C 8.0 (H) 11/30/2021

## 2022-03-02 NOTE — Assessment & Plan Note (Signed)
Lab Results  Component Value Date   HGBA1C 8.0 (H) 11/30/2021   HGBA1C 6.8 (H) 08/03/2021   HGBA1C 7.1 (H) 03/25/2021  A1c 7.1 today which has decreased from 8.0 three months ago. - Diabetes is not at goal of A1c < 7. - Medications: continue current medications, with an increase in Ozempic from 1 mg to 2 mg weekly - Home glucose monitoring: not monitoring - Patient is currently taking a statin. Patient is taking an ACE-inhibitor/ARB.  - Instruction/counseling given: discussed the need for weight loss  Diabetes Health Maintenance Due  Topic Date Due  . FOOT EXAM  03/25/2022  . OPHTHALMOLOGY EXAM  05/28/2022  . HEMOGLOBIN A1C  06/02/2022    Lab Results  Component Value Date   LABMICR <3.0 11/30/2021   LABMICR See below: 08/25/2020

## 2022-03-02 NOTE — Assessment & Plan Note (Signed)
Uncontrolled. Increase gabapentin from 600 mg BID to TID.

## 2022-03-02 NOTE — Assessment & Plan Note (Signed)
Uncontrolled. D/C Tramadol. Start Norco. Patient is waiting to hear from referral to neurosurgery but thinks this will not be handled until after he has new insurance next month. Controlled substance agreement in place. Urine drug screen as expected. PDMP reviewed with no concerning findings.

## 2022-03-02 NOTE — Assessment & Plan Note (Signed)
New start sildenafil 100 mg daily as needed. Patient to get from [facility] using [facility] discount.

## 2022-03-02 NOTE — Assessment & Plan Note (Signed)
Well-controlled on current regimen. ?

## 2022-03-02 NOTE — Assessment & Plan Note (Signed)
Continue aspirin and atorvastatin. ?

## 2022-03-02 NOTE — Assessment & Plan Note (Addendum)
Patient has lost 2 lbs over the past three months. Encouraged healthy eating and exercise. Increasing Ozempic dosage from 1 mg to 2 mg weekly to aid in weight loss.

## 2022-03-03 LAB — LIPID PANEL
Chol/HDL Ratio: 3.1 ratio (ref 0.0–5.0)
Cholesterol, Total: 121 mg/dL (ref 100–199)
HDL: 39 mg/dL — ABNORMAL LOW (ref >39)
LDL Chol Calc (NIH): 61 mg/dL (ref 0–99)
Triglycerides: 114 mg/dL (ref 0–149)
VLDL Cholesterol Cal: 21 mg/dL (ref 5–40)

## 2022-03-03 LAB — CMP14+EGFR
ALT: 47 IU/L — ABNORMAL HIGH (ref 0–44)
AST: 29 IU/L (ref 0–40)
Albumin/Globulin Ratio: 2.4 — ABNORMAL HIGH (ref 1.2–2.2)
Albumin: 4.7 g/dL (ref 3.8–4.8)
Alkaline Phosphatase: 77 IU/L (ref 44–121)
BUN/Creatinine Ratio: 20 (ref 10–24)
BUN: 16 mg/dL (ref 8–27)
Bilirubin Total: 0.3 mg/dL (ref 0.0–1.2)
CO2: 21 mmol/L (ref 20–29)
Calcium: 9.9 mg/dL (ref 8.6–10.2)
Chloride: 101 mmol/L (ref 96–106)
Creatinine, Ser: 0.82 mg/dL (ref 0.76–1.27)
Globulin, Total: 2 g/dL (ref 1.5–4.5)
Glucose: 142 mg/dL — ABNORMAL HIGH (ref 70–99)
Potassium: 5 mmol/L (ref 3.5–5.2)
Sodium: 139 mmol/L (ref 134–144)
Total Protein: 6.7 g/dL (ref 6.0–8.5)
eGFR: 99 mL/min/{1.73_m2} (ref >59)

## 2022-03-03 LAB — CBC WITH DIFFERENTIAL/PLATELET
Basophils Absolute: 0 10*3/uL (ref 0.0–0.2)
Basos: 1 %
EOS (ABSOLUTE): 0.2 10*3/uL (ref 0.0–0.4)
Eos: 3 %
Hematocrit: 44.5 % (ref 37.5–51.0)
Hemoglobin: 15 g/dL (ref 13.0–17.7)
Immature Grans (Abs): 0 10*3/uL (ref 0.0–0.1)
Immature Granulocytes: 0 %
Lymphocytes Absolute: 1.3 10*3/uL (ref 0.7–3.1)
Lymphs: 20 %
MCH: 31.4 pg (ref 26.6–33.0)
MCHC: 33.7 g/dL (ref 31.5–35.7)
MCV: 93 fL (ref 79–97)
Monocytes Absolute: 0.6 10*3/uL (ref 0.1–0.9)
Monocytes: 10 %
Neutrophils Absolute: 4 10*3/uL (ref 1.4–7.0)
Neutrophils: 66 %
Platelets: 265 10*3/uL (ref 150–450)
RBC: 4.78 x10E6/uL (ref 4.14–5.80)
RDW: 12.7 % (ref 11.6–15.4)
WBC: 6.2 10*3/uL (ref 3.4–10.8)

## 2022-03-18 ENCOUNTER — Encounter: Payer: Self-pay | Admitting: Family Medicine

## 2022-03-18 DIAGNOSIS — E1165 Type 2 diabetes mellitus with hyperglycemia: Secondary | ICD-10-CM

## 2022-03-22 ENCOUNTER — Other Ambulatory Visit: Payer: Self-pay | Admitting: Family Medicine

## 2022-03-22 DIAGNOSIS — E1165 Type 2 diabetes mellitus with hyperglycemia: Secondary | ICD-10-CM

## 2022-03-22 NOTE — Telephone Encounter (Signed)
Ozempic 1mg  was increased to 2mg  dose it looks like so refill on 1mg  refused.

## 2022-03-23 ENCOUNTER — Emergency Department (HOSPITAL_COMMUNITY)
Admission: EM | Admit: 2022-03-23 | Discharge: 2022-03-23 | Disposition: A | Discharge status: Home / Self Care | Payer: 59 | Attending: Emergency Medicine | Admitting: Emergency Medicine

## 2022-03-23 ENCOUNTER — Other Ambulatory Visit: Payer: Self-pay

## 2022-03-23 ENCOUNTER — Emergency Department (HOSPITAL_COMMUNITY): Payer: 59

## 2022-03-23 ENCOUNTER — Encounter (HOSPITAL_COMMUNITY): Payer: Self-pay

## 2022-03-23 DIAGNOSIS — Z7982 Long term (current) use of aspirin: Secondary | ICD-10-CM | POA: Diagnosis not present

## 2022-03-23 DIAGNOSIS — Z79899 Other long term (current) drug therapy: Secondary | ICD-10-CM | POA: Diagnosis not present

## 2022-03-23 DIAGNOSIS — Z7984 Long term (current) use of oral hypoglycemic drugs: Secondary | ICD-10-CM | POA: Insufficient documentation

## 2022-03-23 DIAGNOSIS — Z794 Long term (current) use of insulin: Secondary | ICD-10-CM | POA: Insufficient documentation

## 2022-03-23 DIAGNOSIS — R079 Chest pain, unspecified: Secondary | ICD-10-CM

## 2022-03-23 DIAGNOSIS — R0789 Other chest pain: Secondary | ICD-10-CM | POA: Insufficient documentation

## 2022-03-23 DIAGNOSIS — E119 Type 2 diabetes mellitus without complications: Secondary | ICD-10-CM | POA: Insufficient documentation

## 2022-03-23 DIAGNOSIS — I10 Essential (primary) hypertension: Secondary | ICD-10-CM | POA: Diagnosis not present

## 2022-03-23 LAB — BASIC METABOLIC PANEL
Anion gap: 7 (ref 5–15)
BUN: 13 mg/dL (ref 8–23)
CO2: 25 mmol/L (ref 22–32)
Calcium: 9.2 mg/dL (ref 8.9–10.3)
Chloride: 107 mmol/L (ref 98–111)
Creatinine, Ser: 0.71 mg/dL (ref 0.61–1.24)
GFR, Estimated: >60 mL/min (ref >60)
Glucose, Bld: 132 mg/dL — ABNORMAL HIGH (ref 70–99)
Potassium: 4.4 mmol/L (ref 3.5–5.1)
Sodium: 139 mmol/L (ref 135–145)

## 2022-03-23 LAB — CBC
HCT: 43.1 % (ref 39.0–52.0)
Hemoglobin: 14.4 g/dL (ref 13.0–17.0)
MCH: 31.6 pg (ref 26.0–34.0)
MCHC: 33.4 g/dL (ref 30.0–36.0)
MCV: 94.5 fL (ref 80.0–100.0)
Platelets: 249 10*3/uL (ref 150–400)
RBC: 4.56 MIL/uL (ref 4.22–5.81)
RDW: 13.2 % (ref 11.5–15.5)
WBC: 5.7 10*3/uL (ref 4.0–10.5)
nRBC: 0 % (ref 0.0–0.2)

## 2022-03-23 LAB — TROPONIN I (HIGH SENSITIVITY)
Troponin I (High Sensitivity): 2 ng/L (ref <18)
Troponin I (High Sensitivity): <2 ng/L (ref <18)

## 2022-03-23 LAB — CBG MONITORING, ED: Glucose-Capillary: 100 mg/dL — ABNORMAL HIGH (ref 70–99)

## 2022-03-23 MED ORDER — ASPIRIN 81 MG PO CHEW
324.0000 mg | CHEWABLE_TABLET | Freq: Once | ORAL | Status: DC
  Filled 2022-03-23: qty 4

## 2022-03-23 MED ORDER — SUCRALFATE 1 G PO TABS: 1.0000 g | ORAL_TABLET | Freq: Three times a day (TID) | ORAL | 0 refills | Status: DC

## 2022-03-23 NOTE — Discharge Instructions (Signed)
Increase your Prilosec to 40 mg daily.  Take the Carafate in addition to that.  Follow-up with a cardiologist for further evaluation.  Return as needed for worsening symptoms

## 2022-03-23 NOTE — ED Provider Notes (Signed)
[facility] EMERGENCY DEPARTMENT Provider Note   CSN: [id number] Arrival date & time: 03/23/22  1037     History  Chief Complaint  Patient presents with   Chest Pain    Daniel Scott is a 61 y.o. male.   Chest Pain  Patient has a history of hypertension, diabetes, hyperlipidemia, obesity who presents to the ED with complaints of chest discomfort.  Patient states he started having the symptoms yesterday.  It was a tightness in his chest that was more intense and severe but gradually decreased throughout the day.  He did note that his blood pressure was elevated.  Patient was able to go to bed but the discomfort never completely resolved.  This morning it persisted so he figured he should get it checked out.  Patient does not have a history of coronary artery disease that he is aware of but does remember having a cardiac catheterization in around 2006 for an episode of chest discomfort.  He does not have any stents.  Patient went to an urgent care this morning and they sent him to the ED for further evaluation    Home Medications Prior to Admission medications   Medication Sig Start Date End Date Taking? Authorizing Provider  amLODipine (NORVASC) 10 MG tablet Take 1 tablet (10 mg total) by mouth daily. 03/02/22  Yes Gwenlyn Fudge, FNP  Apple Cider Vinegar 500 MG TABS  05/06/21  Yes [provider]  aspirin 325 MG EC tablet Take 325 mg by mouth daily.   Yes [provider]  atorvastatin (LIPITOR) 80 MG tablet Take 1 tablet (80 mg total) by mouth daily. 03/02/22  Yes [name] F, FNP  Cholecalciferol (DIALYVITE VITAMIN D 5000) 125 MCG (5000 UT) capsule Take 5,000 Units by mouth daily.   Yes [provider]  CINNAMON PO  05/06/21  Yes [provider]  Cyanocobalamin (B-12) 5000 MCG CAPS Take 5,000 mcg by mouth daily.   Yes [provider]  empagliflozin (JARDIANCE) 25 MG TABS tablet TAKE 1 TABLET BY MOUTH EVERY DAY BEFORE BREAKFAST 03/02/22   Yes Gwenlyn Fudge, FNP  gabapentin (NEURONTIN) 600 MG tablet Take 1 tablet (600 mg total) by mouth 3 (three) times daily. 03/02/22  Yes [name] F, FNP  Garlic 1000 MG CAPS Take 1,000 mg by mouth 2 (two) times daily.   Yes [provider]  HYDROcodone-acetaminophen (NORCO) 5-325 MG tablet Take 1 tablet by mouth every 6 (six) hours as needed for moderate pain. 03/02/22  Yes Gwenlyn Fudge, FNP  Krill Oil 500 MG CAPS Take 500 mg by mouth daily.   Yes [provider]  levothyroxine (SYNTHROID) 75 MCG tablet TAKE 1 TABLET BY MOUTH EVERY DAY BEFORE BREAKFAST 03/02/22  Yes [name] F, FNP  lisinopril (ZESTRIL) 20 MG tablet Take 1 tablet (20 mg total) by mouth daily. 11/30/21  Yes [name] F, FNP  metFORMIN (GLUCOPHAGE) 500 MG tablet TAKE 1 TABLET BY MOUTH 2 TIMES DAILY WITH A MEAL. 02/14/22  Yes [name] F, FNP  Multiple Vitamin (MULTIVITAMIN ADULT PO) Take 1 tablet by mouth daily.    Yes [provider]  omeprazole (PRILOSEC) 20 MG capsule Take 1 capsule (20 mg total) by mouth daily. 11/30/21  Yes Gwenlyn Fudge, FNP  Semaglutide, 2 MG/DOSE, 8 MG/3ML SOPN Inject 2 mg as directed once a week. 03/02/22  Yes [name] F, FNP  spironolactone (ALDACTONE) 25 MG tablet TAKE 1 TABLET (25 MG TOTAL) BY MOUTH DAILY.  02/14/22  Yes Gwenlyn Fudge, FNP  sucralfate (CARAFATE) 1 g tablet Take 1 tablet (1 g total) by mouth 4 (four) times daily -  with meals and at bedtime. 03/23/22  Yes Linwood Dibbles, MD  HYDROcodone-acetaminophen (NORCO) 5-325 MG tablet Take 1 tablet by mouth every 6 (six) hours as needed for moderate pain. Patient not taking: Reported on 03/23/2022 04/01/22   Gwenlyn Fudge, FNP  HYDROcodone-acetaminophen (NORCO) 5-325 MG tablet Take 1 tablet by mouth every 6 (six) hours as needed for moderate pain. Patient not taking: Reported on 03/23/2022 05/01/22   Gwenlyn Fudge, FNP  sildenafil (VIAGRA) 100 MG tablet Take 1 tablet (100 mg total) by mouth daily  as needed for erectile dysfunction. 03/02/22   Gwenlyn Fudge, FNP      Allergies    Sulfa antibiotics    Review of Systems   Review of Systems  Cardiovascular:  Positive for chest pain.    Physical Exam Updated Vital Signs BP 122/81   Pulse 75   Temp 97.8 F (36.6 C) (Oral)   Resp 17   Ht 1.778 m (5\' 10" )   Wt (!) 156.5 kg   SpO2 96%   BMI 49.50 kg/m  Physical Exam Vitals and nursing note reviewed.  Constitutional:      General: He is not in acute distress.    Appearance: He is well-developed. He is obese.  HENT:     Head: Normocephalic and atraumatic.     Right Ear: External ear normal.     Left Ear: External ear normal.  Eyes:     General: No scleral icterus.       Right eye: No discharge.        Left eye: No discharge.     Conjunctiva/sclera: Conjunctivae normal.  Neck:     Trachea: No tracheal deviation.  Cardiovascular:     Rate and Rhythm: Normal rate and regular rhythm.  Pulmonary:     Effort: Pulmonary effort is normal. No respiratory distress.     Breath sounds: Normal breath sounds. No stridor. No wheezing or rales.  Abdominal:     General: Bowel sounds are normal. There is no distension.     Palpations: Abdomen is soft.     Tenderness: There is no abdominal tenderness. There is no guarding or rebound.  Musculoskeletal:        General: No tenderness or deformity.     Cervical back: Neck supple.  Skin:    General: Skin is warm and dry.     Findings: No rash.  Neurological:     General: No focal deficit present.     Mental Status: He is alert.     Cranial Nerves: No cranial nerve deficit (no facial droop, extraocular movements intact, no slurred speech).     Sensory: No sensory deficit.     Motor: No abnormal muscle tone or seizure activity.     Coordination: Coordination normal.  Psychiatric:        Mood and Affect: Mood normal.     ED Results / Procedures / Treatments   Labs (all labs ordered are listed, but only abnormal results are  displayed) Labs Reviewed  BASIC METABOLIC PANEL - Abnormal; Notable for the following components:      Result Value   Glucose, Bld 132 (*)    All other components within normal limits  CBG MONITORING, ED - Abnormal; Notable for the following components:   Glucose-Capillary 100 (*)    All other  components within normal limits  CBC  TROPONIN I (HIGH SENSITIVITY)  TROPONIN I (HIGH SENSITIVITY)    EKG EKG Interpretation  Date/Time:  Wednesday March 23 2022 10:50:51 EDT Ventricular Rate:  66 PR Interval:  225 QRS Duration: 88 QT Interval:  371 QTC Calculation: 389 R Axis:   18 Text Interpretation: Sinus rhythm Prolonged PR interval Low voltage, precordial leads Since last tracing rate slower Confirmed by Linwood Dibbles [phone]) on 03/23/2022 10:55:36 AM  Radiology DG Chest Portable 1 View  Result Date: 03/23/2022 CLINICAL DATA:  Chest pain. EXAM: PORTABLE CHEST 1 VIEW COMPARISON:  November 03, 2020. FINDINGS: The heart size and mediastinal contours are within normal limits. Both lungs are clear. The visualized skeletal structures are unremarkable. IMPRESSION: No active disease. Electronically Signed   By: Lupita Raider M.D.   On: 03/23/2022 11:37    Procedures Procedures    Medications Ordered in ED Medications  aspirin chewable tablet 324 mg (324 mg Oral Not Given 03/23/22 1123)    ED Course/ Medical Decision Making/ A&P Clinical Course as of 03/23/22 1518  Wed Mar 23, 2022  1240 Troponin I (High Sensitivity) Trop normal  [JK]  1314 CBC Normal [JK]  1314 Basic metabolic panel(!) Normal [JK]  1314 Troponin I (High Sensitivity) Normal [JK]  1314 DG Chest Portable 1 View Chest x-ray without acute findings [JK]  1422 Troponin I (High Sensitivity) Second troponin normal [JK]    Clinical Course User Index [JK] Linwood Dibbles, MD           HEART Score: 3                Medical Decision Making Problems Addressed: Nonspecific chest pain: acute illness or injury that poses a  threat to life or bodily functions  Amount and/or Complexity of Data Reviewed Labs: ordered. Decision-making details documented in ED Course. Radiology: ordered and independent interpretation performed. Decision-making details documented in ED Course.  Risk OTC drugs. Prescription drug management.   Patient presented to the ED for evaluation of chest pain.  No history of coronary artery disease but does have risk factors.  Discomfort has been for 24 hours.  Low risk heart score.  Serial troponins are normal.  With his 24-hour symptoms and normal troponins, low suspicion for ACS at this time.  I do think patient would benefit from further outpatient work-up.  Will refer to cardiology.  He does have history of acid reflux and will try increasing his antacids.  Nursing notes, lab results, and x-ray interpretations were reviewed and considered as part of the evaluation and were factored into the medical decision making. Evaluation and diagnostic testing in the emergency department does not suggest an emergent condition requiring admission or immediate intervention beyond what has been performed at this time.  The patient is safe for discharge and has been instructed to return immediately for worsening symptoms, change in symptoms or any other concerns.          Final Clinical Impression(s) / ED Diagnoses Final diagnoses:  Nonspecific chest pain    Rx / DC Orders ED Discharge Orders          Ordered    sucralfate (CARAFATE) 1 g tablet  3 times daily with meals & bedtime        03/23/22 1513    Ambulatory referral to Cardiology       Comments: If you have not heard from the Cardiology office within the next 72 hours please call [phone].  03/23/22 1513              Linwood Dibbles, MD 03/23/22 [phone]

## 2022-03-23 NOTE — ED Triage Notes (Signed)
Patient brought in by [facility] after being seen at [facility] for chest tightness x1 day.  Increased this morning. Urgent care gave the 325 mg ASA and 1 nitro.  Patient states that he has a little chest tightness now, but not as bad as earlier this morning.

## 2022-03-24 ENCOUNTER — Encounter: Payer: Self-pay | Admitting: Family Medicine

## 2022-03-28 ENCOUNTER — Other Ambulatory Visit: Payer: Self-pay | Admitting: Family Medicine

## 2022-03-28 DIAGNOSIS — E1165 Type 2 diabetes mellitus with hyperglycemia: Secondary | ICD-10-CM

## 2022-03-29 MED ORDER — SEMAGLUTIDE (2 MG/DOSE) 8 MG/3ML ~~LOC~~ SOPN: 2.0000 mg | PEN_INJECTOR | SUBCUTANEOUS | 1 refills | Status: DC

## 2022-03-29 MED ORDER — SEMAGLUTIDE (1 MG/DOSE) 4 MG/3ML ~~LOC~~ SOPN: 1.0000 mg | PEN_INJECTOR | SUBCUTANEOUS | 1 refills | Status: DC

## 2022-03-29 NOTE — Addendum Note (Signed)
Addended by: Gwenlyn Fudge on: 03/29/2022 08:28 AM   Modules accepted: Orders

## 2022-03-30 MED ORDER — TRULICITY 4.5 MG/0.5ML ~~LOC~~ SOAJ: 4.5000 mg | SUBCUTANEOUS | 1 refills | Status: DC

## 2022-03-30 NOTE — Addendum Note (Signed)
Addended by: Gwenlyn Fudge on: 03/30/2022 04:46 PM   Modules accepted: Orders

## 2022-04-06 ENCOUNTER — Encounter: Payer: Self-pay | Admitting: *Deleted

## 2022-04-07 ENCOUNTER — Ambulatory Visit (INDEPENDENT_AMBULATORY_CARE_PROVIDER_SITE_OTHER): Payer: 59 | Admitting: Family Medicine

## 2022-04-07 ENCOUNTER — Encounter: Payer: Self-pay | Admitting: Family Medicine

## 2022-04-07 VITALS — BP 121/78 | HR 48 | Temp 97.4°F | Ht 70.0 in | Wt 338.2 lb

## 2022-04-07 DIAGNOSIS — M5441 Lumbago with sciatica, right side: Secondary | ICD-10-CM | POA: Diagnosis not present

## 2022-04-07 DIAGNOSIS — M5442 Lumbago with sciatica, left side: Secondary | ICD-10-CM | POA: Diagnosis not present

## 2022-04-07 DIAGNOSIS — G8929 Other chronic pain: Secondary | ICD-10-CM | POA: Diagnosis not present

## 2022-04-07 DIAGNOSIS — M5416 Radiculopathy, lumbar region: Secondary | ICD-10-CM | POA: Diagnosis not present

## 2022-04-07 NOTE — Progress Notes (Signed)
Assessment & Plan:  1-2. 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 May increase gabapentin every 3-5 days to 600 mg TID. He states he may increase by one tablet during the day. Referral placed to [facility] in [place].  - Ambulatory referral to Orthopedic Surgery   Follow up plan: Return as scheduled.  [name], MSN, APRN, FNP-C [facility]  Subjective:   Patient ID: SAMEUL TAGLE, male    DOB: 09-24-1960, 61 y.o.   MRN: [id number]  HPI: LARAY CORBIT is a 61 y.o. male presenting on 04/07/2022 for bilateral leg pain (Ongoing. Patient states that it has been going on for awhile and he is having trouble lifting his right leg. Does not see Dr. Ophelia Charter anymore. )  Patient is here due to ongoing pain. He has chronic low back pain with bilateral sciatica that is not controlled. He is having trouble lifting or putting weight on his right leg. His leg is almost giving out at times. He has a prescription of Norco, but does not take it very often. He also has gabapentin of which he takes 300 mg TID. He is unable to see a neurosurgeon due to his insurance.   Lumbar spine x-ray on 10/26/2021: Diffuse multilevel degenerative change with multilevel disc degeneration, endplate osteophyte formation and facet hypertrophy.   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   ROS: Negative unless specifically indicated above in HPI.   Relevant past medical history reviewed and updated as indicated.   Allergies and medications reviewed and updated.   Current Outpatient Medications:    amLODipine (NORVASC) 10 MG tablet, Take 1 tablet (10 mg total) by mouth daily., Disp: 90 tablet, Rfl: 1    Apple Cider Vinegar 500 MG TABS, , Disp: , Rfl:    aspirin 325 MG EC tablet, Take 325 mg by mouth daily., Disp: , Rfl:    atorvastatin (LIPITOR) 80 MG tablet, Take 1 tablet (80 mg total) by mouth daily., Disp: 90 tablet, Rfl: 1   Cholecalciferol (DIALYVITE VITAMIN D 5000) 125 MCG (5000 UT) capsule, Take 5,000 Units by mouth daily., Disp: , Rfl:    CINNAMON PO, , Disp: , Rfl:    Cyanocobalamin (B-12) 5000 MCG CAPS, Take 5,000 mcg by mouth daily., Disp: , Rfl:    Dulaglutide (TRULICITY) 4.5 MG/0.5ML SOPN, Inject 4.5 mg as directed once a week., Disp: 6 mL, Rfl: 1   empagliflozin (JARDIANCE) 25 MG TABS tablet, TAKE 1 TABLET BY MOUTH EVERY DAY BEFORE BREAKFAST, Disp: 90 tablet, Rfl: 1   gabapentin (NEURONTIN) 600 MG tablet, Take 1 tablet (600 mg total) by mouth 3 (three) times daily., Disp: 270 tablet, Rfl: 1   Garlic 1000 MG CAPS, Take 1,000 mg by mouth 2 (two) times daily., Disp: , Rfl:    HYDROcodone-acetaminophen (NORCO) 5-325 MG tablet, Take 1 tablet by mouth every 6 (six) hours as needed for moderate pain., Disp: 90 tablet, Rfl: 0   HYDROcodone-acetaminophen (NORCO) 5-325 MG tablet, Take 1 tablet by mouth every 6 (six) hours as needed for moderate pain., Disp: 90 tablet, Rfl: 0   [START ON 05/01/2022] HYDROcodone-acetaminophen (NORCO) 5-325 MG tablet, Take 1 tablet by mouth every 6 (six) hours as needed for moderate pain., Disp: 90 tablet, Rfl: 0   Krill Oil 500 MG  CAPS, Take 500 mg by mouth daily., Disp: , Rfl:    levothyroxine (SYNTHROID) 75 MCG tablet, TAKE 1 TABLET BY MOUTH EVERY DAY BEFORE BREAKFAST, Disp: 90 tablet, Rfl: 1   lisinopril (ZESTRIL) 20 MG tablet, Take 1 tablet (20 mg total) by mouth daily., Disp: 90 tablet, Rfl: 1   metFORMIN (GLUCOPHAGE) 500 MG tablet, TAKE 1 TABLET BY MOUTH 2 TIMES DAILY WITH A MEAL., Disp: 180 tablet, Rfl: 0   Multiple Vitamin (MULTIVITAMIN ADULT PO), Take 1 tablet by mouth daily. , Disp: , Rfl:    omeprazole (PRILOSEC) 20 MG capsule, Take 1 capsule (20 mg  total) by mouth daily., Disp: 90 capsule, Rfl: 1   spironolactone (ALDACTONE) 25 MG tablet, TAKE 1 TABLET (25 MG TOTAL) BY MOUTH DAILY., Disp: 90 tablet, Rfl: 0   sildenafil (VIAGRA) 100 MG tablet, Take 1 tablet (100 mg total) by mouth daily as needed for erectile dysfunction. (Patient not taking: Reported on 04/07/2022), Disp: 30 tablet, Rfl: 2   sucralfate (CARAFATE) 1 g tablet, Take 1 tablet (1 g total) by mouth 4 (four) times daily -  with meals and at bedtime. (Patient not taking: Reported on 04/07/2022), Disp: 28 tablet, Rfl: 0  Allergies  Allergen Reactions   Sulfa Antibiotics     Objective:   BP 121/78   Pulse (!) 48   Temp (!) 97.4 F (36.3 C) (Temporal)   Ht 5\' 10"  (1.778 m)   Wt (!) 338 lb 3.2 oz (153.4 kg)   SpO2 95%   BMI 48.53 kg/m    Physical Exam Vitals reviewed.  Constitutional:      General: He is not in acute distress.    Appearance: Normal appearance. He is not ill-appearing, toxic-appearing or diaphoretic.  HENT:     Head: Normocephalic and atraumatic.  Eyes:     General: No scleral icterus.       Right eye: No discharge.        Left eye: No discharge.     Conjunctiva/sclera: Conjunctivae normal.  Cardiovascular:     Rate and Rhythm: Normal rate.  Pulmonary:     Effort: Pulmonary effort is normal. No respiratory distress.  Musculoskeletal:        General: Normal range of motion.     Cervical back: Normal range of motion.  Skin:    General: Skin is warm and dry.  Neurological:     Mental Status: He is alert and oriented to person, place, and time. Mental status is at baseline.     Gait: Gait abnormal.  Psychiatric:        Mood and Affect: Mood normal.        Behavior: Behavior normal.        Thought Content: Thought content normal.        Judgment: Judgment normal.

## 2022-04-08 ENCOUNTER — Encounter: Payer: Self-pay | Admitting: Family Medicine

## 2022-04-11 ENCOUNTER — Encounter: Payer: Self-pay | Admitting: Family Medicine

## 2022-04-14 ENCOUNTER — Encounter: Payer: Self-pay | Admitting: Cardiology

## 2022-04-14 ENCOUNTER — Encounter: Payer: Self-pay | Admitting: Family Medicine

## 2022-04-14 ENCOUNTER — Ambulatory Visit (INDEPENDENT_AMBULATORY_CARE_PROVIDER_SITE_OTHER): Payer: 59

## 2022-04-14 ENCOUNTER — Ambulatory Visit (INDEPENDENT_AMBULATORY_CARE_PROVIDER_SITE_OTHER): Payer: 59 | Admitting: Cardiology

## 2022-04-14 VITALS — BP 114/64 | HR 98 | Ht 70.0 in | Wt 336.8 lb

## 2022-04-14 DIAGNOSIS — R0683 Snoring: Secondary | ICD-10-CM | POA: Diagnosis not present

## 2022-04-14 DIAGNOSIS — I493 Ventricular premature depolarization: Secondary | ICD-10-CM

## 2022-04-14 DIAGNOSIS — E559 Vitamin D deficiency, unspecified: Secondary | ICD-10-CM

## 2022-04-14 DIAGNOSIS — R0609 Other forms of dyspnea: Secondary | ICD-10-CM | POA: Diagnosis not present

## 2022-04-14 DIAGNOSIS — R5383 Other fatigue: Secondary | ICD-10-CM | POA: Diagnosis not present

## 2022-04-14 DIAGNOSIS — R079 Chest pain, unspecified: Secondary | ICD-10-CM | POA: Diagnosis not present

## 2022-04-14 MED ORDER — METOPROLOL SUCCINATE ER 25 MG PO TB24: 12.5000 mg | ORAL_TABLET | Freq: Every day | ORAL | 3 refills | Status: DC

## 2022-04-14 MED ORDER — NITROGLYCERIN 0.4 MG SL SUBL: 0.4000 mg | SUBLINGUAL_TABLET | SUBLINGUAL | 3 refills | Status: DC | PRN

## 2022-04-14 NOTE — Progress Notes (Unsigned)
Enrolled for [employer] to mail a ZIO XT long term holter monitor to the patients address on file.  

## 2022-04-14 NOTE — Patient Instructions (Signed)
Medication Instructions:  START: Metoprolol Succinate 12.5 mg at night START: For as needed Nitroglycerin, if you develop chest pain: Sit and rest 5 minutes. If chest pain does not resolve place 1 nitroglycerin under your tongue and wait 5 minutes. If chest pain does not resolve, place a 2nd nitroglycerin under your tongue and wait 5 more minutes. If chest pain does not resolve, place a 3rd nitroglycerin under your tongue and seek emergency services.    *If you need a refill on your cardiac medications before your next appointment, please call your pharmacy*   Lab Work: Your physician recommends lab work today (BMP, CBC, vitamin D)  If you have labs (blood work) drawn today and your tests are completely normal, you will receive your results only by: MyChart Message (if you have MyChart) OR A paper copy in the mail If you have any lab test that is abnormal or we need to change your treatment, we will call you to review the results.   Testing/Procedures: Your physician has recommended that you have a sleep study. This test records several body functions during sleep, including: brain activity, eye movement, oxygen and carbon dioxide blood levels, heart rate and rhythm, breathing rate and rhythm, the flow of air through your mouth and nose, snoring, body muscle movements, and chest and belly movement. [facility]  Your physician has recommended that you wear a 14 day Zio monitor.   This monitor is a medical device that records the heart's electrical activity. Doctors most often use these monitors to diagnose arrhythmias. Arrhythmias are problems with the speed or rhythm of the heartbeat. The monitor is a small device applied to your chest. You can wear one while you do your normal daily activities. While wearing this monitor if you have any symptoms to push the button and record what you felt. Once you have worn this monitor for the period of time provider prescribed (Usually 14 days),  you will return the monitor device in the postage paid box. Once it is returned they will download the data collected and provide [place] with a report which the provider will then review and we will call you with those results. Important tips:  Avoid showering during the first 24 hours of wearing the monitor. Avoid excessive sweating to help maximize wear time. Do not submerge the device, no hot tubs, and no swimming pools. Keep any lotions or oils away from the patch. After 24 hours you may shower with the patch on. Take brief showers with your back facing the shower head.  Do not remove patch once it has been placed because that will interrupt data and decrease adhesive wear time. Push the button when you have any symptoms and write down what you were feeling. Once you have completed wearing your monitor, remove and place into box which has postage paid and place in your outgoing mailbox.  If for some reason you have misplaced your box then call our office and we can provide another box and/or mail it off for you.  Your physician has requested that you have a cardiac catheterization. Cardiac catheterization is used to diagnose and/or treat various heart conditions. Doctors may recommend this procedure for a number of different reasons. The most common reason is to evaluate chest pain. Chest pain can be a symptom of coronary artery disease (CAD), and cardiac catheterization can show whether plaque is narrowing or blocking your heart's arteries. This procedure is also used to evaluate the valves, as well as measure  the blood flow and oxygen levels in different parts of your heart. For further information please visit [email or web address] Please follow instruction sheet, as given.  [facility]    Follow-Up: At [facility], you and your health needs are our priority.  As part of our continuing mission to provide you with exceptional heart care, we have created designated Provider Care Teams.   These Care Teams include your primary Cardiologist (physician) and Advanced Practice Providers (APPs -  Physician Assistants and Nurse Practitioners) who all work together to provide you with the care you need, when you need it.  We recommend signing up for the patient portal called "MyChart".  Sign up information is provided on this After Visit Summary.  MyChart is used to connect with patients for Virtual Visits (Telemedicine).  Patients are able to view lab/test results, encounter notes, upcoming appointments, etc.  Non-urgent messages can be sent to your provider as well.   To learn more about what you can do with MyChart, go to [email or web address].    Your next appointment:   4-6 week(s)  The format for your next appointment:   In Person  Provider:   Thomasene Ripple, DO {   [facility] CARDIOVASCULAR DIVISION [facility] [address] [place] [address] [place] [address] Dept: [phone] Loc: [phone]  Daniel Scott  04/14/2022  You are scheduled for a Cardiac Catheterization on Thursday, August 17 with Dr. Verdis Prime.  1. Please arrive at the Main Entrance A at [facility]: [address] [place], [place] [address] at 8:30 AM (This time is two hours before your procedure to ensure your preparation). Free valet parking service is available.   Special note: Every effort is made to have your procedure done on time. Please understand that emergencies sometimes delay scheduled procedures.  2. Diet: Do not eat solid foods after midnight.  You may have clear liquids until 5 AM upon the day of the procedure.  3. Labs: You will need to have blood drawn today (CBC, BMP, Vitamin D)  4. Medication instructions in preparation for your procedure:   Contrast Allergy: No  Hold Jardiance the morning of procedure  Do not take Diabetes Med Glucophage (Metformin) on the day of the procedure and HOLD 48 HOURS AFTER THE PROCEDURE.  On the  morning of your procedure, take Aspirin and any morning medicines NOT listed above.  You may use sips of water.  5. Plan to go home the same day, you will only stay overnight if medically necessary. 6. You MUST have a responsible adult to drive you home. 7. An adult MUST be with you the first 24 hours after you arrive home. 8. Bring a current list of your medications, and the last time and date medication taken. 9. Bring ID and current insurance cards. 10.Please wear clothes that are easy to get on and off and wear slip-on shoes.  Thank you for allowing [place] to care for you!   -- [facility]

## 2022-04-14 NOTE — Progress Notes (Signed)
Cardiology Office Note:    Date:  04/14/2022   ID:  Daniel Scott, DOB 03/24/1961, MRN [id number]  PCP:  Daniel Fudge, FNP  Cardiologist:  Daniel Ripple, DO  Electrophysiologist:  None   Referring MD: Daniel Dibbles, MD   " I am experiencing chest pain"  History of Present Illness:    Daniel Scott is a 61 y.o. male with a hx of hypertension, hyperlipidemia, diabetes mellitus hemoglobin A1c 7.1 which was done on March 02, 2022, minimal coronary artery disease on heart catheterization which was done back in 2006, morbid obesity and depression here today to be evaluated for chest discomfort.  Patient tells me that over the last few weeks he has been experiencing worsening midsternal chest discomfort.  He described as a pressure-like sensation.  He feels as if someone is putting a pressure area in his chest.  Is intermittent.  Comes and goes.  Nothing makes it better or worse.  He admits that this is associated with shortness of breath and exertion.  Separate from this chest pain he has had increasing palpitations.  He notes that he bought himself a Cytogeneticist and saw that he has significant PVCs.  He is concerned because his father had multiple MIs starting at the age of 49 and subsequent died from cardiovascular complications.  He also tells me that he is experiencing significant fatigue, he is got daytime somnolence and noticed that he is snoring as well.  No other complaints at this time.   Past Medical History:  Diagnosis Date   Arthritis    hands and feet   Depression    Diabetes mellitus without complication (HCC)    type 2   Fatty liver    GERD (gastroesophageal reflux disease)    Hiatal hernia    Hyperlipidemia    Hypertension    Hypothyroidism    Neuromuscular disorder (HCC)    stenosis   Pneumonia 1990   Stenosis of cervical spine    Thyroid disease     Past Surgical History:  Procedure Laterality Date   ANTERIOR CERVICAL DECOMP/DISCECTOMY FUSION N/A 06/02/2020    Procedure: ANTERIOR CERVICAL DECOMPRESSION/DISCECTOMY FUSIONCERVICAL FOUR- CERVICAL FIVE, CERVICAL FIVE- CERVICAL SIX;  Surgeon: Dawley, Alan Mulder, DO;  Location: [facility] OR;  Service: Neurosurgery;  Laterality: N/A;  ANTERIOR CERVICAL DECOMPRESSION/DISCECTOMY FUSIONCERVICAL FOUR- CERVICAL FIVE, CERVICAL FIVE- CERVICAL SIX   CARDIAC CATHETERIZATION  1996; 2006   [facility]   HYDROCELE EXCISION     LAPAROSCOPIC GASTRIC BANDING     LAPAROSCOPIC ROUX-EN-Y GASTRIC BYPASS WITH UPPER ENDOSCOPY AND REMOVAL OF LAP BAND  02/2012   TONSILLECTOMY      Current Medications: Current Meds  Medication Sig   amLODipine (NORVASC) 10 MG tablet Take 1 tablet (10 mg total) by mouth daily.   Apple Cider Vinegar 500 MG TABS    aspirin 325 MG EC tablet Take 325 mg by mouth daily.   atorvastatin (LIPITOR) 80 MG tablet Take 1 tablet (80 mg total) by mouth daily.   Cholecalciferol (DIALYVITE VITAMIN D 5000) 125 MCG (5000 UT) capsule Take 5,000 Units by mouth daily.   CINNAMON PO    Cyanocobalamin (B-12) 5000 MCG CAPS Take 5,000 mcg by mouth daily.   Dulaglutide (TRULICITY) 4.5 MG/0.5ML SOPN Inject 4.5 mg as directed once a week.   empagliflozin (JARDIANCE) 25 MG TABS tablet TAKE 1 TABLET BY MOUTH EVERY DAY BEFORE BREAKFAST   gabapentin (NEURONTIN) 600 MG tablet Take 1 tablet (600 mg total) by mouth  3 (three) times daily.   Garlic 1000 MG CAPS Take 1,000 mg by mouth 2 (two) times daily.   HYDROcodone-acetaminophen (NORCO) 5-325 MG tablet Take 1 tablet by mouth every 6 (six) hours as needed for moderate pain.   HYDROcodone-acetaminophen (NORCO) 5-325 MG tablet Take 1 tablet by mouth every 6 (six) hours as needed for moderate pain.   [START ON 05/01/2022] HYDROcodone-acetaminophen (NORCO) 5-325 MG tablet Take 1 tablet by mouth every 6 (six) hours as needed for moderate pain.   Krill Oil 500 MG CAPS Take 500 mg by mouth daily.   levothyroxine (SYNTHROID) 75 MCG tablet TAKE 1 TABLET BY MOUTH EVERY DAY BEFORE  BREAKFAST   lisinopril (ZESTRIL) 20 MG tablet Take 1 tablet (20 mg total) by mouth daily.   metFORMIN (GLUCOPHAGE) 500 MG tablet TAKE 1 TABLET BY MOUTH 2 TIMES DAILY WITH A MEAL.   metoprolol succinate (TOPROL-XL) 25 MG 24 hr tablet Take 0.5 tablets (12.5 mg total) by mouth daily. Take with or immediately following a meal.   Multiple Vitamin (MULTIVITAMIN ADULT PO) Take 1 tablet by mouth daily.    nitroGLYCERIN (NITROSTAT) 0.4 MG SL tablet Place 1 tablet (0.4 mg total) under the tongue every 5 (five) minutes as needed for chest pain.   omeprazole (PRILOSEC) 20 MG capsule Take 1 capsule (20 mg total) by mouth daily.   sildenafil (VIAGRA) 100 MG tablet Take 1 tablet (100 mg total) by mouth daily as needed for erectile dysfunction.   spironolactone (ALDACTONE) 25 MG tablet TAKE 1 TABLET (25 MG TOTAL) BY MOUTH DAILY.     Allergies:   Sulfa antibiotics   Social History   Socioeconomic History   Marital status: Married    Spouse name: Not on file   Number of children: Not on file   Years of education: Not on file   Highest education level: Not on file  Occupational History   Not on file  Tobacco Use   Smoking status: Former    Types: Cigarettes    Quit date: 05/2009    Years since quitting: 12.9   Smokeless tobacco: Never  Vaping Use   Vaping Use: Never used  Substance and Sexual Activity   Alcohol use: Yes    Comment: Couple times a week   Drug use: Yes    Types: Marijuana    Comment: occ   Sexual activity: Yes    Birth control/protection: None  Other Topics Concern   Not on file  Social History Narrative   Not on file   Social Determinants of Health   Financial Resource Strain: Not on file  Food Insecurity: Not on file  Transportation Needs: Not on file  Physical Activity: Not on file  Stress: Not on file  Social Connections: Not on file     Family History: The patient's family history includes Anxiety disorder in his son; Autism in his son and son; Bipolar disorder  in his son; Breast cancer in his maternal grandmother; Cancer in his mother; Colon cancer in his father; Coronary artery disease in his father; Depression in his son; Diabetes in his maternal grandfather and mother; Heart disease in his father, maternal grandfather, and paternal grandfather; Hyperlipidemia in his father; Hypertension in his father, maternal grandfather, maternal grandmother, mother, paternal grandfather, paternal grandmother, and son; Kidney disease in his mother; Lung cancer in his maternal grandfather; Spina bifida in his daughter; Stroke in his maternal grandfather and maternal grandmother; Thyroid disease in his mother; Vascular Disease in his mother.  ROS:     Review of Systems  Constitution: Reports fatigue, snoring and daytime somnolence.  Negative for decreased appetite, fever and weight gain.  HENT: Negative for congestion, ear discharge, hoarse voice and sore throat.   Eyes: Negative for discharge, redness, vision loss in right eye and visual halos.  Cardiovascular: Reports chest pain, dyspnea on exertion, and palpitation.  Negative for leg swelling, orthopnea. Respiratory: Negative for cough, hemoptysis, shortness of breath.   Endocrine: Negative for heat intolerance and polyphagia.  Hematologic/Lymphatic: Negative for bleeding problem. Does not bruise/bleed easily.  Skin: Negative for flushing, nail changes, rash and suspicious lesions.  Musculoskeletal: Negative for arthritis, joint pain, muscle cramps, myalgias, neck pain and stiffness.  Gastrointestinal: Negative for abdominal pain, bowel incontinence, diarrhea and excessive appetite.  Genitourinary: Negative for decreased libido, genital sores and incomplete emptying.  Neurological: Negative for brief paralysis, focal weakness, headaches and loss of balance.  Psychiatric/Behavioral: Negative for altered mental status, depression and suicidal ideas.  Allergic/Immunologic: Negative for HIV exposure and persistent  infections.    EKGs/Labs/Other Studies Reviewed:    The following studies were reviewed today:   EKG:  The ekg ordered today demonstrates   Recent Labs: 11/30/2021: TSH 1.450 03/02/2022: ALT 47 03/23/2022: BUN 13; Creatinine, Ser 0.71; Hemoglobin 14.4; Platelets 249; Potassium 4.4; Sodium 139  Recent Lipid Panel    Component Value Date/Time   CHOL 121 03/02/2022 0804   TRIG 114 03/02/2022 0804   HDL 39 (L) 03/02/2022 0804   CHOLHDL 3.1 03/02/2022 0804   LDLCALC 61 03/02/2022 0804    Physical Exam:    VS:  BP 114/64   Pulse 98   Ht 5' 10" (1.778 m)   Wt (!) 336 lb 12.8 oz (152.8 kg)   SpO2 95%   BMI 48.33 kg/m     Wt Readings from Last 3 Encounters:  04/14/22 (!) 336 lb 12.8 oz (152.8 kg)  04/07/22 (!) 338 lb 3.2 oz (153.4 kg)  03/23/22 (!) 345 lb (156.5 kg)     GEN: Well nourished, well developed in no acute distress HEENT: Normal NECK: No JVD; No carotid bruits LYMPHATICS: No lymphadenopathy CARDIAC: S1S2 noted,RRR, no murmurs, rubs, gallops RESPIRATORY:  Clear to auscultation without rales, wheezing or rhonchi  ABDOMEN: Soft, non-tender, non-distended, +bowel sounds, no guarding. EXTREMITIES: No edema, No cyanosis, no clubbing MUSCULOSKELETAL:  No deformity  SKIN: Warm and dry NEUROLOGIC:  Alert and oriented x 3, non-focal PSYCHIATRIC:  Normal affect, good insight  ASSESSMENT:    1. DOE (dyspnea on exertion)   2. Chest pain, unspecified type   3. Fatigue, unspecified type   4. Snoring   5. Vitamin D deficiency   6. PVC (premature ventricular contraction)    PLAN:    His chest pain disinsertion coupled with his history of minimal coronary artery disease and diabetes makes the patient a significantly high risk patient.  Therefore I like to proceed with left heart catheterization in this patient. The patient understands that risks include but are not limited to stroke (1 in 1000), death (1 in 1000), kidney failure [usually temporary] (1 in 500), bleeding  (1 in 200), allergic reaction [possibly serious] (1 in 200), and agrees to proceed.  Sublingual nitroglycerin prescription was sent, its protocol and 911 protocol explained and the patient vocalized understanding questions were answered to the patient's satisfaction.  I did explain to the patient how to use his nitroglycerin with his phosphodiesterase inhibitors.  He is experiencing fatigue daytime somnolence that is highly suspected especially with his body habitus for   sleep apnea.  We will set the patient up for sleep study.  For this patient in lab sleep study will be the best test.  We need to understand his PVC burden we will send a monitor for the patient to wear.  I suspect this to be frequent.  Since he is symptomatic in the meantime we will place the patient on Toprol-XL 12.5 mg daily.  The patient understands the need to lose weight with diet and exercise. We have discussed specific strategies for this.   The patient is in agreement with the above plan. The patient left the office in stable condition.  The patient will follow up in 4 weeks.   Medication Adjustments/Labs and Tests Ordered: Current medicines are reviewed at length with the patient today.  Concerns regarding medicines are outlined above.  Orders Placed This Encounter  Procedures   CBC   Basic metabolic panel   VITAMIN D 25 Hydroxy (Vit-D Deficiency, Fractures)   LONG TERM MONITOR (3-14 DAYS)   EKG 12-Lead   Split night study   Meds ordered this encounter  Medications   metoprolol succinate (TOPROL-XL) 25 MG 24 hr tablet    Sig: Take 0.5 tablets (12.5 mg total) by mouth daily. Take with or immediately following a meal.    Dispense:  45 tablet    Refill:  3   nitroGLYCERIN (NITROSTAT) 0.4 MG SL tablet    Sig: Place 1 tablet (0.4 mg total) under the tongue every 5 (five) minutes as needed for chest pain.    Dispense:  25 tablet    Refill:  3    Patient Instructions  Medication Instructions:  START:  Metoprolol Succinate 12.5 mg at night START: For as needed Nitroglycerin, if you develop chest pain: Sit and rest 5 minutes. If chest pain does not resolve place 1 nitroglycerin under your tongue and wait 5 minutes. If chest pain does not resolve, place a 2nd nitroglycerin under your tongue and wait 5 more minutes. If chest pain does not resolve, place a 3rd nitroglycerin under your tongue and seek emergency services.    *If you need a refill on your cardiac medications before your next appointment, please call your pharmacy*   Lab Work: Your physician recommends lab work today (BMP, CBC, vitamin D)  If you have labs (blood work) drawn today and your tests are completely normal, you will receive your results only by: MyChart Message (if you have MyChart) OR A paper copy in the mail If you have any lab test that is abnormal or we need to change your treatment, we will call you to review the results.   Testing/Procedures: Your physician has recommended that you have a sleep study. This test records several body functions during sleep, including: brain activity, eye movement, oxygen and carbon dioxide blood levels, heart rate and rhythm, breathing rate and rhythm, the flow of air through your mouth and nose, snoring, body muscle movements, and chest and belly movement. [facility]  Your physician has recommended that you wear a 14 day Zio monitor.   This monitor is a medical device that records the heart's electrical activity. Doctors most often use these monitors to diagnose arrhythmias. Arrhythmias are problems with the speed or rhythm of the heartbeat. The monitor is a small device applied to your chest. You can wear one while you do your normal daily activities. While wearing this monitor if you have any symptoms to push the button and record what you felt. Once you have   worn this monitor for the period of time provider prescribed (Usually 14 days), you will return the monitor  device in the postage paid box. Once it is returned they will download the data collected and provide [place] with a report which the provider will then review and we will call you with those results. Important tips:  Avoid showering during the first 24 hours of wearing the monitor. Avoid excessive sweating to help maximize wear time. Do not submerge the device, no hot tubs, and no swimming pools. Keep any lotions or oils away from the patch. After 24 hours you may shower with the patch on. Take brief showers with your back facing the shower head.  Do not remove patch once it has been placed because that will interrupt data and decrease adhesive wear time. Push the button when you have any symptoms and write down what you were feeling. Once you have completed wearing your monitor, remove and place into box which has postage paid and place in your outgoing mailbox.  If for some reason you have misplaced your box then call our office and we can provide another box and/or mail it off for you.  Your physician has requested that you have a cardiac catheterization. Cardiac catheterization is used to diagnose and/or treat various heart conditions. Doctors may recommend this procedure for a number of different reasons. The most common reason is to evaluate chest pain. Chest pain can be a symptom of coronary artery disease (CAD), and cardiac catheterization can show whether plaque is narrowing or blocking your heart's arteries. This procedure is also used to evaluate the valves, as well as measure the blood flow and oxygen levels in different parts of your heart. For further information please visit [email or web address] Please follow instruction sheet, as given.  [facility]    Follow-Up: At [facility], you and your health needs are our priority.  As part of our continuing mission to provide you with exceptional heart care, we have created designated Provider Care Teams.  These Care Teams include your  primary Cardiologist (physician) and Advanced Practice Providers (APPs -  Physician Assistants and Nurse Practitioners) who all work together to provide you with the care you need, when you need it.  We recommend signing up for the patient portal called "MyChart".  Sign up information is provided on this After Visit Summary.  MyChart is used to connect with patients for Virtual Visits (Telemedicine).  Patients are able to view lab/test results, encounter notes, upcoming appointments, etc.  Non-urgent messages can be sent to your provider as well.   To learn more about what you can do with MyChart, go to [email or web address].    Your next appointment:   4-6 week(s)  The format for your next appointment:   In Person  Provider:   Thomasene Ripple, DO {   [facility] CARDIOVASCULAR DIVISION [facility] [address] [place] [address] Dept: [phone] Loc: [phone]  ANGELINO RUMERY  04/14/2022  You are scheduled for a Cardiac Catheterization on Thursday, August 17 with Dr. Verdis Prime.  1. Please arrive at the Main Entrance A at [facility] [address] [place], [place] [address] at 8:30 AM (This time is two hours before your procedure to ensure your preparation). Free valet parking service is available.   Special note: Every effort is made to have your procedure done on time. Please understand that emergencies sometimes delay scheduled procedures.  2. Diet: Do not  eat solid foods after midnight.  You may have clear liquids until 5 AM upon the day of the procedure.  3. Labs: You will need to have blood drawn today (CBC, BMP, Vitamin D)  4. Medication instructions in preparation for your procedure:   Contrast Allergy: No  Hold Jardiance the morning of procedure  Do not take Diabetes Med Glucophage (Metformin) on the day of the procedure and HOLD 48 HOURS AFTER THE PROCEDURE.  On the morning of your procedure, take  Aspirin and any morning medicines NOT listed above.  You may use sips of water.  5. Plan to go home the same day, you will only stay overnight if medically necessary. 6. You MUST have a responsible adult to drive you home. 7. An adult MUST be with you the first 24 hours after you arrive home. 8. Bring a current list of your medications, and the last time and date medication taken. 9. Bring ID and current insurance cards. 10.Please wear clothes that are easy to get on and off and wear slip-on shoes.  Thank you for allowing us to care for you!   --  Invasive Cardiovascular services         Adopting a Healthy Lifestyle.  Know what a healthy weight is for you (roughly BMI <25) and aim to maintain this   Aim for 7+ servings of fruits and vegetables daily   65-80+ fluid ounces of water or unsweet tea for healthy kidneys   Limit to max 1 drink of alcohol per day; avoid smoking/tobacco   Limit animal fats in diet for cholesterol and heart health - choose grass fed whenever available   Avoid highly processed foods, and foods high in saturated/trans fats   Aim for low stress - take time to unwind and care for your mental health   Aim for 150 min of moderate intensity exercise weekly for heart health, and weights twice weekly for bone health   Aim for 7-9 hours of sleep daily   When it comes to diets, agreement about the perfect plan isnt easy to find, even among the experts. Experts at the [employer] developed an idea known as the Healthy Eating Plate. Just imagine a plate divided into logical, healthy portions.   The emphasis is on diet quality:   Load up on vegetables and fruits - one-half of your plate: Aim for color and variety, and remember that potatoes dont count.   Go for whole grains - one-quarter of your plate: Whole wheat, barley, wheat berries, quinoa, oats, brown rice, and foods made with them. If you want pasta, go with whole wheat  pasta.   Protein power - one-quarter of your plate: Fish, chicken, beans, and nuts are all healthy, versatile protein sources. Limit red meat.   The diet, however, does go beyond the plate, offering a few other suggestions.   Use healthy plant oils, such as olive, canola, soy, corn, sunflower and peanut. Check the labels, and avoid partially hydrogenated oil, which have unhealthy trans fats.   If youre thirsty, drink water. Coffee and tea are good in moderation, but skip sugary drinks and limit milk and dairy products to one or two daily servings.   The type of carbohydrate in the diet is more important than the amount. Some sources of carbohydrates, such as vegetables, fruits, whole grains, and beans-are healthier than others.   Finally, stay active  Signed, Elajah Kunsman, DO  04/14/2022 11:01 AM    [facility]

## 2022-04-14 NOTE — H&P (View-Only) (Signed)
Cardiology Office Note:    Date:  04/14/2022   ID:  Daniel Scott, DOB 03/24/1961, MRN [id number]  PCP:  Gwenlyn Fudge, FNP  Cardiologist:  Thomasene Ripple, DO  Electrophysiologist:  None   Referring MD: Linwood Dibbles, MD   " I am experiencing chest pain"  History of Present Illness:    Daniel Scott is a 61 y.o. male with a hx of hypertension, hyperlipidemia, diabetes mellitus hemoglobin A1c 7.1 which was done on March 02, 2022, minimal coronary artery disease on heart catheterization which was done back in 2006, morbid obesity and depression here today to be evaluated for chest discomfort.  Patient tells me that over the last few weeks he has been experiencing worsening midsternal chest discomfort.  He described as a pressure-like sensation.  He feels as if someone is putting a pressure area in his chest.  Is intermittent.  Comes and goes.  Nothing makes it better or worse.  He admits that this is associated with shortness of breath and exertion.  Separate from this chest pain he has had increasing palpitations.  He notes that he bought himself a Cytogeneticist and saw that he has significant PVCs.  He is concerned because his father had multiple MIs starting at the age of 49 and subsequent died from cardiovascular complications.  He also tells me that he is experiencing significant fatigue, he is got daytime somnolence and noticed that he is snoring as well.  No other complaints at this time.   Past Medical History:  Diagnosis Date   Arthritis    hands and feet   Depression    Diabetes mellitus without complication (HCC)    type 2   Fatty liver    GERD (gastroesophageal reflux disease)    Hiatal hernia    Hyperlipidemia    Hypertension    Hypothyroidism    Neuromuscular disorder (HCC)    stenosis   Pneumonia 1990   Stenosis of cervical spine    Thyroid disease     Past Surgical History:  Procedure Laterality Date   ANTERIOR CERVICAL DECOMP/DISCECTOMY FUSION N/A 06/02/2020    Procedure: ANTERIOR CERVICAL DECOMPRESSION/DISCECTOMY FUSIONCERVICAL FOUR- CERVICAL FIVE, CERVICAL FIVE- CERVICAL SIX;  Surgeon: Dawley, Alan Mulder, DO;  Location: [facility] OR;  Service: Neurosurgery;  Laterality: N/A;  ANTERIOR CERVICAL DECOMPRESSION/DISCECTOMY FUSIONCERVICAL FOUR- CERVICAL FIVE, CERVICAL FIVE- CERVICAL SIX   CARDIAC CATHETERIZATION  1996; 2006   [facility]   HYDROCELE EXCISION     LAPAROSCOPIC GASTRIC BANDING     LAPAROSCOPIC ROUX-EN-Y GASTRIC BYPASS WITH UPPER ENDOSCOPY AND REMOVAL OF LAP BAND  02/2012   TONSILLECTOMY      Current Medications: Current Meds  Medication Sig   amLODipine (NORVASC) 10 MG tablet Take 1 tablet (10 mg total) by mouth daily.   Apple Cider Vinegar 500 MG TABS    aspirin 325 MG EC tablet Take 325 mg by mouth daily.   atorvastatin (LIPITOR) 80 MG tablet Take 1 tablet (80 mg total) by mouth daily.   Cholecalciferol (DIALYVITE VITAMIN D 5000) 125 MCG (5000 UT) capsule Take 5,000 Units by mouth daily.   CINNAMON PO    Cyanocobalamin (B-12) 5000 MCG CAPS Take 5,000 mcg by mouth daily.   Dulaglutide (TRULICITY) 4.5 MG/0.5ML SOPN Inject 4.5 mg as directed once a week.   empagliflozin (JARDIANCE) 25 MG TABS tablet TAKE 1 TABLET BY MOUTH EVERY DAY BEFORE BREAKFAST   gabapentin (NEURONTIN) 600 MG tablet Take 1 tablet (600 mg total) by mouth  3 (three) times daily.   Garlic 123XX123 MG CAPS Take 1,000 mg by mouth 2 (two) times daily.   HYDROcodone-acetaminophen (NORCO) 5-325 MG tablet Take 1 tablet by mouth every 6 (six) hours as needed for moderate pain.   HYDROcodone-acetaminophen (NORCO) 5-325 MG tablet Take 1 tablet by mouth every 6 (six) hours as needed for moderate pain.   [START ON 05/01/2022] HYDROcodone-acetaminophen (NORCO) 5-325 MG tablet Take 1 tablet by mouth every 6 (six) hours as needed for moderate pain.   Krill Oil 500 MG CAPS Take 500 mg by mouth daily.   levothyroxine (SYNTHROID) 75 MCG tablet TAKE 1 TABLET BY MOUTH EVERY DAY BEFORE  BREAKFAST   lisinopril (ZESTRIL) 20 MG tablet Take 1 tablet (20 mg total) by mouth daily.   metFORMIN (GLUCOPHAGE) 500 MG tablet TAKE 1 TABLET BY MOUTH 2 TIMES DAILY WITH A MEAL.   metoprolol succinate (TOPROL-XL) 25 MG 24 hr tablet Take 0.5 tablets (12.5 mg total) by mouth daily. Take with or immediately following a meal.   Multiple Vitamin (MULTIVITAMIN ADULT PO) Take 1 tablet by mouth daily.    nitroGLYCERIN (NITROSTAT) 0.4 MG SL tablet Place 1 tablet (0.4 mg total) under the tongue every 5 (five) minutes as needed for chest pain.   omeprazole (PRILOSEC) 20 MG capsule Take 1 capsule (20 mg total) by mouth daily.   sildenafil (VIAGRA) 100 MG tablet Take 1 tablet (100 mg total) by mouth daily as needed for erectile dysfunction.   spironolactone (ALDACTONE) 25 MG tablet TAKE 1 TABLET (25 MG TOTAL) BY MOUTH DAILY.     Allergies:   Sulfa antibiotics   Social History   Socioeconomic History   Marital status: Married    Spouse name: Not on file   Number of children: Not on file   Years of education: Not on file   Highest education level: Not on file  Occupational History   Not on file  Tobacco Use   Smoking status: Former    Types: Cigarettes    Quit date: 05/2009    Years since quitting: 12.9   Smokeless tobacco: Never  Vaping Use   Vaping Use: Never used  Substance and Sexual Activity   Alcohol use: Yes    Comment: Couple times a week   Drug use: Yes    Types: Marijuana    Comment: occ   Sexual activity: Yes    Birth control/protection: None  Other Topics Concern   Not on file  Social History Narrative   Not on file   Social Determinants of Health   Financial Resource Strain: Not on file  Food Insecurity: Not on file  Transportation Needs: Not on file  Physical Activity: Not on file  Stress: Not on file  Social Connections: Not on file     Family History: The patient's family history includes Anxiety disorder in his son; Autism in his son and son; Bipolar disorder  in his son; Breast cancer in his maternal grandmother; Cancer in his mother; Colon cancer in his father; Coronary artery disease in his father; Depression in his son; Diabetes in his maternal grandfather and mother; Heart disease in his father, maternal grandfather, and paternal grandfather; Hyperlipidemia in his father; Hypertension in his father, maternal grandfather, maternal grandmother, mother, paternal grandfather, paternal grandmother, and son; Kidney disease in his mother; Lung cancer in his maternal grandfather; Spina bifida in his daughter; Stroke in his maternal grandfather and maternal grandmother; Thyroid disease in his mother; Vascular Disease in his mother.  ROS:  Review of Systems  Constitution: Reports fatigue, snoring and daytime somnolence.  Negative for decreased appetite, fever and weight gain.  HENT: Negative for congestion, ear discharge, hoarse voice and sore throat.   Eyes: Negative for discharge, redness, vision loss in right eye and visual halos.  Cardiovascular: Reports chest pain, dyspnea on exertion, and palpitation.  Negative for leg swelling, orthopnea. Respiratory: Negative for cough, hemoptysis, shortness of breath.   Endocrine: Negative for heat intolerance and polyphagia.  Hematologic/Lymphatic: Negative for bleeding problem. Does not bruise/bleed easily.  Skin: Negative for flushing, nail changes, rash and suspicious lesions.  Musculoskeletal: Negative for arthritis, joint pain, muscle cramps, myalgias, neck pain and stiffness.  Gastrointestinal: Negative for abdominal pain, bowel incontinence, diarrhea and excessive appetite.  Genitourinary: Negative for decreased libido, genital sores and incomplete emptying.  Neurological: Negative for brief paralysis, focal weakness, headaches and loss of balance.  Psychiatric/Behavioral: Negative for altered mental status, depression and suicidal ideas.  Allergic/Immunologic: Negative for HIV exposure and persistent  infections.    EKGs/Labs/Other Studies Reviewed:    The following studies were reviewed today:   EKG:  The ekg ordered today demonstrates   Recent Labs: 11/30/2021: TSH 1.450 03/02/2022: ALT 47 03/23/2022: BUN 13; Creatinine, Ser 0.71; Hemoglobin 14.4; Platelets 249; Potassium 4.4; Sodium 139  Recent Lipid Panel    Component Value Date/Time   CHOL 121 03/02/2022 0804   TRIG 114 03/02/2022 0804   HDL 39 (L) 03/02/2022 0804   CHOLHDL 3.1 03/02/2022 0804   LDLCALC 61 03/02/2022 0804    Physical Exam:    VS:  BP 114/64   Pulse 98   Ht 5\' 10"  (1.778 m)   Wt (!) 336 lb 12.8 oz (152.8 kg)   SpO2 95%   BMI 48.33 kg/m     Wt Readings from Last 3 Encounters:  04/14/22 (!) 336 lb 12.8 oz (152.8 kg)  04/07/22 (!) 338 lb 3.2 oz (153.4 kg)  03/23/22 (!) 345 lb (156.5 kg)     GEN: Well nourished, well developed in no acute distress HEENT: Normal NECK: No JVD; No carotid bruits LYMPHATICS: No lymphadenopathy CARDIAC: S1S2 noted,RRR, no murmurs, rubs, gallops RESPIRATORY:  Clear to auscultation without rales, wheezing or rhonchi  ABDOMEN: Soft, non-tender, non-distended, +bowel sounds, no guarding. EXTREMITIES: No edema, No cyanosis, no clubbing MUSCULOSKELETAL:  No deformity  SKIN: Warm and dry NEUROLOGIC:  Alert and oriented x 3, non-focal PSYCHIATRIC:  Normal affect, good insight  ASSESSMENT:    1. DOE (dyspnea on exertion)   2. Chest pain, unspecified type   3. Fatigue, unspecified type   4. Snoring   5. Vitamin D deficiency   6. PVC (premature ventricular contraction)    PLAN:    His chest pain disinsertion coupled with his history of minimal coronary artery disease and diabetes makes the patient a significantly high risk patient.  Therefore I like to proceed with left heart catheterization in this patient. The patient understands that risks include but are not limited to stroke (1 in 1000), death (1 in 63), kidney failure [usually temporary] (1 in 500), bleeding  (1 in 200), allergic reaction [possibly serious] (1 in 200), and agrees to proceed.  Sublingual nitroglycerin prescription was sent, its protocol and 911 protocol explained and the patient vocalized understanding questions were answered to the patient's satisfaction.  I did explain to the patient how to use his nitroglycerin with his phosphodiesterase inhibitors.  He is experiencing fatigue daytime somnolence that is highly suspected especially with his body habitus for  sleep apnea.  We will set the patient up for sleep study.  For this patient in lab sleep study will be the best test.  We need to understand his PVC burden we will send a monitor for the patient to wear.  I suspect this to be frequent.  Since he is symptomatic in the meantime we will place the patient on Toprol-XL 12.5 mg daily.  The patient understands the need to lose weight with diet and exercise. We have discussed specific strategies for this.   The patient is in agreement with the above plan. The patient left the office in stable condition.  The patient will follow up in 4 weeks.   Medication Adjustments/Labs and Tests Ordered: Current medicines are reviewed at length with the patient today.  Concerns regarding medicines are outlined above.  Orders Placed This Encounter  Procedures   CBC   Basic metabolic panel   VITAMIN D 25 Hydroxy (Vit-D Deficiency, Fractures)   LONG TERM MONITOR (3-14 DAYS)   EKG 12-Lead   Split night study   Meds ordered this encounter  Medications   metoprolol succinate (TOPROL-XL) 25 MG 24 hr tablet    Sig: Take 0.5 tablets (12.5 mg total) by mouth daily. Take with or immediately following a meal.    Dispense:  45 tablet    Refill:  3   nitroGLYCERIN (NITROSTAT) 0.4 MG SL tablet    Sig: Place 1 tablet (0.4 mg total) under the tongue every 5 (five) minutes as needed for chest pain.    Dispense:  25 tablet    Refill:  3    Patient Instructions  Medication Instructions:  START:  Metoprolol Succinate 12.5 mg at night START: For as needed Nitroglycerin, if you develop chest pain: Sit and rest 5 minutes. If chest pain does not resolve place 1 nitroglycerin under your tongue and wait 5 minutes. If chest pain does not resolve, place a 2nd nitroglycerin under your tongue and wait 5 more minutes. If chest pain does not resolve, place a 3rd nitroglycerin under your tongue and seek emergency services.    *If you need a refill on your cardiac medications before your next appointment, please call your pharmacy*   Lab Work: Your physician recommends lab work today (BMP, CBC, vitamin D)  If you have labs (blood work) drawn today and your tests are completely normal, you will receive your results only by: MyChart Message (if you have MyChart) OR A paper copy in the mail If you have any lab test that is abnormal or we need to change your treatment, we will call you to review the results.   Testing/Procedures: Your physician has recommended that you have a sleep study. This test records several body functions during sleep, including: brain activity, eye movement, oxygen and carbon dioxide blood levels, heart rate and rhythm, breathing rate and rhythm, the flow of air through your mouth and nose, snoring, body muscle movements, and chest and belly movement. [facility]  Your physician has recommended that you wear a 14 day Zio monitor.   This monitor is a medical device that records the heart's electrical activity. Doctors most often use these monitors to diagnose arrhythmias. Arrhythmias are problems with the speed or rhythm of the heartbeat. The monitor is a small device applied to your chest. You can wear one while you do your normal daily activities. While wearing this monitor if you have any symptoms to push the button and record what you felt. Once you have  worn this monitor for the period of time provider prescribed (Usually 14 days), you will return the monitor  device in the postage paid box. Once it is returned they will download the data collected and provide [place] with a report which the provider will then review and we will call you with those results. Important tips:  Avoid showering during the first 24 hours of wearing the monitor. Avoid excessive sweating to help maximize wear time. Do not submerge the device, no hot tubs, and no swimming pools. Keep any lotions or oils away from the patch. After 24 hours you may shower with the patch on. Take brief showers with your back facing the shower head.  Do not remove patch once it has been placed because that will interrupt data and decrease adhesive wear time. Push the button when you have any symptoms and write down what you were feeling. Once you have completed wearing your monitor, remove and place into box which has postage paid and place in your outgoing mailbox.  If for some reason you have misplaced your box then call our office and we can provide another box and/or mail it off for you.  Your physician has requested that you have a cardiac catheterization. Cardiac catheterization is used to diagnose and/or treat various heart conditions. Doctors may recommend this procedure for a number of different reasons. The most common reason is to evaluate chest pain. Chest pain can be a symptom of coronary artery disease (CAD), and cardiac catheterization can show whether plaque is narrowing or blocking your heart's arteries. This procedure is also used to evaluate the valves, as well as measure the blood flow and oxygen levels in different parts of your heart. For further information please visit [email or web address] Please follow instruction sheet, as given.  [facility]    Follow-Up: At [facility], you and your health needs are our priority.  As part of our continuing mission to provide you with exceptional heart care, we have created designated Provider Care Teams.  These Care Teams include your  primary Cardiologist (physician) and Advanced Practice Providers (APPs -  Physician Assistants and Nurse Practitioners) who all work together to provide you with the care you need, when you need it.  We recommend signing up for the patient portal called "MyChart".  Sign up information is provided on this After Visit Summary.  MyChart is used to connect with patients for Virtual Visits (Telemedicine).  Patients are able to view lab/test results, encounter notes, upcoming appointments, etc.  Non-urgent messages can be sent to your provider as well.   To learn more about what you can do with MyChart, go to [email or web address].    Your next appointment:   4-6 week(s)  The format for your next appointment:   In Person  Provider:   Thomasene Ripple, DO {   [facility] CARDIOVASCULAR DIVISION [facility] [address] [place] [address] Dept: [phone] Loc: [phone]  Daniel Scott  04/14/2022  You are scheduled for a Cardiac Catheterization on Thursday, August 17 with Dr. Verdis Prime.  1. Please arrive at the Main Entrance A at [facility] [address] [place], [place] [address] at 8:30 AM (This time is two hours before your procedure to ensure your preparation). Free valet parking service is available.   Special note: Every effort is made to have your procedure done on time. Please understand that emergencies sometimes delay scheduled procedures.  2. Diet: Do not  eat solid foods after midnight.  You may have clear liquids until 5 AM upon the day of the procedure.  3. Labs: You will need to have blood drawn today (CBC, BMP, Vitamin D)  4. Medication instructions in preparation for your procedure:   Contrast Allergy: No  Hold Jardiance the morning of procedure  Do not take Diabetes Med Glucophage (Metformin) on the day of the procedure and HOLD 48 HOURS AFTER THE PROCEDURE.  On the morning of your procedure, take  Aspirin and any morning medicines NOT listed above.  You may use sips of water.  5. Plan to go home the same day, you will only stay overnight if medically necessary. 6. You MUST have a responsible adult to drive you home. 7. An adult MUST be with you the first 24 hours after you arrive home. 8. Bring a current list of your medications, and the last time and date medication taken. 9. Bring ID and current insurance cards. 10.Please wear clothes that are easy to get on and off and wear slip-on shoes.  Thank you for allowing [place] to care for you!   -- [facility]         Adopting a Healthy Lifestyle.  Know what a healthy weight is for you (roughly BMI <25) and aim to maintain this   Aim for 7+ servings of fruits and vegetables daily   65-80+ fluid ounces of water or unsweet tea for healthy kidneys   Limit to max 1 drink of alcohol per day; avoid smoking/tobacco   Limit animal fats in diet for cholesterol and heart health - choose grass fed whenever available   Avoid highly processed foods, and foods high in saturated/trans fats   Aim for low stress - take time to unwind and care for your mental health   Aim for 150 min of moderate intensity exercise weekly for heart health, and weights twice weekly for bone health   Aim for 7-9 hours of sleep daily   When it comes to diets, agreement about the perfect plan isnt easy to find, even among the experts. Experts at the [employer] developed an idea known as the Healthy Eating Plate. Just imagine a plate divided into logical, healthy portions.   The emphasis is on diet quality:   Load up on vegetables and fruits - one-half of your plate: Aim for color and variety, and remember that potatoes dont count.   Go for whole grains - one-quarter of your plate: Whole wheat, barley, wheat berries, quinoa, oats, brown rice, and foods made with them. If you want pasta, go with whole wheat  pasta.   Protein power - one-quarter of your plate: Fish, chicken, beans, and nuts are all healthy, versatile protein sources. Limit red meat.   The diet, however, does go beyond the plate, offering a few other suggestions.   Use healthy plant oils, such as olive, canola, soy, corn, sunflower and peanut. Check the labels, and avoid partially hydrogenated oil, which have unhealthy trans fats.   If youre thirsty, drink water. Coffee and tea are good in moderation, but skip sugary drinks and limit milk and dairy products to one or two daily servings.   The type of carbohydrate in the diet is more important than the amount. Some sources of carbohydrates, such as vegetables, fruits, whole grains, and beans-are healthier than others.   Finally, stay active  Signed, Berniece Salines, DO  04/14/2022 11:01 AM    [facility]

## 2022-04-15 LAB — BASIC METABOLIC PANEL
BUN/Creatinine Ratio: 24 (ref 10–24)
BUN: 19 mg/dL (ref 8–27)
CO2: 22 mmol/L (ref 20–29)
Calcium: 9.9 mg/dL (ref 8.6–10.2)
Chloride: 101 mmol/L (ref 96–106)
Creatinine, Ser: 0.79 mg/dL (ref 0.76–1.27)
Glucose: 136 mg/dL — ABNORMAL HIGH (ref 70–99)
Potassium: 5.4 mmol/L — ABNORMAL HIGH (ref 3.5–5.2)
Sodium: 141 mmol/L (ref 134–144)
eGFR: 100 mL/min/{1.73_m2} (ref >59)

## 2022-04-15 LAB — CBC
Hematocrit: 45.5 % (ref 37.5–51.0)
Hemoglobin: 15.3 g/dL (ref 13.0–17.7)
MCH: 31.4 pg (ref 26.6–33.0)
MCHC: 33.6 g/dL (ref 31.5–35.7)
MCV: 93 fL (ref 79–97)
Platelets: 265 10*3/uL (ref 150–450)
RBC: 4.88 x10E6/uL (ref 4.14–5.80)
RDW: 12.9 % (ref 11.6–15.4)
WBC: 6.3 10*3/uL (ref 3.4–10.8)

## 2022-04-15 LAB — VITAMIN D 25 HYDROXY (VIT D DEFICIENCY, FRACTURES): Vit D, 25-Hydroxy: 63.5 ng/mL (ref 30.0–100.0)

## 2022-04-19 DIAGNOSIS — M25551 Pain in right hip: Secondary | ICD-10-CM | POA: Diagnosis not present

## 2022-04-19 DIAGNOSIS — M5459 Other low back pain: Secondary | ICD-10-CM | POA: Diagnosis not present

## 2022-04-20 ENCOUNTER — Telehealth: Payer: Self-pay | Admitting: *Deleted

## 2022-04-20 NOTE — Telephone Encounter (Signed)
Cardiac Catheterization scheduled at [facility] for: Thursday April 21, 2022 10:30 AM Arrival time and place: [facility] Main Entrance A at: 8:30 AM   Nothing to eat after midnight prior to procedure, clear liquids until 5 AM day of procedure.  Medication instructions: -Hold:  Metformin-day of procedure and 48 hours post procedure  Jardiance-AM of procedure -Except hold medications usual morning medications can be taken with sips of water including aspirin.  Confirmed patient has responsible adult to drive home post procedure and be with patient first 24 hours after arriving home.  Patient reports no new symptoms concerning for COVID-19 in the past 10 days.  Reviewed procedure instructions with patient.

## 2022-04-20 NOTE — H&P (Signed)
61 year old with DM II, hyperlipidemia, hypertension, positive family history CAD with a 2-week history of exertional related chest pressure. Referred for diagnostic coronary angiography and possible PCI if indicated. Lumbar disc disease.  May have difficulty lying flat. Morbid obesity.

## 2022-04-21 ENCOUNTER — Other Ambulatory Visit: Payer: Self-pay

## 2022-04-21 ENCOUNTER — Encounter (HOSPITAL_COMMUNITY)
Admission: RE | Disposition: A | Discharge status: Home / Self Care | Payer: Self-pay | Source: Home / Self Care | Attending: Interventional Cardiology

## 2022-04-21 ENCOUNTER — Ambulatory Visit (HOSPITAL_COMMUNITY)
Admission: RE | Admit: 2022-04-21 | Discharge: 2022-04-21 | Disposition: A | Discharge status: Home / Self Care | Payer: 59 | Attending: Interventional Cardiology | Admitting: Interventional Cardiology

## 2022-04-21 DIAGNOSIS — I110 Hypertensive heart disease with heart failure: Secondary | ICD-10-CM | POA: Insufficient documentation

## 2022-04-21 DIAGNOSIS — Z8249 Family history of ischemic heart disease and other diseases of the circulatory system: Secondary | ICD-10-CM | POA: Diagnosis not present

## 2022-04-21 DIAGNOSIS — R0602 Shortness of breath: Secondary | ICD-10-CM | POA: Insufficient documentation

## 2022-04-21 DIAGNOSIS — I700 Atherosclerosis of aorta: Secondary | ICD-10-CM

## 2022-04-21 DIAGNOSIS — I5032 Chronic diastolic (congestive) heart failure: Secondary | ICD-10-CM | POA: Diagnosis not present

## 2022-04-21 DIAGNOSIS — E785 Hyperlipidemia, unspecified: Secondary | ICD-10-CM | POA: Diagnosis not present

## 2022-04-21 DIAGNOSIS — R0683 Snoring: Secondary | ICD-10-CM | POA: Insufficient documentation

## 2022-04-21 DIAGNOSIS — Z7985 Long-term (current) use of injectable non-insulin antidiabetic drugs: Secondary | ICD-10-CM | POA: Insufficient documentation

## 2022-04-21 DIAGNOSIS — I10 Essential (primary) hypertension: Secondary | ICD-10-CM | POA: Diagnosis present

## 2022-04-21 DIAGNOSIS — R400 Somnolence: Secondary | ICD-10-CM | POA: Insufficient documentation

## 2022-04-21 DIAGNOSIS — F32A Depression, unspecified: Secondary | ICD-10-CM | POA: Diagnosis not present

## 2022-04-21 DIAGNOSIS — Z87891 Personal history of nicotine dependence: Secondary | ICD-10-CM | POA: Diagnosis not present

## 2022-04-21 DIAGNOSIS — R079 Chest pain, unspecified: Secondary | ICD-10-CM

## 2022-04-21 DIAGNOSIS — Z6841 Body Mass Index (BMI) 40.0 and over, adult: Secondary | ICD-10-CM | POA: Diagnosis not present

## 2022-04-21 DIAGNOSIS — Z7984 Long term (current) use of oral hypoglycemic drugs: Secondary | ICD-10-CM | POA: Diagnosis not present

## 2022-04-21 DIAGNOSIS — I2510 Atherosclerotic heart disease of native coronary artery without angina pectoris: Secondary | ICD-10-CM | POA: Insufficient documentation

## 2022-04-21 DIAGNOSIS — E119 Type 2 diabetes mellitus without complications: Secondary | ICD-10-CM | POA: Diagnosis not present

## 2022-04-21 DIAGNOSIS — E1159 Type 2 diabetes mellitus with other circulatory complications: Secondary | ICD-10-CM | POA: Diagnosis present

## 2022-04-21 DIAGNOSIS — E1165 Type 2 diabetes mellitus with hyperglycemia: Secondary | ICD-10-CM | POA: Diagnosis present

## 2022-04-21 DIAGNOSIS — R69 Illness, unspecified: Secondary | ICD-10-CM | POA: Diagnosis not present

## 2022-04-21 DIAGNOSIS — I152 Hypertension secondary to endocrine disorders: Secondary | ICD-10-CM | POA: Diagnosis present

## 2022-04-21 HISTORY — PX: LEFT HEART CATH AND CORONARY ANGIOGRAPHY: CATH118249

## 2022-04-21 LAB — GLUCOSE, CAPILLARY: Glucose-Capillary: 135 mg/dL — ABNORMAL HIGH (ref 70–99)

## 2022-04-21 LAB — POCT I-STAT, CHEM 8
BUN: 17 mg/dL (ref 8–23)
Calcium, Ion: 1.21 mmol/L (ref 1.15–1.40)
Chloride: 102 mmol/L (ref 98–111)
Creatinine, Ser: 0.8 mg/dL (ref 0.61–1.24)
Glucose, Bld: 119 mg/dL — ABNORMAL HIGH (ref 70–99)
HCT: 42 % (ref 39.0–52.0)
Hemoglobin: 14.3 g/dL (ref 13.0–17.0)
Potassium: 4.6 mmol/L (ref 3.5–5.1)
Sodium: 140 mmol/L (ref 135–145)
TCO2: 27 mmol/L (ref 22–32)

## 2022-04-21 SURGERY — LEFT HEART CATH AND CORONARY ANGIOGRAPHY
Anesthesia: LOCAL

## 2022-04-21 MED ORDER — SODIUM CHLORIDE 0.9 % WEIGHT BASED INFUSION: 1.0000 mL/kg/h | INTRAVENOUS | Status: DC

## 2022-04-21 MED ORDER — HEPARIN (PORCINE) IN NACL 1000-0.9 UT/500ML-% IV SOLN
INTRAVENOUS | Status: AC
  Filled 2022-04-21: qty 500

## 2022-04-21 MED ORDER — ASPIRIN 81 MG PO CHEW
81.0000 mg | CHEWABLE_TABLET | ORAL | Status: AC
  Administered 2022-04-21: 81 mg via ORAL

## 2022-04-21 MED ORDER — SODIUM CHLORIDE 0.9 % IV SOLN: 250.0000 mL | INTRAVENOUS | Status: DC | PRN

## 2022-04-21 MED ORDER — HEPARIN SODIUM (PORCINE) 1000 UNIT/ML IJ SOLN
INTRAMUSCULAR | Status: DC | PRN
  Administered 2022-04-21: 7000 [IU] via INTRAVENOUS

## 2022-04-21 MED ORDER — VERAPAMIL HCL 2.5 MG/ML IV SOLN
INTRAVENOUS | Status: DC | PRN
  Administered 2022-04-21: 10 mL via INTRA_ARTERIAL

## 2022-04-21 MED ORDER — SODIUM CHLORIDE 0.9% FLUSH: 3.0000 mL | INTRAVENOUS | Status: DC | PRN

## 2022-04-21 MED ORDER — ONDANSETRON HCL 4 MG/2ML IJ SOLN: 4.0000 mg | Freq: Four times a day (QID) | INTRAMUSCULAR | Status: DC | PRN

## 2022-04-21 MED ORDER — SODIUM CHLORIDE 0.9 % WEIGHT BASED INFUSION
3.0000 mL/kg/h | INTRAVENOUS | Status: AC
  Administered 2022-04-21: 3 mL/kg/h via INTRAVENOUS

## 2022-04-21 MED ORDER — SODIUM CHLORIDE 0.9% FLUSH: 3.0000 mL | Freq: Two times a day (BID) | INTRAVENOUS | Status: DC

## 2022-04-21 MED ORDER — MIDAZOLAM HCL 2 MG/2ML IJ SOLN
INTRAMUSCULAR | Status: AC
  Filled 2022-04-21: qty 2

## 2022-04-21 MED ORDER — LIDOCAINE HCL (PF) 1 % IJ SOLN
INTRAMUSCULAR | Status: AC
  Filled 2022-04-21: qty 60

## 2022-04-21 MED ORDER — SODIUM CHLORIDE 0.9 % IV SOLN: INTRAVENOUS | Status: DC

## 2022-04-21 MED ORDER — VERAPAMIL HCL 2.5 MG/ML IV SOLN
INTRAVENOUS | Status: AC
  Filled 2022-04-21: qty 2

## 2022-04-21 MED ORDER — HEPARIN SODIUM (PORCINE) 1000 UNIT/ML IJ SOLN
INTRAMUSCULAR | Status: AC
  Filled 2022-04-21: qty 10

## 2022-04-21 MED ORDER — LIDOCAINE HCL (PF) 1 % IJ SOLN
INTRAMUSCULAR | Status: DC | PRN
  Administered 2022-04-21 (×2): 2 mL

## 2022-04-21 MED ORDER — HYDRALAZINE HCL 20 MG/ML IJ SOLN: 10.0000 mg | INTRAMUSCULAR | Status: DC | PRN

## 2022-04-21 MED ORDER — ACETAMINOPHEN 325 MG PO TABS
650.0000 mg | ORAL_TABLET | ORAL | Status: DC | PRN
Start: 2022-04-21 — End: 2022-04-21

## 2022-04-21 MED ORDER — FENTANYL CITRATE (PF) 100 MCG/2ML IJ SOLN
INTRAMUSCULAR | Status: DC | PRN
  Administered 2022-04-21: 50 ug via INTRAVENOUS

## 2022-04-21 MED ORDER — FENTANYL CITRATE (PF) 100 MCG/2ML IJ SOLN
INTRAMUSCULAR | Status: AC
  Filled 2022-04-21: qty 2

## 2022-04-21 MED ORDER — OXYCODONE HCL 5 MG PO TABS: 5.0000 mg | ORAL_TABLET | ORAL | Status: DC | PRN

## 2022-04-21 MED ORDER — MIDAZOLAM HCL 2 MG/2ML IJ SOLN
INTRAMUSCULAR | Status: DC | PRN
  Administered 2022-04-21: 1 mg via INTRAVENOUS

## 2022-04-21 MED ORDER — HEPARIN (PORCINE) IN NACL 1000-0.9 UT/500ML-% IV SOLN
INTRAVENOUS | Status: DC | PRN
  Administered 2022-04-21 (×2): 500 mL

## 2022-04-21 MED ORDER — LABETALOL HCL 5 MG/ML IV SOLN: 10.0000 mg | INTRAVENOUS | Status: DC | PRN

## 2022-04-21 MED ORDER — IOHEXOL 350 MG/ML SOLN
INTRAVENOUS | Status: DC | PRN
  Administered 2022-04-21: 50 mL

## 2022-04-21 SURGICAL SUPPLY — 14 items
BAND CMPR LRG ZPHR (HEMOSTASIS) ×1
BAND ZEPHYR COMPRESS 30 LONG (HEMOSTASIS) IMPLANT
CATH 5FR JL3.5 JR4 ANG PIG MP (CATHETERS) IMPLANT
CATH BALLN WEDGE 5F 110CM (CATHETERS) IMPLANT
GLIDESHEATH SLEND A-KIT 6F 22G (SHEATH) IMPLANT
GUIDEWIRE INQWIRE 1.5J.035X260 (WIRE) IMPLANT
INQWIRE 1.5J .035X260CM (WIRE) ×1
KIT HEART LEFT (KITS) ×2 IMPLANT
PACK CARDIAC CATHETERIZATION (CUSTOM PROCEDURE TRAY) ×2 IMPLANT
SHEATH GLIDE SLENDER 4/5FR (SHEATH) IMPLANT
SHEATH PROBE COVER 6X72 (BAG) IMPLANT
TRANSDUCER W/STOPCOCK (MISCELLANEOUS) ×2 IMPLANT
TUBING CIL FLEX 10 FLL-RA (TUBING) ×2 IMPLANT
WIRE MICROINTRODUCER 60CM (WIRE) IMPLANT

## 2022-04-21 NOTE — Interval H&P Note (Signed)
Cath Lab Visit (complete for each Cath Lab visit)  Clinical Evaluation Leading to the Procedure:   ACS: No.  Non-ACS:    Anginal Classification: CCS III  Anti-ischemic medical therapy: Minimal Therapy (1 class of medications)  Non-Invasive Test Results: No non-invasive testing performed  Prior CABG: No previous CABG      History and Physical Interval Note:  04/21/2022 11:37 AM  [name]  has presented today for surgery, with the diagnosis of chest pain.  The various methods of treatment have been discussed with the patient and family. After consideration of risks, benefits and other options for treatment, the patient has consented to  Procedure(s): RIGHT/LEFT HEART CATH AND CORONARY ANGIOGRAPHY (N/A) as a surgical intervention.  The patient's history has been reviewed, patient examined, no change in status, stable for surgery.  I have reviewed the patient's chart and labs.  Questions were answered to the patient's satisfaction.     Lyn Records III

## 2022-04-21 NOTE — CV Procedure (Signed)
Right heart cath was not performed due to technical difficulty. Normal coronary arteries. Normal LV systolic function.  LVEDP 21 mmHg.

## 2022-04-21 NOTE — Progress Notes (Signed)
TR BAND REMOVAL  LOCATION:    right radial  DEFLATED PER PROTOCOL:    Yes.    TIME BAND OFF / DRESSING APPLIED: 1435   SITE UPON ARRIVAL:    Level 0  SITE AFTER BAND REMOVAL:    Level 0  CIRCULATION SENSATION AND MOVEMENT:    Within Normal Limits   Yes.    COMMENTS:   No complications.

## 2022-04-22 ENCOUNTER — Encounter (HOSPITAL_COMMUNITY): Payer: Self-pay | Admitting: Interventional Cardiology

## 2022-04-22 DIAGNOSIS — I493 Ventricular premature depolarization: Secondary | ICD-10-CM | POA: Diagnosis not present

## 2022-04-26 ENCOUNTER — Telehealth: Payer: Self-pay | Admitting: *Deleted

## 2022-04-26 NOTE — Telephone Encounter (Signed)
Call received from [facility] with [place] approval for sleep study. Auth # [id number]. Valid dates 04/23/22 to 10/22/22.

## 2022-04-26 NOTE — Telephone Encounter (Signed)
Received a call from [facility] with PA approval for sleep study. Auth # [id number].  Valid dates 04/23/21 to 10/22/22.  Disregard previous phone note.

## 2022-04-26 NOTE — Telephone Encounter (Signed)
This encounter was created in error - please disregard.

## 2022-04-26 NOTE — Addendum Note (Signed)
Addended byClaiborne Rigg on: 04/26/2022 03:08 PM   Modules accepted: Orders, Level of Service

## 2022-04-30 ENCOUNTER — Other Ambulatory Visit: Payer: Self-pay | Admitting: Family Medicine

## 2022-04-30 DIAGNOSIS — I10 Essential (primary) hypertension: Secondary | ICD-10-CM

## 2022-04-30 DIAGNOSIS — E1165 Type 2 diabetes mellitus with hyperglycemia: Secondary | ICD-10-CM

## 2022-05-02 DIAGNOSIS — M25551 Pain in right hip: Secondary | ICD-10-CM | POA: Diagnosis not present

## 2022-05-12 ENCOUNTER — Ambulatory Visit: Payer: 59 | Admitting: Cardiology

## 2022-05-12 DIAGNOSIS — I493 Ventricular premature depolarization: Secondary | ICD-10-CM | POA: Diagnosis not present

## 2022-05-17 DIAGNOSIS — M25551 Pain in right hip: Secondary | ICD-10-CM | POA: Diagnosis not present

## 2022-05-18 ENCOUNTER — Encounter: Payer: Self-pay | Admitting: Family Medicine

## 2022-05-30 DIAGNOSIS — Z7984 Long term (current) use of oral hypoglycemic drugs: Secondary | ICD-10-CM | POA: Diagnosis not present

## 2022-05-30 DIAGNOSIS — E113293 Type 2 diabetes mellitus with mild nonproliferative diabetic retinopathy without macular edema, bilateral: Secondary | ICD-10-CM | POA: Diagnosis not present

## 2022-05-30 DIAGNOSIS — H2513 Age-related nuclear cataract, bilateral: Secondary | ICD-10-CM | POA: Diagnosis not present

## 2022-05-30 DIAGNOSIS — Z794 Long term (current) use of insulin: Secondary | ICD-10-CM | POA: Diagnosis not present

## 2022-06-06 ENCOUNTER — Ambulatory Visit (INDEPENDENT_AMBULATORY_CARE_PROVIDER_SITE_OTHER): Payer: 59 | Admitting: Family Medicine

## 2022-06-06 ENCOUNTER — Encounter: Payer: Self-pay | Admitting: Family Medicine

## 2022-06-06 VITALS — BP 116/76 | HR 64 | Temp 96.9°F | Resp 20 | Ht 70.0 in | Wt 337.0 lb

## 2022-06-06 DIAGNOSIS — M5416 Radiculopathy, lumbar region: Secondary | ICD-10-CM | POA: Diagnosis not present

## 2022-06-06 DIAGNOSIS — M25551 Pain in right hip: Secondary | ICD-10-CM

## 2022-06-06 DIAGNOSIS — I10 Essential (primary) hypertension: Secondary | ICD-10-CM | POA: Diagnosis not present

## 2022-06-06 DIAGNOSIS — G8929 Other chronic pain: Secondary | ICD-10-CM

## 2022-06-06 DIAGNOSIS — M5442 Lumbago with sciatica, left side: Secondary | ICD-10-CM | POA: Diagnosis not present

## 2022-06-06 DIAGNOSIS — E1165 Type 2 diabetes mellitus with hyperglycemia: Secondary | ICD-10-CM

## 2022-06-06 DIAGNOSIS — E782 Mixed hyperlipidemia: Secondary | ICD-10-CM | POA: Diagnosis not present

## 2022-06-06 DIAGNOSIS — K219 Gastro-esophageal reflux disease without esophagitis: Secondary | ICD-10-CM

## 2022-06-06 DIAGNOSIS — I700 Atherosclerosis of aorta: Secondary | ICD-10-CM | POA: Diagnosis not present

## 2022-06-06 DIAGNOSIS — M5441 Lumbago with sciatica, right side: Secondary | ICD-10-CM | POA: Diagnosis not present

## 2022-06-06 DIAGNOSIS — E039 Hypothyroidism, unspecified: Secondary | ICD-10-CM

## 2022-06-06 LAB — BAYER DCA HB A1C WAIVED: HB A1C (BAYER DCA - WAIVED): 7.2 % — ABNORMAL HIGH (ref 4.8–5.6)

## 2022-06-06 MED ORDER — OMEPRAZOLE 20 MG PO CPDR: 20.0000 mg | DELAYED_RELEASE_CAPSULE | Freq: Every day | ORAL | 1 refills | Status: DC

## 2022-06-06 MED ORDER — ATORVASTATIN CALCIUM 80 MG PO TABS: 80.0000 mg | ORAL_TABLET | Freq: Every day | ORAL | 1 refills | Status: DC

## 2022-06-06 MED ORDER — METFORMIN HCL ER 500 MG PO TB24: ORAL_TABLET | ORAL | 1 refills | Status: DC

## 2022-06-06 MED ORDER — SPIRONOLACTONE 25 MG PO TABS: 25.0000 mg | ORAL_TABLET | Freq: Every day | ORAL | 1 refills | Status: DC

## 2022-06-06 MED ORDER — GABAPENTIN 600 MG PO TABS: 600.0000 mg | ORAL_TABLET | Freq: Three times a day (TID) | ORAL | 1 refills | Status: DC

## 2022-06-06 MED ORDER — AMLODIPINE BESYLATE 10 MG PO TABS: 10.0000 mg | ORAL_TABLET | Freq: Every day | ORAL | 1 refills | Status: DC

## 2022-06-06 MED ORDER — LISINOPRIL 20 MG PO TABS: 20.0000 mg | ORAL_TABLET | Freq: Every day | ORAL | 1 refills | Status: DC

## 2022-06-06 MED ORDER — METFORMIN HCL 500 MG PO TABS: 500.0000 mg | ORAL_TABLET | Freq: Two times a day (BID) | ORAL | 1 refills | Status: DC

## 2022-06-06 NOTE — Assessment & Plan Note (Signed)
Well-controlled on current regimen. ?

## 2022-06-06 NOTE — Assessment & Plan Note (Signed)
Continue gabapetin TID.

## 2022-06-06 NOTE — Assessment & Plan Note (Signed)
Does not desire to continue pain medication.

## 2022-06-06 NOTE — Assessment & Plan Note (Signed)
Encouraged healthy eating and exercise

## 2022-06-06 NOTE — Assessment & Plan Note (Signed)
Patient to keep appointment tomorrow to discuss possible surgery.

## 2022-06-06 NOTE — Assessment & Plan Note (Signed)
Continue aspirin and statin. 

## 2022-06-06 NOTE — Progress Notes (Signed)
Assessment & Plan:  Essential hypertension Well controlled on current regimen.   Aortic atherosclerosis (HCC) Continue aspirin and statin.  GERD (gastroesophageal reflux disease) Well controlled on current regimen.   Type 2 diabetes mellitus with hyperglycemia, without long-term current use of insulin (HCC) A1c 7.2 which increased slightly from 7.1 three months ago. - Diabetes is not at goal of A1c < 7. - Medications: continue current medications, with an increase in Metformin from 500 mg BID to 1,000 mg in the AM and 500 mg in the PM - Patient is currently taking a statin. Patient is taking an ACE-inhibitor/ARB.  - Instruction/counseling given: discussed the need for weight loss  Diabetes Health Maintenance Due  Topic Date Due   OPHTHALMOLOGY EXAM  05/28/2022   HEMOGLOBIN A1C  09/01/2022   FOOT EXAM  06/07/2023    Lab Results  Component Value Date   LABMICR <3.0 11/30/2021   LABMICR See below: 08/25/2020    Acquired hypothyroidism Well controlled on current regimen.   Chronic midline low back pain with bilateral sciatica Does not desire to continue pain medication.  Lumbar nerve root impingement Continue gabapetin TID.  Hyperlipidemia Well controlled on current regimen.   Morbid obesity ([place]) Encouraged healthy eating and exercise.   Chronic right hip pain Patient to keep appointment tomorrow to discuss possible surgery.     Return in about 3 months (around 09/06/2022) for annual physical.  Daniel Limes, MSN, APRN, FNP-C [facility]  Subjective:    Patient ID: Daniel [facility], male    DOB: 02/24/1961, 61 y.o.   MRN: [id number]  Patient Care Team: Loman Brooklyn, FNP as PCP - General (Family Medicine) Berniece Salines, DO as PCP - Cardiology (Cardiology) Okey Regal, OD (Optometry)   Chief Complaint:  Chief Complaint  Patient presents with   Medical Management of Chronic Issues    3 mo     HPI: EMMERT Scott is a 61 y.o.  male presenting on 06/06/2022 for Medical Management of Chronic Issues (3 mo )  Diabetes: Patient presents for follow up of diabetes. Current symptoms include: none. Known diabetic complications: none. Medication compliance: yes. Current diet:  quit drinking soda; decreased carbs . Current exercise: none due to the amount of chronic pain he is experiencing. He is not checking his blood sugar at home. Is he  on ACE inhibitor or angiotensin II receptor blocker? Yes (Lisinopril). Is he on a statin? Yes (Atorvastatin).   Hyperlipidemia: taking atorvastatin.  Aortic Atherosclerosis: taking aspirin and atorvastatin.   GERD: taking omeprazole daily.   Pain assessment: Cause of pain- degenerative changes of back and right hip.   Lumbar spine x-ray on 10/26/2021: Diffuse multilevel degenerative change with multilevel disc degeneration, endplate osteophyte formation and facet hypertrophy.  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  Right hip x-ray 05/17/2022: moderate degenerative changes   Pain location- low back, right hip/leg Pain on scale of 1-10- 4/10 on average; sometimes up to 8-9/10 Frequency- daily What increases pain- walking, bending What makes pain better- sitting, resting Effects on ADL- makes difficult at times Any change in general medical condition- none   Current opioids rx- Norco 5/325 Q6H PRN # meds rx- 90 - however, he reports he has not taken one in the past month and just assumes taking it off his medication list. He is also taking gabapentin 600  mg three times daily. Previously failed treatment with Tramadol, Ibuprofen and Tizanidine. Effectiveness of current meds- not helpful so he quit taking the Tramadol Adverse reactions from pain meds- none He has an appointment with an orthopedic surgeon  scheduled for tomorrow to discuss potential surgery.  Pill count performed-No Last drug screen - 11/02/2021 ( high risk q47m moderate risk q673mlow risk yearly ) Urine drug screen today- No [place]- Yes  If yes were their any concerning findings? - No     11/02/2021    8:58 AM  Opioid Risk   Alcohol 0  Illegal Drugs 0  Rx Drugs 0  Alcohol 0  Illegal Drugs 0  Rx Drugs 0  Age between 16-45 years  0  History of Preadolescent Sexual Abuse 0  Psychological Disease 0  Depression 0  Opioid Risk Tool Scoring 0  Opioid Risk Interpretation Low Risk   Pain contract signed on: 11/02/2021    New complaints: None   Social history:  Relevant past medical, surgical, family and social history reviewed and updated as indicated. Interim medical history since our last visit reviewed.  Allergies and medications reviewed and updated.  DATA REVIEWED: CHART IN EPIC  ROS: Negative unless specifically indicated above in HPI.    Current Outpatient Medications:    amLODipine (NORVASC) 10 MG tablet, Take 1 tablet (10 mg total) by mouth daily., Disp: 90 tablet, Rfl: 1   APPLE CIDER VINEGAR PO, Take 450 mg by mouth 2 (two) times daily., Disp: , Rfl:    aspirin 325 MG EC tablet, Take 325 mg by mouth daily., Disp: , Rfl:    atorvastatin (LIPITOR) 80 MG tablet, Take 1 tablet (80 mg total) by mouth daily., Disp: 90 tablet, Rfl: 1   Cholecalciferol (DIALYVITE VITAMIN D 5000) 125 MCG (5000 UT) capsule, Take 5,000 Units by mouth daily., Disp: , Rfl:    CINNAMON PO, Take 1,000 mg by mouth 2 (two) times daily., Disp: , Rfl:    Cyanocobalamin (B-12) 5000 MCG CAPS, Take 5,000 mcg by mouth daily., Disp: , Rfl:    Dulaglutide (TRULICITY) 4.5 MGTG/2.5WLOPN, Inject 4.5 mg as directed once a week. (Patient taking differently: Inject 4.5 mg as directed once a week. Wednesday), Disp: 6 mL, Rfl: 1   empagliflozin (JARDIANCE) 25 MG TABS tablet, TAKE 1 TABLET BY MOUTH EVERY DAY BEFORE BREAKFAST (Patient  taking differently: Take 25 mg by mouth daily.), Disp: 90 tablet, Rfl: 1   gabapentin (NEURONTIN) 600 MG tablet, Take 1 tablet (600 mg total) by mouth 3 (three) times daily., Disp: 270 tablet, Rfl: 1   Garlic 108937G CAPS, Take 1,000 mg by mouth 2 (two) times daily., Disp: , Rfl:    HYDROcodone-acetaminophen (NORCO) 5-325 MG tablet, Take 1 tablet by mouth every 6 (six) hours as needed for moderate pain., Disp: 90 tablet, Rfl: 0   Krill Oil 500 MG CAPS, Take 500 mg by mouth daily., Disp: , Rfl:    levothyroxine (SYNTHROID) 75 MCG tablet, TAKE 1 TABLET BY MOUTH EVERY DAY BEFORE BREAKFAST (Patient taking differently: Take 75 mcg by mouth daily before breakfast.), Disp: 90 tablet, Rfl: 1   lisinopril (ZESTRIL) 20 MG tablet, Take 1 tablet (20 mg total) by mouth daily., Disp: 90 tablet, Rfl: 1   metFORMIN (GLUCOPHAGE) 500 MG tablet, TAKE 1 TABLET BY MOUTH TWICE A DAY WITH FOOD, Disp: 180 tablet, Rfl: 0   metoprolol succinate (TOPROL-XL) 25 MG 24 hr tablet, Take 0.5 tablets (12.5 mg total) by mouth  daily. Take with or immediately following a meal., Disp: 45 tablet, Rfl: 3   Multiple Vitamin (MULTIVITAMIN ADULT PO), Take 1 tablet by mouth daily. , Disp: , Rfl:    olopatadine (PATANOL) 0.1 % ophthalmic solution, Place 1 drop into both eyes daily as needed for allergies., Disp: , Rfl:    omeprazole (PRILOSEC) 20 MG capsule, Take 1 capsule (20 mg total) by mouth daily., Disp: 90 capsule, Rfl: 1   Propylene Glycol (SYSTANE BALANCE) 0.6 % SOLN, Place 1 drop into both eyes daily as needed (dry eyes)., Disp: , Rfl:    psyllium (HYDROCIL/METAMUCIL) 95 % PACK, Take 1 packet by mouth daily., Disp: , Rfl:    sildenafil (VIAGRA) 100 MG tablet, Take 1 tablet (100 mg total) by mouth daily as needed for erectile dysfunction., Disp: 30 tablet, Rfl: 2   spironolactone (ALDACTONE) 25 MG tablet, TAKE 1 TABLET (25 MG TOTAL) BY MOUTH DAILY., Disp: 90 tablet, Rfl: 0   Berberine Chloride (BERBERINE HCI PO), Take 400 mg by mouth 2  (two) times daily., Disp: , Rfl:    HYDROcodone-acetaminophen (NORCO) 5-325 MG tablet, Take 1 tablet by mouth every 6 (six) hours as needed for moderate pain. (Patient not taking: Reported on 06/06/2022), Disp: 90 tablet, Rfl: 0   nitroGLYCERIN (NITROSTAT) 0.4 MG SL tablet, Place 1 tablet (0.4 mg total) under the tongue every 5 (five) minutes as needed for chest pain. (Patient not taking: Reported on 06/06/2022), Disp: 25 tablet, Rfl: 3   Allergies  Allergen Reactions   Sulfa Antibiotics    Past Medical History:  Diagnosis Date   Arthritis    hands and feet   Depression    Diabetes mellitus without complication ([place])    type 2   Fatty liver    GERD (gastroesophageal reflux disease)    Hiatal hernia    Hyperlipidemia    Hypertension    Hypothyroidism    Neuromuscular disorder ([place])    stenosis   Pneumonia 1990   Stenosis of cervical spine    Thyroid disease     Past Surgical History:  Procedure Laterality Date   ANTERIOR CERVICAL DECOMP/DISCECTOMY FUSION N/A 06/02/2020   Procedure: ANTERIOR CERVICAL DECOMPRESSION/DISCECTOMY FUSIONCERVICAL FOUR- CERVICAL FIVE, CERVICAL FIVE- CERVICAL SIX;  Surgeon: Dawley, Theodoro Doing, DO;  Location: [place];  Service: Neurosurgery;  Laterality: N/A;  ANTERIOR CERVICAL DECOMPRESSION/DISCECTOMY FUSIONCERVICAL FOUR- CERVICAL FIVE, CERVICAL FIVE- CERVICAL SIX   CARDIAC CATHETERIZATION  1996; 2006   [facility]   HYDROCELE EXCISION     LAPAROSCOPIC GASTRIC BANDING     LAPAROSCOPIC ROUX-EN-Y GASTRIC BYPASS WITH UPPER ENDOSCOPY AND REMOVAL OF LAP BAND  02/2012   LEFT HEART CATH AND CORONARY ANGIOGRAPHY N/A 04/21/2022   Procedure: LEFT HEART CATH AND CORONARY ANGIOGRAPHY;  Surgeon: Belva Crome, MD;  Location: [place] [facility];  Service: Cardiovascular;  Laterality: N/A;   TONSILLECTOMY      Social History   Socioeconomic History   Marital status: Married    Spouse name: Not on file   Number of children: Not on file   Years of  education: Not on file   Highest education level: Not on file  Occupational History   Not on file  Tobacco Use   Smoking status: Former    Types: Cigarettes    Quit date: 05/2009    Years since quitting: 13.0   Smokeless tobacco: Never  Vaping Use   Vaping Use: Never used  Substance and Sexual Activity   Alcohol use: Yes  Comment: Couple times a week   Drug use: Yes    Types: Marijuana    Comment: occ   Sexual activity: Yes    Birth control/protection: None  Other Topics Concern   Not on file  Social History Narrative   Not on file   Social Determinants of Health   Financial Resource Strain: Not on file  Food Insecurity: Not on file  Transportation Needs: Not on file  Physical Activity: Not on file  Stress: Not on file  Social Connections: Not on file  Intimate Partner Violence: Not on file        Objective:    BP 116/76   Pulse 64   Temp (!) 96.9 F (36.1 C)   Resp 20   Ht 5' 10"  (1.778 m)   Wt (!) 337 lb (152.9 kg)   SpO2 95%   BMI 48.35 kg/m   Wt Readings from Last 3 Encounters:  06/06/22 (!) 337 lb (152.9 kg)  04/21/22 (!) 336 lb (152.4 kg)  04/14/22 (!) 336 lb 12.8 oz (152.8 kg)    Physical Exam Vitals reviewed.  Constitutional:      General: He is not in acute distress.    Appearance: Normal appearance. He is morbidly obese. He is not ill-appearing, toxic-appearing or diaphoretic.  HENT:     Head: Normocephalic and atraumatic.  Eyes:     General: No scleral icterus.       Right eye: No discharge.        Left eye: No discharge.     Conjunctiva/sclera: Conjunctivae normal.  Cardiovascular:     Rate and Rhythm: Normal rate and regular rhythm.     Heart sounds: Normal heart sounds. No murmur heard.    No friction rub. No gallop.  Pulmonary:     Effort: Pulmonary effort is normal. No respiratory distress.     Breath sounds: Normal breath sounds. No stridor. No wheezing, rhonchi or rales.  Musculoskeletal:        General: Normal range of  motion.     Cervical back: Normal range of motion.  Skin:    General: Skin is warm and dry.  Neurological:     Mental Status: He is alert and oriented to person, place, and time. Mental status is at baseline.  Psychiatric:        Mood and Affect: Mood normal.        Behavior: Behavior normal.        Thought Content: Thought content normal.        Judgment: Judgment normal.     Lab Results  Component Value Date   TSH 1.450 11/30/2021   Lab Results  Component Value Date   WBC 6.3 04/14/2022   HGB 14.3 04/21/2022   HCT 42.0 04/21/2022   MCV 93 04/14/2022   PLT 265 04/14/2022   Lab Results  Component Value Date   NA 140 04/21/2022   K 4.6 04/21/2022   CO2 22 04/14/2022   GLUCOSE 119 (H) 04/21/2022   BUN 17 04/21/2022   CREATININE 0.80 04/21/2022   BILITOT 0.3 03/02/2022   ALKPHOS 77 03/02/2022   AST 29 03/02/2022   ALT 47 (H) 03/02/2022   PROT 6.7 03/02/2022   ALBUMIN 4.7 03/02/2022   CALCIUM 9.9 04/14/2022   ANIONGAP 7 03/23/2022   EGFR 100 04/14/2022   Lab Results  Component Value Date   CHOL 121 03/02/2022   Lab Results  Component Value Date   HDL 39 (L) 03/02/2022  Lab Results  Component Value Date   LDLCALC 61 03/02/2022   Lab Results  Component Value Date   TRIG 114 03/02/2022   Lab Results  Component Value Date   CHOLHDL 3.1 03/02/2022   Lab Results  Component Value Date   HGBA1C 7.1 (H) 03/02/2022

## 2022-06-06 NOTE — Telephone Encounter (Signed)
Processed the transfer to Marjorie Smolder, NP

## 2022-06-06 NOTE — Assessment & Plan Note (Signed)
A1c 7.2 which increased slightly from 7.1 three months ago. - Diabetes is not at goal of A1c < 7. - Medications: continue current medications, with an increase in Metformin from 500 mg BID to 1,000 mg in the AM and 500 mg in the PM - Patient is currently taking a statin. Patient is taking an ACE-inhibitor/ARB.  - Instruction/counseling given: discussed the need for weight loss  Diabetes Health Maintenance Due  Topic Date Due  . OPHTHALMOLOGY EXAM  05/28/2022  . HEMOGLOBIN A1C  09/01/2022  . FOOT EXAM  06/07/2023    Lab Results  Component Value Date   LABMICR <3.0 11/30/2021   LABMICR See below: 08/25/2020

## 2022-06-07 ENCOUNTER — Ambulatory Visit: Payer: 59 | Admitting: Family Medicine

## 2022-06-07 DIAGNOSIS — M1611 Unilateral primary osteoarthritis, right hip: Secondary | ICD-10-CM | POA: Diagnosis not present

## 2022-06-07 LAB — CMP14+EGFR
ALT: 41 IU/L (ref 0–44)
AST: 32 IU/L (ref 0–40)
Albumin/Globulin Ratio: 2.4 — ABNORMAL HIGH (ref 1.2–2.2)
Albumin: 4.4 g/dL (ref 3.9–4.9)
Alkaline Phosphatase: 76 IU/L (ref 44–121)
BUN/Creatinine Ratio: 19 (ref 10–24)
BUN: 15 mg/dL (ref 8–27)
Bilirubin Total: 0.3 mg/dL (ref 0.0–1.2)
CO2: 23 mmol/L (ref 20–29)
Calcium: 9.5 mg/dL (ref 8.6–10.2)
Chloride: 99 mmol/L (ref 96–106)
Creatinine, Ser: 0.77 mg/dL (ref 0.76–1.27)
Globulin, Total: 1.8 g/dL (ref 1.5–4.5)
Glucose: 168 mg/dL — ABNORMAL HIGH (ref 70–99)
Potassium: 5.1 mmol/L (ref 3.5–5.2)
Sodium: 137 mmol/L (ref 134–144)
Total Protein: 6.2 g/dL (ref 6.0–8.5)
eGFR: 101 mL/min/{1.73_m2} (ref >59)

## 2022-06-07 LAB — CBC WITH DIFFERENTIAL/PLATELET
Basophils Absolute: 0 10*3/uL (ref 0.0–0.2)
Basos: 1 %
EOS (ABSOLUTE): 0.1 10*3/uL (ref 0.0–0.4)
Eos: 2 %
Hematocrit: 43 % (ref 37.5–51.0)
Hemoglobin: 14.6 g/dL (ref 13.0–17.7)
Immature Grans (Abs): 0 10*3/uL (ref 0.0–0.1)
Immature Granulocytes: 0 %
Lymphocytes Absolute: 1.3 10*3/uL (ref 0.7–3.1)
Lymphs: 26 %
MCH: 31.6 pg (ref 26.6–33.0)
MCHC: 34 g/dL (ref 31.5–35.7)
MCV: 93 fL (ref 79–97)
Monocytes Absolute: 0.5 10*3/uL (ref 0.1–0.9)
Monocytes: 11 %
Neutrophils Absolute: 3 10*3/uL (ref 1.4–7.0)
Neutrophils: 60 %
Platelets: 260 10*3/uL (ref 150–450)
RBC: 4.62 x10E6/uL (ref 4.14–5.80)
RDW: 13.3 % (ref 11.6–15.4)
WBC: 4.9 10*3/uL (ref 3.4–10.8)

## 2022-06-07 LAB — LIPID PANEL
Chol/HDL Ratio: 3.4 ratio (ref 0.0–5.0)
Cholesterol, Total: 136 mg/dL (ref 100–199)
HDL: 40 mg/dL (ref >39)
LDL Chol Calc (NIH): 73 mg/dL (ref 0–99)
Triglycerides: 132 mg/dL (ref 0–149)
VLDL Cholesterol Cal: 23 mg/dL (ref 5–40)

## 2022-06-07 LAB — VITAMIN B12: Vitamin B-12: >2000 pg/mL — ABNORMAL HIGH (ref 232–1245)

## 2022-06-07 LAB — TSH: TSH: 1.43 u[IU]/mL (ref 0.450–4.500)

## 2022-06-10 ENCOUNTER — Encounter: Payer: 59 | Admitting: Cardiology

## 2022-06-13 ENCOUNTER — Encounter: Payer: Self-pay | Admitting: Family Medicine

## 2022-06-21 ENCOUNTER — Encounter: Payer: Self-pay | Admitting: Cardiology

## 2022-06-21 ENCOUNTER — Ambulatory Visit: Payer: 59 | Attending: Cardiology | Admitting: Cardiology

## 2022-06-21 VITALS — BP 118/70 | HR 93 | Ht 70.0 in | Wt 339.2 lb

## 2022-06-21 DIAGNOSIS — I493 Ventricular premature depolarization: Secondary | ICD-10-CM | POA: Diagnosis not present

## 2022-06-21 DIAGNOSIS — I4729 Other ventricular tachycardia: Secondary | ICD-10-CM

## 2022-06-21 DIAGNOSIS — I10 Essential (primary) hypertension: Secondary | ICD-10-CM

## 2022-06-21 DIAGNOSIS — E1165 Type 2 diabetes mellitus with hyperglycemia: Secondary | ICD-10-CM

## 2022-06-21 NOTE — Patient Instructions (Signed)
Medication Instructions:  Your physician recommends that you continue on your current medications as directed. Please refer to the Current Medication list given to you today.  *If you need a refill on your cardiac medications before your next appointment, please call your pharmacy*   Lab Work: NONE If you have labs (blood work) drawn today and your tests are completely normal, you will receive your results only by: MyChart Message (if you have MyChart) OR A paper copy in the mail If you have any lab test that is abnormal or we need to change your treatment, we will call you to review the results.   Testing/Procedures: NONE   Follow-Up: At [facility], you and your health needs are our priority.  As part of our continuing mission to provide you with exceptional heart care, we have created designated Provider Care Teams.  These Care Teams include your primary Cardiologist (physician) and Advanced Practice Providers (APPs -  Physician Assistants and Nurse Practitioners) who all work together to provide you with the care you need, when you need it.  We recommend signing up for the patient portal called "MyChart".  Sign up information is provided on this After Visit Summary.  MyChart is used to connect with patients for Virtual Visits (Telemedicine).  Patients are able to view lab/test results, encounter notes, upcoming appointments, etc.  Non-urgent messages can be sent to your provider as well.   To learn more about what you can do with MyChart, go to [email or web address].    Your next appointment:   6 month(s)  The format for your next appointment:   In Person  Provider:   Kardie Tobb, DO   

## 2022-06-21 NOTE — Progress Notes (Unsigned)
Cardiology Office Note:    Date:  06/23/2022   ID:  Particia Scott, DOB 1961/03/29, MRN [id number]  PCP:  Gwenlyn Perking, FNP  Cardiologist:  Berniece Salines, DO  Electrophysiologist:  None   Referring MD: Loman Brooklyn, FNP   " I am experiencing chest pain"  History of Present Illness:    Daniel Scott is a 61 y.o. male with a hx of hypertension, hyperlipidemia, diabetes mellitus hemoglobin A1c 7.1 which was done on March 02, 2022, minimal coronary artery disease on heart catheterization which was done back in 2006, recent heart catheterization did not show any evidence of coronary artery disease, recent ZIO monitor showed evidence of symptomatic NSVT, frequent PVCs as well as paroxysmal SVT morbid obesity and depression here today for follow-up visit.  At his last visit he also reported that he was experiencing significant PVCs.    Since I saw the patient he has heart catheterization which did not show any coronary artery disease.  He has had his surgery.  He is doing well.  Since being on the beta-blocker he has had significant improvement.  Past Medical History:  Diagnosis Date   Arthritis    hands and feet   Depression    Diabetes mellitus without complication ([place])    type 2   Fatty liver    GERD (gastroesophageal reflux disease)    Hiatal hernia    Hyperlipidemia    Hypertension    Hypothyroidism    Neuromuscular disorder ([place])    stenosis   Pneumonia 1990   Stenosis of cervical spine    Thyroid disease     Past Surgical History:  Procedure Laterality Date   ANTERIOR CERVICAL DECOMP/DISCECTOMY FUSION N/A 06/02/2020   Procedure: ANTERIOR CERVICAL DECOMPRESSION/DISCECTOMY FUSIONCERVICAL FOUR- CERVICAL FIVE, CERVICAL FIVE- CERVICAL SIX;  Surgeon: Dawley, Theodoro Doing, DO;  Location: [facility];  Service: Neurosurgery;  Laterality: N/A;  ANTERIOR CERVICAL DECOMPRESSION/DISCECTOMY FUSIONCERVICAL FOUR- CERVICAL FIVE, CERVICAL FIVE- CERVICAL SIX   CARDIAC CATHETERIZATION  1996; 2006    [facility]   HYDROCELE EXCISION     LAPAROSCOPIC GASTRIC BANDING     LAPAROSCOPIC ROUX-EN-Y GASTRIC BYPASS WITH UPPER ENDOSCOPY AND REMOVAL OF LAP BAND  02/2012   LEFT HEART CATH AND CORONARY ANGIOGRAPHY N/A 04/21/2022   Procedure: LEFT HEART CATH AND CORONARY ANGIOGRAPHY;  Surgeon: Belva Crome, MD;  Location: [place] [facility];  Service: Cardiovascular;  Laterality: N/A;   TONSILLECTOMY      Current Medications: Current Meds  Medication Sig   amLODipine (NORVASC) 10 MG tablet Take 1 tablet (10 mg total) by mouth daily.   APPLE CIDER VINEGAR PO Take 450 mg by mouth 2 (two) times daily.   aspirin 325 MG EC tablet Take 325 mg by mouth daily.   atorvastatin (LIPITOR) 80 MG tablet Take 1 tablet (80 mg total) by mouth daily.   Berberine Chloride (BERBERINE HCI PO) Take 400 mg by mouth 2 (two) times daily.   Cholecalciferol (DIALYVITE VITAMIN D 5000) 125 MCG (5000 UT) capsule Take 5,000 Units by mouth daily.   CINNAMON PO Take 1,000 mg by mouth 2 (two) times daily.   Dulaglutide (TRULICITY) 4.5 VE/9.3YB SOPN Inject 4.5 mg as directed once a week. (Patient taking differently: Inject 4.5 mg as directed once a week. Wednesday)   empagliflozin (JARDIANCE) 25 MG TABS tablet TAKE 1 TABLET BY MOUTH EVERY DAY BEFORE BREAKFAST (Patient taking differently: Take 25 mg by mouth daily.)   gabapentin (NEURONTIN) 600 MG tablet  Take 1 tablet (600 mg total) by mouth 3 (three) times daily.   Garlic 1000 MG CAPS Take 1,000 mg by mouth 2 (two) times daily.   Krill Oil 500 MG CAPS Take 500 mg by mouth daily.   levothyroxine (SYNTHROID) 75 MCG tablet TAKE 1 TABLET BY MOUTH EVERY DAY BEFORE BREAKFAST (Patient taking differently: Take 75 mcg by mouth daily before breakfast.)   lisinopril (ZESTRIL) 20 MG tablet Take 1 tablet (20 mg total) by mouth daily.   metFORMIN (GLUCOPHAGE-XR) 500 MG 24 hr tablet Take 2 tablets (1,000 mg total) by mouth daily with breakfast AND 1 tablet (500 mg total) daily  after supper.   metoprolol succinate (TOPROL-XL) 25 MG 24 hr tablet Take 0.5 tablets (12.5 mg total) by mouth daily. Take with or immediately following a meal.   Multiple Vitamin (MULTIVITAMIN ADULT PO) Take 1 tablet by mouth daily.    nitroGLYCERIN (NITROSTAT) 0.4 MG SL tablet Place 1 tablet (0.4 mg total) under the tongue every 5 (five) minutes as needed for chest pain.   olopatadine (PATANOL) 0.1 % ophthalmic solution Place 1 drop into both eyes daily as needed for allergies.   omeprazole (PRILOSEC) 20 MG capsule Take 1 capsule (20 mg total) by mouth daily.   Propylene Glycol (SYSTANE BALANCE) 0.6 % SOLN Place 1 drop into both eyes daily as needed (dry eyes).   psyllium (HYDROCIL/METAMUCIL) 95 % PACK Take 1 packet by mouth daily.   sildenafil (VIAGRA) 100 MG tablet Take 1 tablet (100 mg total) by mouth daily as needed for erectile dysfunction.   spironolactone (ALDACTONE) 25 MG tablet Take 1 tablet (25 mg total) by mouth daily.     Allergies:   Sulfa antibiotics   Social History   Socioeconomic History   Marital status: Married    Spouse name: Not on file   Number of children: Not on file   Years of education: Not on file   Highest education level: Not on file  Occupational History   Not on file  Tobacco Use   Smoking status: Former    Types: Cigarettes    Quit date: 05/2009    Years since quitting: 13.1   Smokeless tobacco: Never  Vaping Use   Vaping Use: Never used  Substance and Sexual Activity   Alcohol use: Yes    Comment: Couple times a week   Drug use: Yes    Types: Marijuana    Comment: occ   Sexual activity: Yes    Birth control/protection: None  Other Topics Concern   Not on file  Social History Narrative   Not on file   Social Determinants of Health   Financial Resource Strain: Not on file  Food Insecurity: Not on file  Transportation Needs: Not on file  Physical Activity: Not on file  Stress: Not on file  Social Connections: Not on file     Family  History: The patient's family history includes Anxiety disorder in his son; Autism in his son and son; Bipolar disorder in his son; Breast cancer in his maternal grandmother; Cancer in his mother; Colon cancer in his father; Coronary artery disease in his father; Depression in his son; Diabetes in his maternal grandfather and mother; Heart disease in his father, maternal grandfather, and paternal grandfather; Hyperlipidemia in his father; Hypertension in his father, maternal grandfather, maternal grandmother, mother, paternal grandfather, paternal grandmother, and son; Kidney disease in his mother; Lung cancer in his maternal grandfather; Spina bifida in his daughter; Stroke in his maternal  grandfather and maternal grandmother; Thyroid disease in his mother; Vascular Disease in his mother.  ROS:   Review of Systems  Constitution: Reports fatigue, snoring and daytime somnolence.  Negative for decreased appetite, fever and weight gain.  HENT: Negative for congestion, ear discharge, hoarse voice and sore throat.   Eyes: Negative for discharge, redness, vision loss in right eye and visual halos.  Cardiovascular: Reports chest pain, dyspnea on exertion, and palpitation.  Negative for leg swelling, orthopnea. Respiratory: Negative for cough, hemoptysis, shortness of breath.   Endocrine: Negative for heat intolerance and polyphagia.  Hematologic/Lymphatic: Negative for bleeding problem. Does not bruise/bleed easily.  Skin: Negative for flushing, nail changes, rash and suspicious lesions.  Musculoskeletal: Negative for arthritis, joint pain, muscle cramps, myalgias, neck pain and stiffness.  Gastrointestinal: Negative for abdominal pain, bowel incontinence, diarrhea and excessive appetite.  Genitourinary: Negative for decreased libido, genital sores and incomplete emptying.  Neurological: Negative for brief paralysis, focal weakness, headaches and loss of balance.  Psychiatric/Behavioral: Negative for  altered mental status, depression and suicidal ideas.  Allergic/Immunologic: Negative for HIV exposure and persistent infections.    EKGs/Labs/Other Studies Reviewed:    The following studies were reviewed today:   EKG: None today  Left heart catheterization CONCLUSIONS: Right dominant coronary anatomy Normal/widely patent left main, LAD, circumflex, and RCA. Normal systolic function with EF 55% and LVEDP 21 mmHg.  Findings are consistent with chronic diastolic heart failure. Right heart cath not performed because of technical difficulties (access site sheath pulled before the procedure could be done).   RECOMMENDATIONS:   Further work-up per Dr. Servando Salina. This study would clear the patient for orthopedic surgery that may be upcoming.  He does not have critical coronary disease.   ZIO monitor 05/16/2022 Patch Wear Time:  12 days and 8 hours (2023-08-18T09:50:41-0400 to 2023-08-30T18:25:26-0400)   Patient had a min HR of 48 bpm, max HR of 235 bpm, and avg HR of 77 bpm. Predominant underlying rhythm was Sinus Rhythm. First Degree AV Block was present. 1 run of Ventricular Tachycardia occurred lasting 4 beats with a max rate of 235 bpm (avg 224  bpm). 5 Supraventricular Tachycardia runs occurred, the run with the fastest interval lasting 13 beats with a max rate of 182 bpm (avg 161 bpm); the run with the fastest interval was also the longest. Isolated SVEs were rare (<1.0%), SVE Couplets were  rare (<1.0%), and SVE Triplets were rare (<1.0%). Isolated VEs were frequent (8.3%, 112004), VE Couplets were rare (<1.0%, 54), and VE Triplets were rare (<1.0%, 5). Ventricular Bigeminy and Trigeminy were present.   Symptoms is associated with ventricular ectopy.   Conclusion: This study is remarkable for the following:                       1.  Symptomatic sustained ventricular tachycardia.                       2.  Frequent premature ventricular complex (8.3%, 112004).                       3.   Paroxysmal supraventricular tachycardia.    Recent Labs: 06/06/2022: ALT 41; BUN 15; Creatinine, Ser 0.77; Hemoglobin 14.6; Platelets 260; Potassium 5.1; Sodium 137; TSH 1.430  Recent Lipid Panel    Component Value Date/Time   CHOL 136 06/06/2022 0805   TRIG 132 06/06/2022 0805   HDL 40 06/06/2022 0805   CHOLHDL  3.4 06/06/2022 0805   LDLCALC 73 06/06/2022 0805    Physical Exam:    VS:  BP 118/70   Pulse 93   Ht 5\' 10"  (1.778 m)   Wt (!) 339 lb 3.2 oz (153.9 kg)   SpO2 96%   BMI 48.67 kg/m     Wt Readings from Last 3 Encounters:  06/21/22 (!) 339 lb 3.2 oz (153.9 kg)  06/06/22 (!) 337 lb (152.9 kg)  04/21/22 (!) 336 lb (152.4 kg)     GEN: Well nourished, well developed in no acute distress HEENT: Normal NECK: No JVD; No carotid bruits LYMPHATICS: No lymphadenopathy CARDIAC: S1S2 noted,RRR, no murmurs, rubs, gallops RESPIRATORY:  Clear to auscultation without rales, wheezing or rhonchi  ABDOMEN: Soft, non-tender, non-distended, +bowel sounds, no guarding. EXTREMITIES: No edema, No cyanosis, no clubbing MUSCULOSKELETAL:  No deformity  SKIN: Warm and dry NEUROLOGIC:  Alert and oriented x 3, non-focal PSYCHIATRIC:  Normal affect, good insight  ASSESSMENT:    1. Essential hypertension   2. Type 2 diabetes mellitus with hyperglycemia, without long-term current use of insulin (HCC)   3. PVC (premature ventricular contraction)   4. Frequent PVCs   5. NSVT (nonsustained ventricular tachycardia) (HCC)     PLAN:    His Did not show any evidence of coronary artery disease.  From that regards he can be cleared for his upcoming surgery. The patient does not have any unstable cardiac conditions.  Upon evaluation today, he can achieve 4 METs or greater without anginal symptoms.  According to [facility] and AHA guidelines, he requires no further cardiac workup prior to his noncardiac surgery and should be at acceptable risk.  Our service is available as necessary in the perioperative  period.  Blood pressure is acceptable, continue with current antihypertensive regimen.   The patient understands the need to lose weight with diet and exercise. We have discussed specific strategies for this.  He has had relief since starting his Toprol-XL we will continue this medication.  The patient understands the need to lose weight with diet and exercise. We have discussed specific strategies for this.   The patient is in agreement with the above plan. The patient left the office in stable condition.  The patient will follow up in 6 weeks.   Medication Adjustments/Labs and Tests Ordered: Current medicines are reviewed at length with the patient today.  Concerns regarding medicines are outlined above.  No orders of the defined types were placed in this encounter.  No orders of the defined types were placed in this encounter.   Patient Instructions  Medication Instructions:  Your physician recommends that you continue on your current medications as directed. Please refer to the Current Medication list given to you today.  *If you need a refill on your cardiac medications before your next appointment, please call your pharmacy*   Lab Work: NONE If you have labs (blood work) drawn today and your tests are completely normal, you will receive your results only by: MyChart Message (if you have MyChart) OR A paper copy in the mail If you have any lab test that is abnormal or we need to change your treatment, we will call you to review the results.   Testing/Procedures: NONE   Follow-Up: At [facility], you and your health needs are our priority.  As part of our continuing mission to provide you with exceptional heart care, we have created designated Provider Care Teams.  These Care Teams include your primary Cardiologist (physician) and  Advanced Practice Providers (APPs -  Physician Assistants and Nurse Practitioners) who all work together to provide you with the care  you need, when you need it.  We recommend signing up for the patient portal called "MyChart".  Sign up information is provided on this After Visit Summary.  MyChart is used to connect with patients for Virtual Visits (Telemedicine).  Patients are able to view lab/test results, encounter notes, upcoming appointments, etc.  Non-urgent messages can be sent to your provider as well.   To learn more about what you can do with MyChart, go to [email or web address].    Your next appointment:   6 month(s)  The format for your next appointment:   In Person  Provider:   Thomasene Ripple, DO      Adopting a Healthy Lifestyle.  Know what a healthy weight is for you (roughly BMI <25) and aim to maintain this   Aim for 7+ servings of fruits and vegetables daily   65-80+ fluid ounces of water or unsweet tea for healthy kidneys   Limit to max 1 drink of alcohol per day; avoid smoking/tobacco   Limit animal fats in diet for cholesterol and heart health - choose grass fed whenever available   Avoid highly processed foods, and foods high in saturated/trans fats   Aim for low stress - take time to unwind and care for your mental health   Aim for 150 min of moderate intensity exercise weekly for heart health, and weights twice weekly for bone health   Aim for 7-9 hours of sleep daily   When it comes to diets, agreement about the perfect plan isnt easy to find, even among the experts. Experts at the [facility] of [employer] developed an idea known as the Healthy Eating Plate. Just imagine a plate divided into logical, healthy portions.   The emphasis is on diet quality:   Load up on vegetables and fruits - one-half of your plate: Aim for color and variety, and remember that potatoes dont count.   Go for whole grains - one-quarter of your plate: Whole wheat, barley, wheat berries, quinoa, oats, brown rice, and foods made with them. If you want pasta, go with whole wheat pasta.   Protein  power - one-quarter of your plate: Fish, chicken, beans, and nuts are all healthy, versatile protein sources. Limit red meat.   The diet, however, does go beyond the plate, offering a few other suggestions.   Use healthy plant oils, such as olive, canola, soy, corn, sunflower and peanut. Check the labels, and avoid partially hydrogenated oil, which have unhealthy trans fats.   If youre thirsty, drink water. Coffee and tea are good in moderation, but skip sugary drinks and limit milk and dairy products to one or two daily servings.   The type of carbohydrate in the diet is more important than the amount. Some sources of carbohydrates, such as vegetables, fruits, whole grains, and beans-are healthier than others.   Finally, stay active  Signed, Thomasene Ripple, DO  06/23/2022 10:54 PM    [facility]

## 2022-06-28 ENCOUNTER — Encounter: Payer: Self-pay | Admitting: Family Medicine

## 2022-06-28 DIAGNOSIS — G8929 Other chronic pain: Secondary | ICD-10-CM

## 2022-07-18 ENCOUNTER — Encounter: Payer: Self-pay | Admitting: Cardiology

## 2022-07-18 ENCOUNTER — Other Ambulatory Visit: Payer: Self-pay | Admitting: Family Medicine

## 2022-07-18 DIAGNOSIS — E039 Hypothyroidism, unspecified: Secondary | ICD-10-CM

## 2022-07-20 ENCOUNTER — Ambulatory Visit: Payer: 59 | Admitting: Orthopaedic Surgery

## 2022-08-01 ENCOUNTER — Ambulatory Visit (INDEPENDENT_AMBULATORY_CARE_PROVIDER_SITE_OTHER): Payer: 59

## 2022-08-01 ENCOUNTER — Encounter: Payer: Self-pay | Admitting: Family Medicine

## 2022-08-01 ENCOUNTER — Other Ambulatory Visit: Payer: Self-pay

## 2022-08-01 ENCOUNTER — Ambulatory Visit (INDEPENDENT_AMBULATORY_CARE_PROVIDER_SITE_OTHER): Payer: 59 | Admitting: Orthopaedic Surgery

## 2022-08-01 ENCOUNTER — Encounter: Payer: Self-pay | Admitting: Orthopaedic Surgery

## 2022-08-01 VITALS — Ht 70.0 in | Wt 341.4 lb

## 2022-08-01 DIAGNOSIS — M7061 Trochanteric bursitis, right hip: Secondary | ICD-10-CM | POA: Diagnosis not present

## 2022-08-01 DIAGNOSIS — M25551 Pain in right hip: Secondary | ICD-10-CM

## 2022-08-01 NOTE — Progress Notes (Signed)
The patient is someone I am seeing for the first time.  He is here to determine whether or not he is a candidate for hip replacement surgery.  He said he was actually scheduled for hip replacement in [place] but then he was out of network from an insurance standpoint.  He is someone who has had gastric type of bariatric surgery in the past.  He said at his highest weight he was around 380 or more pounds.  Today he is 341 pounds.  His BMI is 48.99.  He is also diabetic.  His last hemoglobin A1c was 7.2.  He does report groin pain.  He had his lumbar spine worked up before and seen by 2 different back specialist who did not feel it was a spine issue.  Both hips move smoothly and fluidly and there is no blocks to rotation but there is some groin pain on the right side.  AP pelvis and lateral the right hip still shows a well-maintained joint space.  Some of is difficult to see due to his body habitus.  He does have significant truncal obesity.  I would like to obtain an MRI of his right hip to really better assess the cartilage.  Regardless, he is at a weight where we are not allowed to schedule surgery since his BMI is really 49 and he is part of a bundle program that does not allow [place] to proceed with surgery until BMI is below 40.  I did try to explain that to him as well.  Regardless, I do feel it is worth obtaining an MRI of the right hip so we can better assess what his cartilage looks like.  Have still advocated weight loss is much as possible.

## 2022-08-02 ENCOUNTER — Encounter: Payer: Self-pay | Admitting: Orthopaedic Surgery

## 2022-08-03 ENCOUNTER — Encounter: Payer: Self-pay | Admitting: Orthopaedic Surgery

## 2022-08-11 ENCOUNTER — Other Ambulatory Visit: Payer: 59

## 2022-08-21 ENCOUNTER — Ambulatory Visit
Admission: RE | Admit: 2022-08-21 | Discharge: 2022-08-21 | Disposition: A | Discharge status: Home / Self Care | Payer: 59 | Source: Ambulatory Visit | Attending: Orthopaedic Surgery | Admitting: Orthopaedic Surgery

## 2022-08-21 DIAGNOSIS — M25551 Pain in right hip: Secondary | ICD-10-CM | POA: Diagnosis not present

## 2022-08-21 DIAGNOSIS — M7061 Trochanteric bursitis, right hip: Secondary | ICD-10-CM

## 2022-08-22 ENCOUNTER — Ambulatory Visit: Payer: 59 | Admitting: Physician Assistant

## 2022-08-23 ENCOUNTER — Encounter: Payer: Self-pay | Admitting: Family Medicine

## 2022-08-24 ENCOUNTER — Ambulatory Visit: Payer: 59 | Admitting: Physician Assistant

## 2022-08-24 ENCOUNTER — Encounter: Payer: Self-pay | Admitting: Physician Assistant

## 2022-08-24 ENCOUNTER — Other Ambulatory Visit: Payer: Self-pay | Admitting: Physician Assistant

## 2022-08-24 DIAGNOSIS — M1611 Unilateral primary osteoarthritis, right hip: Secondary | ICD-10-CM

## 2022-08-24 DIAGNOSIS — M87051 Idiopathic aseptic necrosis of right femur: Secondary | ICD-10-CM | POA: Diagnosis not present

## 2022-08-24 MED ORDER — TRAMADOL HCL 50 MG PO TABS: 50.0000 mg | ORAL_TABLET | Freq: Four times a day (QID) | ORAL | 0 refills | Status: DC | PRN

## 2022-08-24 NOTE — Progress Notes (Signed)
HPI: [name] returns today to go over the MRI of his right hip.  He states he had no change in the pain that he is having in his right hip.  He does feel like the hip could give way.  He is asking if there is anything we can do for the pain that he is having in his hip.  He states the gabapentin really has not helped. MRI images of the right hip are reviewed with the patient.  Shows moderate right hip arthritis with a small area of avascular necrosis involving the right femoral head.  Review of systems: Negative for fevers chills.  Physical exam: General well-developed well-nourished male in no acute distress ambulates with an antalgic gait no assistive device. Psych: Oriented x 3. Left hip full range of motion without pain.  Right hip pain with internal/external rotation.  Right hip full range of motion.  Patient lying supine in the surgical region for anterior hip incision is acceptable.  Large abdominal pannus is quite mobile.  Impression: Right hip avascular necrosis/osteoarthritis Obesity with BMI 48.99  Plan discussed with him of using a cane in his left hand so that he does not fall.  Will see him back in 3 months for height and weight check.  He has TMJ and will need to be under a BMI of 40 before proceeding with surgery.  Advised him to have intra-articular injections given the AVN of the right hip.  Will send in some tramadol for pain relief.  Questions were encouraged and answered.

## 2022-09-07 ENCOUNTER — Ambulatory Visit: Payer: 59 | Admitting: Family Medicine

## 2022-09-07 DIAGNOSIS — E039 Hypothyroidism, unspecified: Secondary | ICD-10-CM | POA: Diagnosis not present

## 2022-09-07 DIAGNOSIS — E785 Hyperlipidemia, unspecified: Secondary | ICD-10-CM | POA: Diagnosis not present

## 2022-09-07 DIAGNOSIS — Z7984 Long term (current) use of oral hypoglycemic drugs: Secondary | ICD-10-CM | POA: Diagnosis not present

## 2022-09-07 DIAGNOSIS — Z833 Family history of diabetes mellitus: Secondary | ICD-10-CM | POA: Diagnosis not present

## 2022-09-07 DIAGNOSIS — Z8249 Family history of ischemic heart disease and other diseases of the circulatory system: Secondary | ICD-10-CM | POA: Diagnosis not present

## 2022-09-07 DIAGNOSIS — Z882 Allergy status to sulfonamides status: Secondary | ICD-10-CM | POA: Diagnosis not present

## 2022-09-07 DIAGNOSIS — Z87891 Personal history of nicotine dependence: Secondary | ICD-10-CM | POA: Diagnosis not present

## 2022-09-07 DIAGNOSIS — Z809 Family history of malignant neoplasm, unspecified: Secondary | ICD-10-CM | POA: Diagnosis not present

## 2022-09-07 DIAGNOSIS — N529 Male erectile dysfunction, unspecified: Secondary | ICD-10-CM | POA: Diagnosis not present

## 2022-09-07 DIAGNOSIS — I10 Essential (primary) hypertension: Secondary | ICD-10-CM | POA: Diagnosis not present

## 2022-09-07 DIAGNOSIS — G8929 Other chronic pain: Secondary | ICD-10-CM | POA: Diagnosis not present

## 2022-09-07 DIAGNOSIS — E119 Type 2 diabetes mellitus without complications: Secondary | ICD-10-CM | POA: Diagnosis not present

## 2022-09-08 ENCOUNTER — Ambulatory Visit: Payer: 59 | Admitting: Family Medicine

## 2022-09-08 ENCOUNTER — Encounter: Payer: Self-pay | Admitting: Family Medicine

## 2022-09-08 VITALS — BP 114/77 | HR 89 | Temp 98.5°F | Ht 70.0 in | Wt 336.1 lb

## 2022-09-08 DIAGNOSIS — I152 Hypertension secondary to endocrine disorders: Secondary | ICD-10-CM | POA: Diagnosis not present

## 2022-09-08 DIAGNOSIS — G8929 Other chronic pain: Secondary | ICD-10-CM

## 2022-09-08 DIAGNOSIS — E039 Hypothyroidism, unspecified: Secondary | ICD-10-CM | POA: Diagnosis not present

## 2022-09-08 DIAGNOSIS — E1165 Type 2 diabetes mellitus with hyperglycemia: Secondary | ICD-10-CM | POA: Diagnosis not present

## 2022-09-08 DIAGNOSIS — Z1211 Encounter for screening for malignant neoplasm of colon: Secondary | ICD-10-CM | POA: Diagnosis not present

## 2022-09-08 DIAGNOSIS — I700 Atherosclerosis of aorta: Secondary | ICD-10-CM | POA: Diagnosis not present

## 2022-09-08 DIAGNOSIS — E1169 Type 2 diabetes mellitus with other specified complication: Secondary | ICD-10-CM

## 2022-09-08 DIAGNOSIS — E1159 Type 2 diabetes mellitus with other circulatory complications: Secondary | ICD-10-CM

## 2022-09-08 DIAGNOSIS — K219 Gastro-esophageal reflux disease without esophagitis: Secondary | ICD-10-CM | POA: Diagnosis not present

## 2022-09-08 DIAGNOSIS — E785 Hyperlipidemia, unspecified: Secondary | ICD-10-CM | POA: Diagnosis not present

## 2022-09-08 DIAGNOSIS — I4729 Other ventricular tachycardia: Secondary | ICD-10-CM | POA: Insufficient documentation

## 2022-09-08 DIAGNOSIS — M25551 Pain in right hip: Secondary | ICD-10-CM | POA: Diagnosis not present

## 2022-09-08 DIAGNOSIS — E1122 Type 2 diabetes mellitus with diabetic chronic kidney disease: Secondary | ICD-10-CM | POA: Insufficient documentation

## 2022-09-08 DIAGNOSIS — Z6841 Body Mass Index (BMI) 40.0 and over, adult: Secondary | ICD-10-CM

## 2022-09-08 LAB — BAYER DCA HB A1C WAIVED: HB A1C (BAYER DCA - WAIVED): 7.1 % — ABNORMAL HIGH (ref 4.8–5.6)

## 2022-09-08 MED ORDER — METFORMIN HCL ER (MOD) 1000 MG PO TB24: 1000.0000 mg | ORAL_TABLET | Freq: Two times a day (BID) | ORAL | 3 refills | Status: DC

## 2022-09-08 NOTE — Progress Notes (Addendum)
Established Patient Office Visit  Subjective   Patient ID: Daniel Scott, male    DOB: 1961-07-30  Age: 62 y.o. MRN: [id number]  Chief Complaint  Patient presents with   Medical Management of Chronic Issues   Diabetes    Diabetes   T2DM Pt presents for evaluation of Type 2 diabetes mellitus. Patient denies foot ulcerations, increased appetite, nausea, polydipsia, polyuria, visual disturbances, vomiting, and weight loss.  Current diabetic medications include metformin, trulicity, jardiance Compliant with meds - Yes  Current monitoring regimen:  rarely checks  Eye exam current (within one year): yes, will request records Current diet: regular, working on portion control Current exercise:  limited due to chronic hip pain  Urine microalbumin UTD? Yes Is He on ACE inhibitor or angiotensin II receptor blocker?  Yes, lisinopril Is He on statin? Yes atorvastatin Is He on ASA 81 mg daily?  Yes  2. HTN Complaint with meds - Yes Current Medications - amlodipine 10 mg, lisinopril 20 mg, spironlactone 25 mg, metoprolol Checking BP at home - rarely Pertinent ROS:  Headache - No Fatigue - No Visual Disturbances - No Chest pain - No Dyspnea - No Palpitations - No LE edema - No  3. HLD On atorvastatin 80 mg.   4. GERD On omeprazole. Reports well controlled.   5. Hypothyroid On levothyroxine 75 mcg. Recent TSH was therapeutic. Reports compliance. No symptoms.   6. Chronic right hip pain He has been dealing with chronic right hip pain. He is established with ortho. He needs a hip replaced but has to lose weight first.   Past Medical History:  Diagnosis Date   Arthritis    hands and feet   Depression    Diabetes mellitus without complication (HCC)    type 2   Fatty liver    GERD (gastroesophageal reflux disease)    Hiatal hernia    Hyperlipidemia    Hypertension    Hypothyroidism    Neuromuscular disorder (HCC)    stenosis   Pneumonia 1990   Stenosis of  cervical spine    Thyroid disease       ROS As per HPI.    Objective:     BP 114/77   Pulse 89   Temp 98.5 F (36.9 C) (Temporal)   Ht 5\' 10"  (1.778 m)   Wt (!) 336 lb 2 oz (152.5 kg)   SpO2 94%   BMI 48.23 kg/m  BP Readings from Last 3 Encounters:  09/08/22 114/77  06/21/22 118/70  06/06/22 116/76      Physical Exam Vitals and nursing note reviewed.  Constitutional:      General: He is not in acute distress.    Appearance: He is obese. He is not ill-appearing, toxic-appearing or diaphoretic.  HENT:     Head: Normocephalic and atraumatic.     Nose: Nose normal.     Mouth/Throat:     Mouth: Mucous membranes are moist.     Pharynx: Oropharynx is clear.  Cardiovascular:     Rate and Rhythm: Normal rate and regular rhythm.     Heart sounds: Normal heart sounds. No murmur heard. Pulmonary:     Effort: Pulmonary effort is normal. No respiratory distress.     Breath sounds: Normal breath sounds.  Abdominal:     General: Bowel sounds are normal.     Palpations: Abdomen is soft.  Musculoskeletal:     Right lower leg: No edema.     Left lower leg: No edema.  Skin:    General: Skin is warm and dry.  Neurological:     General: No focal deficit present.     Mental Status: He is alert and oriented to person, place, and time.  Psychiatric:        Mood and Affect: Mood normal.        Behavior: Behavior normal.        Thought Content: Thought content normal.        Judgment: Judgment normal.      No results found for any visits on 09/08/22.    The 10-year ASCVD risk score (Arnett DK, et al., 2019) is: 16.5%    Assessment & Plan:   Daniel Scott was seen today for medical management of chronic issues and diabetes.  Diagnoses and all orders for this visit:  Type 2 diabetes mellitus with hyperglycemia, without long-term current use of insulin (HCC) A1c 7.1 today, not at goal of <7. Increase metformin to 1000 mg BID. Continue trulicity, jardiance. On ACE, statin,  aspirin. Will request eye exam records. Foot exam and urine micro UTD. Will route chart to [facility] regarding assistance for [employer].  -     Bayer DCA Hb A1c Waived -     metFORMIN (GLUMETZA) 1000 MG (MOD) 24 hr tablet; Take 1 tablet (1,000 mg total) by mouth 2 (two) times daily with a meal.  Hypertension associated with type 2 diabetes mellitus ([place]) Well controlled on current regimen. On lisinopril, amlodipine, metoprolol, spironolactone.   Morbid obesity ([place]) Referral to weight manage. Needs weight loss for hip replacement.  -     Amb Ref to Medical Weight Management  Hyperlipidemia associated with type 2 diabetes mellitus ([place]) Well controlled on statin.   Aortic atherosclerosis (HCC) On statin and aspirin.   NSVT (nonsustained ventricular tachycardia) ([place]) Managed by cardiology. Well controlled with metoprolol.   Acquired hypothyroidism TSH at goal with levothyroxine. Will recheck TSH at next visit.   Gastroesophageal reflux disease, unspecified whether esophagitis present Well controlled on current regimen. Continue omeprazole.   Chronic right hip pain Managed by ortho.   Colon cancer screening -     Ambulatory referral to Gastroenterology  Return in about 3 months (around 12/08/2022) for chronic follow up.   The patient indicates understanding of these issues and agrees with the plan.  Gwenlyn Perking, FNP

## 2022-09-10 ENCOUNTER — Other Ambulatory Visit: Payer: Self-pay | Admitting: Family Medicine

## 2022-09-10 DIAGNOSIS — E1165 Type 2 diabetes mellitus with hyperglycemia: Secondary | ICD-10-CM

## 2022-09-11 ENCOUNTER — Encounter: Payer: Self-pay | Admitting: Family Medicine

## 2022-09-11 ENCOUNTER — Other Ambulatory Visit: Payer: Self-pay | Admitting: Family Medicine

## 2022-09-11 ENCOUNTER — Other Ambulatory Visit: Payer: Self-pay | Admitting: Physician Assistant

## 2022-09-11 DIAGNOSIS — E1165 Type 2 diabetes mellitus with hyperglycemia: Secondary | ICD-10-CM

## 2022-09-12 NOTE — Telephone Encounter (Signed)
  metFORMIN (GLUCOPHAGE) 1000 MG tablet    Pharmacy comment: Alternative Requested:GLUMETZA GENERIC NOT COVERED. METFORMIN ER 500MG  (GLUCOPHAGE XR), OR IR METFORMIN LISTED AS COVERED ALTERNATIVE

## 2022-09-16 ENCOUNTER — Other Ambulatory Visit: Payer: Self-pay | Admitting: Family Medicine

## 2022-09-16 ENCOUNTER — Encounter: Payer: Self-pay | Admitting: *Deleted

## 2022-09-19 ENCOUNTER — Telehealth: Payer: Self-pay

## 2022-09-19 NOTE — Telephone Encounter (Signed)
Daniel Scott (Key: [id number]) PA Case ID #: [id number] Rx #: [id number] Need Help? Call us at [phone] Outcome Approved on August 26, 2022 Your PA request has been approved. Additional information will be provided in the approval communication. (Message 1145) Authorization Expiration Date: 02/22/2023

## 2022-09-28 MED ORDER — TRULICITY 3 MG/0.5ML ~~LOC~~ SOAJ: 3.0000 mg | SUBCUTANEOUS | 3 refills | Status: DC

## 2022-10-02 ENCOUNTER — Telehealth: Payer: Self-pay | Admitting: Family Medicine

## 2022-10-02 DIAGNOSIS — K219 Gastro-esophageal reflux disease without esophagitis: Secondary | ICD-10-CM

## 2022-10-03 NOTE — Telephone Encounter (Signed)
Name from pharmacy: [facility] [address] [place] comment: Alternative Requested:NEEDS PA FOR FURTHER FILLS

## 2022-10-04 ENCOUNTER — Encounter: Payer: Self-pay | Admitting: *Deleted

## 2022-10-04 NOTE — Patient Instructions (Signed)
Procedure: colonoscopy  Estimated body mass index is 48.07 kg/m as calculated from the following:   Height as of this encounter: 5\' 10"  (1.778 m).   Weight as of this encounter: 335 lb (152 kg).   Have you had a colonoscopy before?  Yes, 2013 & 2018  Do you have family history of colon cancer?  Yes, father  Do you have a family history of polyps? yes  Previous colonoscopy with polyps removed? Yes, 2013  Do you have a history colorectal cancer?   No  Are you diabetic?  Yes, Type 2  Do you have a prosthetic or mechanical heart valve? No  Do you have a pacemaker/defibrillator?   no  Have you had endocarditis/atrial fibrillation?  no  Do you use supplemental oxygen/CPAP?  no  Have you had joint replacement within the last 12 months?  no  Do you tend to be constipated or have to use laxatives?  no   Do you have history of alcohol use? If yes, how much and how often.  no  Do you have history or are you using drugs? If yes, what do are you  using?  Yes, 2-3 beers daily, occasional whiskey  Have you ever had a stroke/heart attack?  no  Have you ever had a heart or other vascular stent placed,?no  Do you take weight loss medication? no  Do you take any blood-thinning medications such as: (Plavix, aspirin, Coumadin, Aggrenox, Brilinta, Xarelto, Eliquis, Pradaxa, Savaysa or Effient)? yes  If yes we need the name, milligram, dosage and who is prescribing doctor:  aspirin 325 mg             Current Outpatient Medications  Medication Sig Dispense Refill   amLODipine (NORVASC) 10 MG tablet Take 1 tablet (10 mg total) by mouth daily. 90 tablet 1   APPLE CIDER VINEGAR PO Take 450 mg by mouth 2 (two) times daily.     aspirin 325 MG EC tablet Take 325 mg by mouth daily.     atorvastatin (LIPITOR) 80 MG tablet Take 1 tablet (80 mg total) by mouth daily. 90 tablet 1   Berberine Chloride (BERBERINE HCI PO) Take 400 mg by mouth 2 (two) times daily.     Cholecalciferol (DIALYVITE  VITAMIN D 5000) 125 MCG (5000 UT) capsule Take 5,000 Units by mouth daily.     Dulaglutide (TRULICITY) 3 WL/7.9GX SOPN Inject 3 mg as directed once a week. 2 mL 3   empagliflozin (JARDIANCE) 25 MG TABS tablet TAKE 1 TABLET BY MOUTH EVERY DAY BEFORE BREAKFAST 30 tablet 2   gabapentin (NEURONTIN) 600 MG tablet Take 1 tablet (600 mg total) by mouth 3 (three) times daily. 211 tablet 1   Garlic 9417 MG CAPS Take 1,000 mg by mouth 2 (two) times daily.     Ibuprofen 200 MG CAPS      Krill Oil 500 MG CAPS Take 500 mg by mouth daily.     levothyroxine (SYNTHROID) 75 MCG tablet TAKE 1 TABLET BY MOUTH EVERY DAY BEFORE BREAKFAST 90 tablet 3   lisinopril (ZESTRIL) 20 MG tablet Take 1 tablet (20 mg total) by mouth daily. 90 tablet 1   metFORMIN (GLUCOPHAGE-XR) 500 MG 24 hr tablet Take 2 tablets (1,000 mg total) by mouth 2 (two) times daily with a meal. 180 tablet 3   metoprolol succinate (TOPROL-XL) 25 MG 24 hr tablet Take 0.5 tablets (12.5 mg total) by mouth daily. Take with or immediately following a meal. 45 tablet 3  Multiple Vitamin (MULTIVITAMIN ADULT PO) Take 1 tablet by mouth daily.      olopatadine (PATANOL) 0.1 % ophthalmic solution Place 1 drop into both eyes daily as needed for allergies.     omeprazole (PRILOSEC) 20 MG capsule Take 1 capsule (20 mg total) by mouth daily. 90 capsule 1   psyllium (HYDROCIL/METAMUCIL) 95 % PACK Take 1 packet by mouth daily.     sildenafil (VIAGRA) 100 MG tablet Take 1 tablet (100 mg total) by mouth daily as needed for erectile dysfunction. 30 tablet 2   spironolactone (ALDACTONE) 25 MG tablet Take 1 tablet (25 mg total) by mouth daily. 90 tablet 1   traMADol (ULTRAM) 50 MG tablet TAKE 1 TABLET BY MOUTH EVERY 6 HOURS AS NEEDED. 30 tablet 0   CINNAMON PO Take 1,000 mg by mouth 2 (two) times daily.     No current facility-administered medications for this visit.    Allergies  Allergen Reactions   Sulfa Antibiotics

## 2022-10-05 ENCOUNTER — Other Ambulatory Visit (HOSPITAL_COMMUNITY): Payer: Self-pay

## 2022-10-11 ENCOUNTER — Encounter: Payer: Self-pay | Admitting: Gastroenterology

## 2022-10-11 NOTE — Progress Notes (Signed)
ASA 3 due to obesity and arrhythmia. Recommend OV prior to procedure.

## 2022-10-18 ENCOUNTER — Telehealth: Payer: Self-pay | Admitting: *Deleted

## 2022-10-18 NOTE — Telephone Encounter (Signed)
Daniel Scott (Key: [id number]) Ozempic (1 MG/DOSE) 4MG/3ML pen-injectors  Sent to plan

## 2022-10-19 ENCOUNTER — Other Ambulatory Visit (HOSPITAL_COMMUNITY): Payer: Self-pay

## 2022-10-19 NOTE — Telephone Encounter (Signed)
Pharmacy Patient Advocate Encounter  Received notification from [facility] that the request for prior authorization for Ozempic (1 MG/DOSE) 4MG/3ML pen-injectors has been denied due to not trying and failing the required number of formulary alternatives.       Please be advised we currently do not have a Pharmacist to review denials, therefore you will need to process appeals accordingly as needed. Thanks for your support at this time.   You may call [phone] or fax  [phone], to appeal.

## 2022-10-20 NOTE — Telephone Encounter (Signed)
Patient aware and verbalized understanding. °

## 2022-10-20 NOTE — Telephone Encounter (Signed)
Lmtcb.

## 2022-10-22 ENCOUNTER — Other Ambulatory Visit: Payer: Self-pay | Admitting: Family Medicine

## 2022-10-22 DIAGNOSIS — I10 Essential (primary) hypertension: Secondary | ICD-10-CM

## 2022-11-03 ENCOUNTER — Encounter: Payer: Self-pay | Admitting: Family Medicine

## 2022-11-08 NOTE — H&P (View-Only) (Signed)
  GI Office Note    Referring Provider: Morgan, Tiffany M, FNP Primary Care Physician:  Morgan, Tiffany M, FNP  Primary Gastroenterologist: Charles K. Carver, DO  Chief Complaint   Chief Complaint  Patient presents with   Colonoscopy    Colonoscopy screening   History of Present Illness   Daniel Scott is a 62 y.o. male presenting today at the request of Morgan, Tiffany M, FNP for evaluation prior to scheduling colonoscopy.  Per review of records patient had a colonoscopy in 2011 with a 3mm sessile serrated adenoma. Had EGD in 2011 which revealed hiatal hernia and gastritis. Underwent bariatric/reflux surgery and lap band placement with repair of hernia in June 2013. Notes strong family history of colon cancer.   Last colonoscopy in November 2018 in [place]. Dr. Brandon Mary -pan colonic diverticulosis -normal otherwise -advised repeat in 5 years for surveillance  Patient had left heart cath in August 2023 revealing chronic diastolic heart failure, EF 55%, patent LAD, left main, circumflex, RCA. Right heart cath not performed due to access issues.   Today:  Reports he used to live in [place] and they have a similar system to My[facility] of his prior colonoscopies. First colonoscopy in November 2013 Dr. Neil Cassman - [facility] in [place].   Father had colon cancer at a young age - does not have much details. Mother with possible bone marrow cancer, myelo fibrosis.   Had lap band in 2013 - has lost 50 lbs or so since procedure. Reports he needs a right hip replacement. Has had 3 heart caths - 1996, 2006, August 2023. No stents.   Taking tumeric. On Trulicity for about 2 years - was off on it briefly and was on Ozempic.   No constipation, diarrhea. Has occasional abdominal cramping he feels as though this is related to fiber and bloating. No N/V, good appetite, no unintentional weight loss, melena, brbrp.   GERD: has rare breakthrough symptoms. Minor  episodes. Had hiatal hernia repair with his lap band procedure. Otc dose of omeprazole. No dysphagia.    Current Outpatient Medications  Medication Sig Dispense Refill   amLODipine (NORVASC) 10 MG tablet Take 1 tablet (10 mg total) by mouth daily. 90 tablet 1   APPLE CIDER VINEGAR PO Take 450 mg by mouth 2 (two) times daily.     aspirin 325 MG EC tablet Take 325 mg by mouth daily.     atorvastatin (LIPITOR) 80 MG tablet Take 1 tablet (80 mg total) by mouth daily. 90 tablet 1   Cholecalciferol (DIALYVITE VITAMIN D 5000) 125 MCG (5000 UT) capsule Take 5,000 Units by mouth daily.     CINNAMON PO Take 1,000 mg by mouth 2 (two) times daily.     empagliflozin (JARDIANCE) 25 MG TABS tablet TAKE 1 TABLET BY MOUTH EVERY DAY BEFORE BREAKFAST 30 tablet 2   gabapentin (NEURONTIN) 600 MG tablet Take 1 tablet (600 mg total) by mouth 3 (three) times daily. 270 tablet 1   Garlic 1000 MG CAPS Take 1,000 mg by mouth 2 (two) times daily.     Ibuprofen 200 MG CAPS      Krill Oil 500 MG CAPS Take 500 mg by mouth daily.     levothyroxine (SYNTHROID) 75 MCG tablet TAKE 1 TABLET BY MOUTH EVERY DAY BEFORE BREAKFAST 90 tablet 3   lisinopril (ZESTRIL) 20 MG tablet Take 1 tablet (20 mg total) by mouth daily. 90 tablet 1   metFORMIN (GLUCOPHAGE-XR) 500 MG 24 hr   tablet Take 2 tablets (1,000 mg total) by mouth 2 (two) times daily with a meal. 180 tablet 3   metoprolol succinate (TOPROL-XL) 25 MG 24 hr tablet Take 0.5 tablets (12.5 mg total) by mouth daily. Take with or immediately following a meal. 45 tablet 3   Multiple Vitamin (MULTIVITAMIN ADULT PO) Take 1 tablet by mouth daily.      olopatadine (PATANOL) 0.1 % ophthalmic solution Place 1 drop into both eyes daily as needed for allergies.     omeprazole (PRILOSEC) 20 MG capsule Take 1 capsule (20 mg total) by mouth daily. 90 capsule 1   psyllium (HYDROCIL/METAMUCIL) 95 % PACK Take 1 packet by mouth daily.     spironolactone (ALDACTONE) 25 MG tablet TAKE 1 TABLET (25 MG  TOTAL) BY MOUTH DAILY. 90 tablet 0   Turmeric (QC TUMERIC COMPLEX PO)      Dulaglutide (TRULICITY) 3 MG/0.5ML SOPN Inject 3 mg as directed once a week. 2 mL 3   No current facility-administered medications for this visit.    Past Medical History:  Diagnosis Date   Arthritis    hands and feet   Depression    Diabetes mellitus without complication (HCC)    type 2   Fatty liver    GERD (gastroesophageal reflux disease)    Hiatal hernia    Hyperlipidemia    Hypertension    Hypothyroidism    Neuromuscular disorder (HCC)    stenosis   Pneumonia 1990   Stenosis of cervical spine    Thyroid disease     Past Surgical History:  Procedure Laterality Date   ANTERIOR CERVICAL DECOMP/DISCECTOMY FUSION N/A 06/02/2020   Procedure: ANTERIOR CERVICAL DECOMPRESSION/DISCECTOMY FUSIONCERVICAL FOUR- CERVICAL FIVE, CERVICAL FIVE- CERVICAL SIX;  Surgeon: Dawley, Troy C, DO;  Location: [facility] OR;  Service: Neurosurgery;  Laterality: N/A;  ANTERIOR CERVICAL DECOMPRESSION/DISCECTOMY FUSIONCERVICAL FOUR- CERVICAL FIVE, CERVICAL FIVE- CERVICAL SIX   CARDIAC CATHETERIZATION  1996; 2006   [facility]   HYDROCELE EXCISION     LAPAROSCOPIC GASTRIC BANDING     LAPAROSCOPIC ROUX-EN-Y GASTRIC BYPASS WITH UPPER ENDOSCOPY AND REMOVAL OF LAP BAND  02/2012   LEFT HEART CATH AND CORONARY ANGIOGRAPHY N/A 04/21/2022   Procedure: LEFT HEART CATH AND CORONARY ANGIOGRAPHY;  Surgeon: Smith, Henry W, MD;  Location: [facility] INVASIVE CV LAB;  Service: Cardiovascular;  Laterality: N/A;   TONSILLECTOMY      Family History  Problem Relation Age of Onset   Vascular Disease Mother    Cancer Mother        Bone marrow   Kidney disease Mother    Thyroid disease Mother    Hypertension Mother    Diabetes Mother    Coronary artery disease Father    Colon cancer Father    Heart disease Father    Hyperlipidemia Father    Hypertension Father    Spina bifida Daughter    Depression Son    Anxiety disorder Son     Hypertension Son    Breast cancer Maternal Grandmother    Hypertension Maternal Grandmother    Stroke Maternal Grandmother    Lung cancer Maternal Grandfather    Diabetes Maternal Grandfather    Heart disease Maternal Grandfather    Hypertension Maternal Grandfather    Stroke Maternal Grandfather    Hypertension Paternal Grandmother    Heart disease Paternal Grandfather    Hypertension Paternal Grandfather    Autism Son    Bipolar disorder Son    Autism Son       Allergies as of 11/10/2022 - Review Complete 11/10/2022  Allergen Reaction Noted   Sulfa antibiotics  10/15/2019    Social History   Socioeconomic History   Marital status: Married    Spouse name: Not on file   Number of children: Not on file   Years of education: Not on file   Highest education level: Not on file  Occupational History   Not on file  Tobacco Use   Smoking status: Former    Types: Cigarettes    Quit date: 05/2009    Years since quitting: 13.5   Smokeless tobacco: Never  Vaping Use   Vaping Use: Never used  Substance and Sexual Activity   Alcohol use: Yes    Comment: Couple times a week   Drug use: Yes    Types: Marijuana    Comment: occ   Sexual activity: Yes    Birth control/protection: None  Other Topics Concern   Not on file  Social History Narrative   Not on file   Social Determinants of Health   Financial Resource Strain: Not on file  Food Insecurity: Not on file  Transportation Needs: Not on file  Physical Activity: Not on file  Stress: Not on file  Social Connections: Not on file  Intimate Partner Violence: Not on file     Review of Systems   Gen: Denies any fever, chills, fatigue, weight loss, lack of appetite.  CV: Denies chest pain, heart palpitations, peripheral edema, syncope.  Resp: Denies shortness of breath at rest or with exertion. Denies wheezing or cough.  GI: see HPI GU : Denies urinary burning, urinary frequency, urinary hesitancy MS: Denies joint pain,  muscle weakness, cramps, or limitation of movement.  Derm: Denies rash, itching, dry skin Psych: Denies depression, anxiety, memory loss, and confusion Heme: Denies bruising, bleeding, and enlarged lymph nodes.   Physical Exam   BP 130/78 (BP Location: Right Arm, Patient Position: Sitting, Cuff Size: Large)   Pulse 76   Temp (!) 97.5 F (36.4 C) (Temporal)   Ht 5' 10" (1.778 m)   Wt (!) 338 lb 3.2 oz (153.4 kg)   SpO2 98%   BMI 48.53 kg/m   General:   Alert and oriented. Pleasant and cooperative. Well-nourished and well-developed.  Head:  Normocephalic and atraumatic. Eyes:  Without icterus, sclera clear and conjunctiva pink.  Ears:  Normal auditory acuity. Mouth:  No deformity or lesions, oral mucosa pink.  Lungs:  Clear to auscultation bilaterally. No wheezes, rales, or rhonchi. No distress.  Heart:  S1, S2 present without murmurs appreciated.  Abdomen:  +BS, soft, non-tender and non-distended, rounded. No HSM noted. No guarding or rebound. No masses appreciated.  Rectal:  Deferred  Msk:  Symmetrical without gross deformities. Normal posture. Extremities:  Without edema. Neurologic:  Alert and  oriented x4;  grossly normal neurologically. Skin:  Intact without significant lesions or rashes. Psych:  Alert and cooperative. Normal mood and affect.   Assessment   Daniel Scott is a 61 y.o. male with a history of lap band in 2013 with hiatal hernia repair, chronic diastolic heart failure, GERD, hypothyroidism, fatty liver, diabetes, HTN, HLD, depression, cervical spinal stenosis presenting today for evaluation prior to colonoscopy.  History of colon polyps, family history of colon cancer: Family history of colon cancer in his father diagnosed at young age.  Patient has personal history of colon polyps as well dating back to 2011.  Last colonoscopy in 2018 without polyps.  5-year repeat was recommended.    He is due for surveillance.  GERD: Well-controlled with omeprazole 20 mg once  daily.  Also has had improvement of GERD since his Lap-Band with hiatal hernia repair in 2013.  PLAN   Proceed with colonoscopy with propofol by Dr. Carver in near future: the risks, benefits, and alternatives have been discussed with the patient in detail. The patient states understanding and desires to proceed. ASA 3 Hold metformin night prior to and mornings of procedures Hold Jardiance for 3 days prior Hold Trulicity for 1 week Clenpiq or Suprep for bowel prep.  Patient will pay out-of-pocket if needed. Continue omeprazole 20 mg daily as needed.    Rainen Vanrossum, MSN, FNP-BC, AGACNP-BC [facility] 

## 2022-11-08 NOTE — Progress Notes (Unsigned)
GI Office Note    Referring Provider: Gwenlyn Perking, FNP Primary Care Physician:  Gwenlyn Perking, FNP  Primary Gastroenterologist: Elon Alas. Abbey Chatters, DO  Chief Complaint   Chief Complaint  Patient presents with   Colonoscopy    Colonoscopy screening   History of Present Illness   Daniel Scott is a 62 y.o. male presenting today at the request of Gwenlyn Perking, FNP for evaluation prior to scheduling colonoscopy.  Per review of records patient had a colonoscopy in 2011 with a 75m sessile serrated adenoma. Had EGD in 2011 which revealed hiatal hernia and gastritis. Underwent bariatric/reflux surgery and lap band placement with repair of hernia in June 2013. Notes strong family history of colon cancer.   [place] [facility] BAllegra Grana-pan colonic diverticulosis -normal otherwise -advised repeat in 5 years for surveillance  Patient had left heart cath in August 2023 revealing chronic diastolic heart failure, EF 55%, patent LAD, left main, circumflex, RCA. Right heart cath not performed due to access issues.   Today:  [place]. [facility] [place]   Father had colon cancer at a young age - does not have much details. Mother with possible bone marrow cancer, myelo fibrosis.   Had lap band in 2013 - has lost 50 lbs or so since procedure. Reports he needs a right hip replacement. Has had 3 heart caths - 1996, 2006, August 2023. No stents.   Taking tumeric. On Trulicity for about 2 years - was off on it briefly and was on Ozempic.   No constipation, diarrhea. Has occasional abdominal cramping he feels as though this is related to fiber and bloating. No N/V, good appetite, no unintentional weight loss, melena, brbrp.   GERD: has rare breakthrough symptoms. Minor  episodes. Had hiatal hernia repair with his lap band procedure. Otc dose of omeprazole. No dysphagia.    Current Outpatient Medications  Medication Sig Dispense Refill   amLODipine (NORVASC) 10 MG tablet Take 1 tablet (10 mg total) by mouth daily. 90 tablet 1   APPLE CIDER VINEGAR PO Take 450 mg by mouth 2 (two) times daily.     aspirin 325 MG EC tablet Take 325 mg by mouth daily.     atorvastatin (LIPITOR) 80 MG tablet Take 1 tablet (80 mg total) by mouth daily. 90 tablet 1   Cholecalciferol (DIALYVITE VITAMIN D 5000) 125 MCG (5000 UT) capsule Take 5,000 Units by mouth daily.     CINNAMON PO Take 1,000 mg by mouth 2 (two) times daily.     empagliflozin (JARDIANCE) 25 MG TABS tablet TAKE 1 TABLET BY MOUTH EVERY DAY BEFORE BREAKFAST 30 tablet 2   gabapentin (NEURONTIN) 600 MG tablet Take 1 tablet (600 mg total) by mouth 3 (three) times daily. [id number] 1   Garlic 1123XX123MG CAPS Take 1,000 mg by mouth 2 (two) times daily.     Ibuprofen 200 MG CAPS      Krill Oil 500 MG CAPS Take 500 mg by mouth daily.     levothyroxine (SYNTHROID) 75 MCG tablet TAKE 1 TABLET BY MOUTH EVERY DAY BEFORE BREAKFAST 90 tablet 3   lisinopril (ZESTRIL) 20 MG tablet Take 1 tablet (20 mg total) by mouth daily. 90 tablet 1   metFORMIN (GLUCOPHAGE-XR) 500 MG 24 hr  tablet Take 2 tablets (1,000 mg total) by mouth 2 (two) times daily with a meal. 180 tablet 3   metoprolol succinate (TOPROL-XL) 25 MG 24 hr tablet Take 0.5 tablets (12.5 mg total) by mouth daily. Take with or immediately following a meal. 45 tablet 3   Multiple Vitamin (MULTIVITAMIN ADULT PO) Take 1 tablet by mouth daily.      olopatadine (PATANOL) 0.1 % ophthalmic solution Place 1 drop into both eyes daily as needed for allergies.     omeprazole (PRILOSEC) 20 MG capsule Take 1 capsule (20 mg total) by mouth daily. 90 capsule 1   psyllium (HYDROCIL/METAMUCIL) 95 % PACK Take 1 packet by mouth daily.     spironolactone (ALDACTONE) 25 MG tablet TAKE 1 TABLET (25 MG  TOTAL) BY MOUTH DAILY. 90 tablet 0   Turmeric (QC TUMERIC COMPLEX PO)      Dulaglutide (TRULICITY) 3 0000000 SOPN Inject 3 mg as directed once a week. 2 mL 3   No current facility-administered medications for this visit.    Past Medical History:  Diagnosis Date   Arthritis    hands and feet   Depression    Diabetes mellitus without complication ([place])    type 2   Fatty liver    GERD (gastroesophageal reflux disease)    Hiatal hernia    Hyperlipidemia    Hypertension    Hypothyroidism    Neuromuscular disorder ([place])    stenosis   Pneumonia 1990   Stenosis of cervical spine    Thyroid disease     Past Surgical History:  Procedure Laterality Date   ANTERIOR CERVICAL DECOMP/DISCECTOMY FUSION N/A 06/02/2020   Procedure: ANTERIOR CERVICAL DECOMPRESSION/DISCECTOMY FUSIONCERVICAL FOUR- CERVICAL FIVE, CERVICAL FIVE- CERVICAL SIX;  Surgeon: Dawley, Theodoro Doing, DO;  Location: [facility];  Service: Neurosurgery;  Laterality: N/A;  ANTERIOR CERVICAL DECOMPRESSION/DISCECTOMY FUSIONCERVICAL FOUR- CERVICAL FIVE, CERVICAL FIVE- CERVICAL SIX   CARDIAC CATHETERIZATION  1996; 2006   [facility]   HYDROCELE EXCISION     LAPAROSCOPIC GASTRIC BANDING     LAPAROSCOPIC ROUX-EN-Y GASTRIC BYPASS WITH UPPER ENDOSCOPY AND REMOVAL OF LAP BAND  02/2012   LEFT HEART CATH AND CORONARY ANGIOGRAPHY N/A 04/21/2022   Procedure: LEFT HEART CATH AND CORONARY ANGIOGRAPHY;  Surgeon: Belva Crome, MD;  Location: [facility];  Service: Cardiovascular;  Laterality: N/A;   TONSILLECTOMY      Family History  Problem Relation Age of Onset   Vascular Disease Mother    Cancer Mother        Bone marrow   Kidney disease Mother    Thyroid disease Mother    Hypertension Mother    Diabetes Mother    Coronary artery disease Father    Colon cancer Father    Heart disease Father    Hyperlipidemia Father    Hypertension Father    Spina bifida Daughter    Depression Son    Anxiety disorder Son     Hypertension Son    Breast cancer Maternal Grandmother    Hypertension Maternal Grandmother    Stroke Maternal Grandmother    Lung cancer Maternal Grandfather    Diabetes Maternal Grandfather    Heart disease Maternal Grandfather    Hypertension Maternal Grandfather    Stroke Maternal Grandfather    Hypertension Paternal Grandmother    Heart disease Paternal Grandfather    Hypertension Paternal Grandfather    Autism Son    Bipolar disorder Son    Autism Son  Allergies as of 11/10/2022 - Review Complete 11/10/2022  Allergen Reaction Noted   Sulfa antibiotics  10/15/2019    Social History   Socioeconomic History   Marital status: Married    Spouse name: Not on file   Number of children: Not on file   Years of education: Not on file   Highest education level: Not on file  Occupational History   Not on file  Tobacco Use   Smoking status: Former    Types: Cigarettes    Quit date: 05/2009    Years since quitting: 13.5   Smokeless tobacco: Never  Vaping Use   Vaping Use: Never used  Substance and Sexual Activity   Alcohol use: Yes    Comment: Couple times a week   Drug use: Yes    Types: Marijuana    Comment: occ   Sexual activity: Yes    Birth control/protection: None  Other Topics Concern   Not on file  Social History Narrative   Not on file   Social Determinants of Health   Financial Resource Strain: Not on file  Food Insecurity: Not on file  Transportation Needs: Not on file  Physical Activity: Not on file  Stress: Not on file  Social Connections: Not on file  Intimate Partner Violence: Not on file     Review of Systems   Gen: Denies any fever, chills, fatigue, weight loss, lack of appetite.  CV: Denies chest pain, heart palpitations, peripheral edema, syncope.  Resp: Denies shortness of breath at rest or with exertion. Denies wheezing or cough.  GI: see HPI GU : Denies urinary burning, urinary frequency, urinary hesitancy MS: Denies joint pain,  muscle weakness, cramps, or limitation of movement.  Derm: Denies rash, itching, dry skin Psych: Denies depression, anxiety, memory loss, and confusion Heme: Denies bruising, bleeding, and enlarged lymph nodes.   Physical Exam   BP 130/78 (BP Location: Right Arm, Patient Position: Sitting, Cuff Size: Large)   Pulse 76   Temp (!) 97.5 F (36.4 C) (Temporal)   Ht '5\' 10"'$  (1.778 m)   Wt (!) 338 lb 3.2 oz (153.4 kg)   SpO2 98%   BMI 48.53 kg/m   General:   Alert and oriented. Pleasant and cooperative. Well-nourished and well-developed.  Head:  Normocephalic and atraumatic. Eyes:  Without icterus, sclera clear and conjunctiva pink.  Ears:  Normal auditory acuity. Mouth:  No deformity or lesions, oral mucosa pink.  Lungs:  Clear to auscultation bilaterally. No wheezes, rales, or rhonchi. No distress.  Heart:  S1, S2 present without murmurs appreciated.  Abdomen:  +BS, soft, non-tender and non-distended, rounded. No HSM noted. No guarding or rebound. No masses appreciated.  Rectal:  Deferred  Msk:  Symmetrical without gross deformities. Normal posture. Extremities:  Without edema. Neurologic:  Alert and  oriented x4;  grossly normal neurologically. Skin:  Intact without significant lesions or rashes. Psych:  Alert and cooperative. Normal mood and affect.   Assessment   Daniel Scott is a 62 y.o. male with a history of lap band in 2013 with hiatal hernia repair, chronic diastolic heart failure, GERD, hypothyroidism, fatty liver, diabetes, HTN, HLD, depression, cervical spinal stenosis presenting today for evaluation prior to colonoscopy.  History of colon polyps, family history of colon cancer: Family history of colon cancer in his father diagnosed at young age.  Patient has personal history of colon polyps as well dating back to 2011.  Last colonoscopy in 2018 without polyps.  5-year repeat was recommended.  He is due for surveillance.  GERD: Well-controlled with omeprazole 20 mg once  daily.  Also has had improvement of GERD since his Lap-Band with hiatal hernia repair in 2013.  PLAN   Proceed with colonoscopy with propofol by Dr. Abbey Chatters in near future: the risks, benefits, and alternatives have been discussed with the patient in detail. The patient states understanding and desires to proceed. ASA 3 Hold metformin night prior to and mornings of procedures Hold Jardiance for 3 days prior Hold Trulicity for 1 week Clenpiq or Suprep for bowel prep.  Patient will pay out-of-pocket if needed. Continue omeprazole 20 mg daily as needed.    Venetia Night, MSN, FNP-BC, AGACNP-BC [facility]

## 2022-11-10 ENCOUNTER — Encounter: Payer: Self-pay | Admitting: *Deleted

## 2022-11-10 ENCOUNTER — Encounter: Payer: Self-pay | Admitting: Gastroenterology

## 2022-11-10 ENCOUNTER — Other Ambulatory Visit: Payer: Self-pay | Admitting: *Deleted

## 2022-11-10 ENCOUNTER — Encounter: Payer: Self-pay | Admitting: Radiology

## 2022-11-10 ENCOUNTER — Ambulatory Visit: Payer: 59 | Admitting: Gastroenterology

## 2022-11-10 VITALS — BP 130/78 | HR 76 | Temp 97.5°F | Ht 70.0 in | Wt 338.2 lb

## 2022-11-10 DIAGNOSIS — Z09 Encounter for follow-up examination after completed treatment for conditions other than malignant neoplasm: Secondary | ICD-10-CM

## 2022-11-10 DIAGNOSIS — Z86010 Personal history of colonic polyps: Secondary | ICD-10-CM

## 2022-11-10 DIAGNOSIS — Z800 Family history of malignant neoplasm of digestive organs: Secondary | ICD-10-CM

## 2022-11-10 DIAGNOSIS — K219 Gastro-esophageal reflux disease without esophagitis: Secondary | ICD-10-CM

## 2022-11-10 MED ORDER — NA SULFATE-K SULFATE-MG SULF 17.5-3.13-1.6 GM/177ML PO SOLN: ORAL | 0 refills | Status: DC

## 2022-11-10 NOTE — Patient Instructions (Addendum)
We will get scheduled for a colonoscopy in the near future with Dr. Abbey Chatters.  You will receive separate detailed written instructions regarding your prep.  In summary you will need to hold your metformin the night prior to and the morning of your procedure.  You will need to hold your Jardiance for 3 days prior.  You also need to hold your Trulicity for 1 week, having a procedure on day 8 or after.  Continue omeprazole 20 mg once daily for your reflux.  It was a pleasure to see you today. I want to create trusting relationships with patients. If you receive a survey regarding your visit,  I greatly appreciate you taking time to fill this out on paper or through your MyChart. I value your feedback.  Venetia Night, MSN, FNP-BC, AGACNP-BC [facility]

## 2022-11-17 ENCOUNTER — Encounter (INDEPENDENT_AMBULATORY_CARE_PROVIDER_SITE_OTHER): Payer: Self-pay | Admitting: Internal Medicine

## 2022-11-17 ENCOUNTER — Encounter: Payer: Self-pay | Admitting: Cardiology

## 2022-11-17 ENCOUNTER — Ambulatory Visit (INDEPENDENT_AMBULATORY_CARE_PROVIDER_SITE_OTHER): Payer: 59 | Admitting: Internal Medicine

## 2022-11-17 VITALS — BP 121/77 | HR 72 | Temp 98.1°F | Ht 70.0 in | Wt 333.0 lb

## 2022-11-17 DIAGNOSIS — Z7985 Long-term (current) use of injectable non-insulin antidiabetic drugs: Secondary | ICD-10-CM

## 2022-11-17 DIAGNOSIS — Z6841 Body Mass Index (BMI) 40.0 and over, adult: Secondary | ICD-10-CM | POA: Diagnosis not present

## 2022-11-17 DIAGNOSIS — Z7984 Long term (current) use of oral hypoglycemic drugs: Secondary | ICD-10-CM | POA: Diagnosis not present

## 2022-11-17 DIAGNOSIS — E1159 Type 2 diabetes mellitus with other circulatory complications: Secondary | ICD-10-CM

## 2022-11-17 DIAGNOSIS — R29818 Other symptoms and signs involving the nervous system: Secondary | ICD-10-CM | POA: Insufficient documentation

## 2022-11-17 DIAGNOSIS — E1165 Type 2 diabetes mellitus with hyperglycemia: Secondary | ICD-10-CM | POA: Diagnosis not present

## 2022-11-17 DIAGNOSIS — Z0289 Encounter for other administrative examinations: Secondary | ICD-10-CM

## 2022-11-17 DIAGNOSIS — I152 Hypertension secondary to endocrine disorders: Secondary | ICD-10-CM

## 2022-11-17 NOTE — Assessment & Plan Note (Signed)
Blood pressure at goal for age and risk category.  On amlodipine 10 mg, lisinopril 20 mg, metoprolol 25 mg (which may cause weight gain), spironolactone 25 mg without adverse effects.  Most recent renal parameters reviewed which showed normal electrolytes and kidney function.  Continue with weight loss therapy.  Monitor for symptoms of orthostasis while losing weight. Continue current regimen and home monitoring for a goal blood pressure of 120/80.

## 2022-11-17 NOTE — Progress Notes (Signed)
Office: [phone]  /  Fax: [phone]   Initial Visit  Daniel Scott was seen in clinic today to evaluate for obesity. He is interested in losing weight to improve overall health and reduce the risk of weight related complications. He presents today to review program treatment options, initial physical assessment, and evaluation.   He has avascular necrosis of the right hip, unknown etiology and is in need of a hip replacement.  He was told he needed to lose 70 pounds or reach a BMI of 40 to be able to qualify for surgery  He was referred by: PCP  When asked what else they would like to accomplish? He states: Improve energy levels and physical activity, Improve existing medical conditions, Reduce number of medications, Reduce risk for a surgery, and Improve quality of life  Weight history:  When asked how has your weight affected you? He states: Contributed to medical problems, Contributed to orthopedic problems or mobility issues, Having fatigue, and Having poor endurance  Some associated conditions: Hypertension, Hyperlipidemia, Diabetes, and Other: AVN right HIP  Contributing factors: Family history, Disruption of circadian rhythm, Nutritional, and Reduced physical activity  Weight promoting medications identified: Beta-blockers  Current nutrition plan: None  Current level of physical activity: None  Current or previous pharmacotherapy: GLP-1  Response to medication: Other: On medication for diabetes   Past medical history includes:   Past Medical History:  Diagnosis Date   Arthritis    hands and feet   Depression    Diabetes mellitus without complication (H[facility])    type 2   Fatty liver    GERD (gastroesophageal reflux disease)    Hiatal hernia    Hyperlipidemia    Hypertension    Hypothyroidism    Neuromuscular disorder ([facility])    stenosis   Pneumonia 1990   Stenosis of cervical spine    Thyroid disease      Objective:   BP 121/77   Pulse 72   Temp 98.1  F (36.7 C)   Ht '5\' 10"'$  (1.778 m)   Wt (!) 333 lb (151 kg)   SpO2 97%   BMI 47.78 kg/m  He was weighed on the bioimpedance scale: Body mass index is 47.78 kg/m.  Peak Weight: 382, Body Fat%: 44, Visceral Fat Rating: 33, Weight trend over the last 12 months: Increasing  General:  Alert, oriented and cooperative. Patient is in no acute distress.  Respiratory: Normal respiratory effort, no problems with respiration noted   Gait: able to ambulate independently  Mental Status: Normal mood and affect. Normal behavior. Normal judgment and thought content.   DIAGNOSTIC DATA REVIEWED:  BMET    Component Value Date/Time   NA 137 06/06/2022 0805   K 5.1 06/06/2022 0805   CL 99 06/06/2022 0805   CO2 23 06/06/2022 0805   GLUCOSE 168 (H) 06/06/2022 0805   GLUCOSE 119 (H) 04/21/2022 0929   BUN 15 06/06/2022 0805   CREATININE 0.77 06/06/2022 0805   CALCIUM 9.5 06/06/2022 0805   GFRNONAA >60 03/23/2022 1113   GFRAA 108 09/25/2020 1034   Lab Results  Component Value Date   HGBA1C 7.1 (H) 09/08/2022   HGBA1C 7.0 (H) 12/09/2019   No results found for: "INSULIN" CBC    Component Value Date/Time   WBC 4.9 06/06/2022 0805   WBC 5.7 03/23/2022 1113   RBC 4.62 06/06/2022 0805   RBC 4.56 03/23/2022 1113   HGB 14.6 06/06/2022 0805   HCT 43.0 06/06/2022 0805   PLT 260  06/06/2022 0805   MCV 93 06/06/2022 0805   MCH 31.6 06/06/2022 0805   MCH 31.6 03/23/2022 1113   MCHC 34.0 06/06/2022 0805   MCHC 33.4 03/23/2022 1113   RDW 13.3 06/06/2022 0805   Iron/TIBC/Ferritin/ %Sat No results found for: "IRON", "TIBC", "FERRITIN", "IRONPCTSAT" Lipid Panel     Component Value Date/Time   CHOL 136 06/06/2022 0805   TRIG 132 06/06/2022 0805   HDL 40 06/06/2022 0805   CHOLHDL 3.4 06/06/2022 0805   LDLCALC 73 06/06/2022 0805   Hepatic Function Panel     Component Value Date/Time   PROT 6.2 06/06/2022 0805   ALBUMIN 4.4 06/06/2022 0805   AST 32 06/06/2022 0805   ALT 41 06/06/2022 0805    ALKPHOS 76 06/06/2022 0805   BILITOT 0.3 06/06/2022 0805      Component Value Date/Time   TSH 1.430 06/06/2022 0805     Assessment and Plan:   Type 2 diabetes mellitus with hyperglycemia, without long-term current use of insulin (HCC) Assessment & Plan: HgbA1c is at goal for age and comorbid conditions. Denies symptoms of hypoglycemia or hyperglycemia. On Trulicity, Jardiance, metformin without any  with good adherence and no side effects.    Lab Results  Component Value Date   HGBA1C 7.1 (H) 09/08/2022   HGBA1C 7.2 (H) 06/06/2022   HGBA1C 7.1 (H) 03/02/2022   Lab Results  Component Value Date   LDLCALC 73 06/06/2022   CREATININE 0.77 06/06/2022   Patient interested in beginning weight loss management program.  Losing 10% of body weight will improve condition.    Hypertension associated with type 2 diabetes mellitus ([place]) Assessment & Plan: Blood pressure at goal for age and risk category.  On amlodipine 10 mg, lisinopril 20 mg, metoprolol 25 mg (which may cause weight gain), spironolactone 25 mg without adverse effects.  Most recent renal parameters reviewed which showed normal electrolytes and kidney function.  Continue with weight loss therapy.  Monitor for symptoms of orthostasis while losing weight. Continue current regimen and home monitoring for a goal blood pressure of 120/80.    Class 3 severe obesity with serious comorbidity and body mass index (BMI) of 45.0 to 49.9 in adult, unspecified obesity type [facility]) Assessment & Plan: We reviewed weight, biometrics, associated medical conditions and contributing factors with patient. She would benefit from weight loss therapy via a modified calorie, low-carb, high-protein nutritional plan tailored to their REE (resting energy expenditure) which will be determined by indirect calorimetry.  We will also assess for cardiometabolic risk and nutritional derangements via fasting serologies at her next appointment.   Suspected  sleep apnea Assessment & Plan: Patient has symptoms suggestive of sleep apnea.  He has been referred for sleep study in the past by another provider.  We discussed the risk associated with undiagnosed and untreated sleep apnea.  He will be reaching out to schedule his sleep study.  Losing 15% of body weight may reduce AHI if he indeed has sleep apnea.         Obesity Treatment / Action Plan:  Patient will work on garnering support from family and friends to begin weight loss journey. Will work on eliminating or reducing the presence of highly palatable, calorie dense foods in the home. Will complete provided nutritional and psychosocial assessment questionnaire before the next appointment. Will be scheduled for indirect calorimetry to determine resting energy expenditure in a fasting state.  This will allow [place] to create a reduced calorie, high-protein meal plan to promote loss  of fat mass while preserving muscle mass. Counseled on the health benefits of losing 5%-15% of total body weight. Will work on improving sleep hygiene and trying to obtain at least 7 hours of sleep. Was counseled on nutritional approaches to weight loss and benefits of complex carbs and high quality protein as part of nutritional weight management. Was counseled on pharmacotherapy and role as an adjunct in weight management.   Obesity Education Performed Today:  He was weighed on the bioimpedance scale and results were discussed and documented in the synopsis.  We discussed obesity as a disease and the importance of a more detailed evaluation of all the factors contributing to the disease.  We discussed the importance of long term lifestyle changes which include nutrition, exercise and behavioral modifications as well as the importance of customizing this to his specific health and social needs.  We discussed the benefits of reaching a healthier weight to alleviate the symptoms of existing conditions and reduce the  risks of the biomechanical, metabolic and psychological effects of obesity.  Daniel Scott appears to be in the action stage of change and states they are ready to start intensive lifestyle modifications and behavioral modifications.  30 minutes was spent today on this visit including the above counseling, pre-visit chart review, and post-visit documentation.  Reviewed by clinician on day of visit: allergies, medications, problem list, medical history, surgical history, family history, social history, and previous encounter notes pertinent to obesity diagnosis.   Thomes Dinning, MD

## 2022-11-17 NOTE — Assessment & Plan Note (Signed)
HgbA1c is at goal for age and comorbid conditions. Denies symptoms of hypoglycemia or hyperglycemia. On Trulicity, Jardiance, metformin without any  with good adherence and no side effects.    Lab Results  Component Value Date   HGBA1C 7.1 (H) 09/08/2022   HGBA1C 7.2 (H) 06/06/2022   HGBA1C 7.1 (H) 03/02/2022   Lab Results  Component Value Date   LDLCALC 73 06/06/2022   CREATININE 0.77 06/06/2022   Patient interested in beginning weight loss management program.  Losing 10% of body weight will improve condition.

## 2022-11-17 NOTE — Assessment & Plan Note (Signed)
Patient has symptoms suggestive of sleep apnea.  He has been referred for sleep study in the past by another provider.  We discussed the risk associated with undiagnosed and untreated sleep apnea.  He will be reaching out to schedule his sleep study.  Losing 15% of body weight may reduce AHI if he indeed has sleep apnea.

## 2022-11-17 NOTE — Assessment & Plan Note (Signed)
We reviewed weight, biometrics, associated medical conditions and contributing factors with patient. She would benefit from weight loss therapy via a modified calorie, low-carb, high-protein nutritional plan tailored to their REE (resting energy expenditure) which will be determined by indirect calorimetry.  We will also assess for cardiometabolic risk and nutritional derangements via fasting serologies at her next appointment. 

## 2022-11-23 NOTE — Telephone Encounter (Signed)
Called spoke to patient - Appointment schedule for May . Patient preferred to  wait until May 2024  He verbalized understanding date and time.

## 2022-11-28 ENCOUNTER — Ambulatory Visit: Payer: 59 | Admitting: Orthopaedic Surgery

## 2022-11-28 ENCOUNTER — Encounter: Payer: Self-pay | Admitting: Orthopaedic Surgery

## 2022-11-28 VITALS — Wt 336.0 lb

## 2022-11-28 DIAGNOSIS — M25551 Pain in right hip: Secondary | ICD-10-CM | POA: Diagnosis not present

## 2022-11-28 DIAGNOSIS — M87051 Idiopathic aseptic necrosis of right femur: Secondary | ICD-10-CM

## 2022-11-28 DIAGNOSIS — M1611 Unilateral primary osteoarthritis, right hip: Secondary | ICD-10-CM | POA: Diagnosis not present

## 2022-11-28 NOTE — Progress Notes (Signed)
The patient is a 62 year old gentleman well-known to [place].  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  He still reports daily hip pain.  He came in today for a weight check and a BMI check.  His BMI is 48.21 today.  He reports the same amount of right hip pain.  It has not worsened..  It hurts mainly with weightbearing.  I can easily put his right hip through internal and external rotation with just some discomfort but it is not severe.  He does not walk with assistive device.  He understands that it is essential that we have him lose more weight in order to safely perform surgery.  He does have an A1c that is being checked coming up.  His last 1 was 7.1.  I would like to see him back in 6 weeks.  I would like a new weight and BMI check.  If his hip pain is worsening it would be medically warranted to repeat an MRI of his right hip.  If the avascular porosis aspect of the knee is worsening we would have to consider a hip replacement in spite of his weight.  He understands we need to have him continue to work on blood glucose control as well.

## 2022-11-30 ENCOUNTER — Ambulatory Visit (INDEPENDENT_AMBULATORY_CARE_PROVIDER_SITE_OTHER): Payer: 59 | Admitting: Internal Medicine

## 2022-11-30 ENCOUNTER — Encounter (INDEPENDENT_AMBULATORY_CARE_PROVIDER_SITE_OTHER): Payer: Self-pay | Admitting: Internal Medicine

## 2022-11-30 VITALS — BP 122/82 | HR 80 | Temp 97.7°F | Ht 70.0 in | Wt 327.0 lb

## 2022-11-30 DIAGNOSIS — Z7985 Long-term (current) use of injectable non-insulin antidiabetic drugs: Secondary | ICD-10-CM | POA: Diagnosis not present

## 2022-11-30 DIAGNOSIS — Z7984 Long term (current) use of oral hypoglycemic drugs: Secondary | ICD-10-CM

## 2022-11-30 DIAGNOSIS — E1169 Type 2 diabetes mellitus with other specified complication: Secondary | ICD-10-CM | POA: Diagnosis not present

## 2022-11-30 DIAGNOSIS — E1165 Type 2 diabetes mellitus with hyperglycemia: Secondary | ICD-10-CM

## 2022-11-30 DIAGNOSIS — R5383 Other fatigue: Secondary | ICD-10-CM | POA: Diagnosis not present

## 2022-11-30 DIAGNOSIS — E785 Hyperlipidemia, unspecified: Secondary | ICD-10-CM

## 2022-11-30 DIAGNOSIS — R0602 Shortness of breath: Secondary | ICD-10-CM | POA: Insufficient documentation

## 2022-11-30 DIAGNOSIS — Z9884 Bariatric surgery status: Secondary | ICD-10-CM

## 2022-11-30 DIAGNOSIS — R29818 Other symptoms and signs involving the nervous system: Secondary | ICD-10-CM | POA: Diagnosis not present

## 2022-11-30 DIAGNOSIS — I152 Hypertension secondary to endocrine disorders: Secondary | ICD-10-CM | POA: Diagnosis not present

## 2022-11-30 DIAGNOSIS — Z1331 Encounter for screening for depression: Secondary | ICD-10-CM | POA: Insufficient documentation

## 2022-11-30 DIAGNOSIS — E1159 Type 2 diabetes mellitus with other circulatory complications: Secondary | ICD-10-CM

## 2022-11-30 DIAGNOSIS — Z6841 Body Mass Index (BMI) 40.0 and over, adult: Secondary | ICD-10-CM

## 2022-11-30 NOTE — Assessment & Plan Note (Signed)
Patient has symptoms suspicious for sleep disordered breathing.  He will be further screened at the next office visit.  We will consider polysomnography.  He had been referred for this in the past but was not scheduled.

## 2022-11-30 NOTE — Assessment & Plan Note (Signed)
Blood pressure at goal for age and risk category.  On spironolactone, amlodipine, lisinopril, metoprolol (may cause weight gain) without adverse effects.  Most recent renal parameters reviewed which showed normal electrolytes and kidney function.  Continue with weight loss therapy.  Monitor for symptoms of orthostasis while losing weight. Continue current regimen and home monitoring for a goal blood pressure of 120/80.

## 2022-11-30 NOTE — Progress Notes (Unsigned)
Chief Complaint:   OBESITY Daniel Scott (MR# [id number]) is a 62 y.o. male who presents for evaluation and treatment of obesity and related comorbidities. Current BMI is Body mass index is 46.92 kg/m. [name] has been struggling with his weight for many years and has been unsuccessful in either losing weight, maintaining weight loss, or reaching his healthy weight goal.  [name] is currently in the action stage of change and ready to dedicate time achieving and maintaining a healthier weight. [name] is interested in becoming our patient and working on intensive lifestyle modifications including (but not limited to) diet and exercise for weight loss.  [name]'s habits were reviewed today and are as follows: he thinks his family will eat healthier with him, his desired weight loss is 52 lbs, he has been heavy most of his life, he started gaining weight in 1982, his heaviest weight ever was 383 pounds, he has significant food cravings issues, he skips meals frequently, he is frequently drinking liquids with calories, he frequently eats larger portions than normal, and he struggles with emotional eating.  Depression Screen [name]'s Food and Mood (modified PHQ-9) score was 12.  Subjective:   1. Other fatigue Petro admits to daytime somnolence and admits to waking up still tired. Patient has a history of symptoms of daytime fatigue and morning fatigue. Daniel Scott generally gets 7 or 8 hours of sleep per night, and states that he has nightime awakenings and generally restful sleep. Snoring is present. Apneic episodes are present. [name] is 4.   2. SOB (shortness of breath) on exertion [name] notes increasing shortness of breath with exercising and seems to be worsening over time with weight gain. He notes getting out of breath sooner with activity than he used to. This has not gotten worse recently. [name] denies shortness of breath at rest or orthopnea.  3. Type 2 diabetes mellitus with  hyperglycemia, without long-term current use of insulin (HCC) HgbA1c is not at goal for age and comorbid conditions. Denies symptoms of hypoglycemia or hyperglycemia. On Trulicity, Jardiance, metformin with good adherence and no side effects.   Lab Results  Component Value Date   HGBA1C 7.1 (H) 09/08/2022   HGBA1C 7.2 (H) 06/06/2022   HGBA1C 7.1 (H) 03/02/2022   Lab Results  Component Value Date   LDLCALC 73 06/06/2022   CREATININE 0.77 06/06/2022   4. Hyperlipidemia associated with type 2 diabetes mellitus (HCC) LDL is at goal. Elevated LDL may be secondary to nutrition, genetics and spillover effect from excess adiposity. Recommended LDL goal is <70 to reduce the risk of fatty streaks and the progression to obstructive ASCVD in the future. His 10 year risk is: The 10-year ASCVD risk score (Arnett DK, et al., 2019) is: 18.4%.  Lab Results  Component Value Date   CHOL 136 06/06/2022   HDL 40 06/06/2022   LDLCALC 73 06/06/2022   TRIG 132 06/06/2022   CHOLHDL 3.4 06/06/2022   5. LAP-BAND surgery status Stable no signs or symptoms of complication.  6. Hypertension associated with type 2 diabetes mellitus ([place]) Blood pressure at goal for age and risk category.  On spironolactone, amlodipine, lisinopril, metoprolol (may cause weight gain) without adverse effects.  Most recent renal parameters reviewed which showed normal electrolytes and kidney function.  7. Suspected sleep apnea Patient has symptoms suspicious for sleep disordered breathing.    Assessment/Plan:   1. Other fatigue Daniel Scott does feel that his weight is causing his energy to be lower than  it should be. Fatigue may be related to obesity, depression or many other causes. Labs will be ordered, and in the meanwhile, Lumir will focus on self care including making healthy food choices, increasing physical activity and focusing on stress reduction.  - EKG 12-Lead  2. SOB (shortness of breath) on exertion Daniel Scott does feel that  he gets out of breath more easily that he used to when he exercises. Daniel Scott's shortness of breath appears to be obesity related and exercise induced. He has agreed to work on weight loss and gradually increase exercise to treat his exercise induced shortness of breath. Will continue to monitor closely.  3. Type 2 diabetes mellitus with hyperglycemia, without long-term current use of insulin (HCC) We are checking disease monitoring labs today.  He may benefit to switching from Trulicity to [name] or [name] if covered by insurance and not cost prohibitive, as these have a higher hypothalamic affinity and may help with weight loss.  Will defer decision to primary care team. Counseled on goals of care, monitoring for complications and importance of staying updated on immunizations and diabetes preventive measures. Continue with reduced calorie meal plan low on processed crabs and simple sugars. Ongoing weight loss will improve insulin resistance and glycemic control.  - Comprehensive metabolic panel - Hemoglobin A1c - Insulin, random - Urine Microalbumin w/creat. ratio  4. Hyperlipidemia associated with type 2 diabetes mellitus ([name]) He is currently on atorvastatin high intensity without adverse effects.  Continue weight loss therapy, losing 10% or more of body weight may improve condition. Also advised to reduce saturated fats in diet to less than 10% of daily calories.    - Lipid Panel With LDL/HDL Ratio  5. LAP-BAND surgery status Stable no signs or symptoms of complication.  6. Hypertension associated with type 2 diabetes mellitus ([name]) Continue with weight loss therapy.  Monitor for symptoms of orthostasis while losing weight. Continue current regimen and home monitoring for a goal blood pressure of 120/80.  7. Suspected sleep apnea He will be further screened at the next office visit.  We will consider polysomnography.  He had been referred for this in the past but was not scheduled.  8.  Depression screen Daniel Scott had a positive depression screening. Depression is commonly associated with obesity and often results in emotional eating behaviors. We will monitor this closely and work on CBT to help improve the non-hunger eating patterns. Referral to Psychology may be required if no improvement is seen as he continues in our clinic.  9. Class 3 severe obesity with serious comorbidity and body mass index (BMI) of 45.0 to 49.9 in adult, unspecified obesity type ([place]) We will check labs today.   - VITAMIN D 25 Hydroxy (Vit-D Deficiency, Fractures)  Anothny is currently in the action stage of change and his goal is to continue with weight loss efforts. I recommend Spiridon begin the structured treatment plan as follows:  He has agreed to the Category 4 Plan.  Exercise goals: All adults should avoid inactivity. Some physical activity is better than none, and adults who participate in any amount of physical activity gain some health benefits.   Behavioral modification strategies: increasing lean protein intake, decreasing simple carbohydrates, increasing vegetables, increasing water intake, decreasing liquid calories, decreasing alcohol intake, increasing high fiber foods, no skipping meals, meal planning and cooking strategies, keeping healthy foods in the home, better snacking choices, and planning for success.  He was informed of the importance of frequent follow-up visits to maximize his success with  intensive lifestyle modifications for his multiple health conditions. He was informed we would discuss his lab results at his next visit unless there is a critical issue that needs to be addressed sooner. Azzam agreed to keep his next visit at the agreed upon time to discuss these results.  Objective:   Blood pressure 122/82, pulse 80, temperature 97.7 F (36.5 C), height 5\' 10"  (1.778 m), weight (!) 327 lb (148.3 kg), SpO2 97 %. Body mass index is 46.92 kg/m.  EKG: Normal sinus rhythm, rate  77 BPM.  Indirect Calorimeter completed today shows a VO2 of 354 and a REE of 3435.  His calculated basal metabolic rate is [id number] thus his basal metabolic rate is better than expected.  General: Cooperative, alert, well developed, in no acute distress. HEENT: Conjunctivae and lids unremarkable. Cardiovascular: Regular rhythm.  Lungs: Normal work of breathing. Neurologic: No focal deficits.   Lab Results  Component Value Date   CREATININE 0.88 11/30/2022   BUN 13 11/30/2022   NA 141 11/30/2022   K 5.0 11/30/2022   CL 99 11/30/2022   CO2 23 11/30/2022   Lab Results  Component Value Date   ALT 70 (H) 11/30/2022   AST 46 (H) 11/30/2022   ALKPHOS 80 11/30/2022   BILITOT 0.4 11/30/2022   Lab Results  Component Value Date   HGBA1C 6.9 (H) 11/30/2022   HGBA1C 7.1 (H) 09/08/2022   HGBA1C 7.2 (H) 06/06/2022   HGBA1C 7.1 (H) 03/02/2022   HGBA1C 8.0 (H) 11/30/2021   Lab Results  Component Value Date   INSULIN 14.8 11/30/2022   Lab Results  Component Value Date   TSH 1.430 06/06/2022   Lab Results  Component Value Date   CHOL 130 11/30/2022   HDL 42 11/30/2022   LDLCALC 66 11/30/2022   TRIG 123 11/30/2022   CHOLHDL 3.4 06/06/2022   Lab Results  Component Value Date   WBC 4.9 06/06/2022   HGB 14.6 06/06/2022   HCT 43.0 06/06/2022   MCV 93 06/06/2022   PLT 260 06/06/2022   No results found for: "IRON", "TIBC", "FERRITIN"  Attestation Statements:   Reviewed by clinician on day of visit: allergies, medications, problem list, medical history, surgical history, family history, social history, and previous encounter notes.  Time spent on visit including pre-visit chart review and post-visit charting and care was 40 minutes.   Wilhemena Durie, am acting as transcriptionist for Thomes Dinning, MD.  I have reviewed the above documentation for accuracy and completeness, and I agree with the above. -Thomes Dinning, MD

## 2022-11-30 NOTE — Assessment & Plan Note (Signed)
HgbA1c is not at goal for age and comorbid conditions. Denies symptoms of hypoglycemia or hyperglycemia. On Trulicity, Jardiance, metformin with good adherence and no side effects.   Counseled on goals of care, monitoring for complications and importance of staying updated on immunizations and diabetes preventive measures. Continue with reduced calorie meal plan low on processed crabs and simple sugars. Ongoing weight loss will improve insulin resistance and glycemic control  Lab Results  Component Value Date   HGBA1C 7.1 (H) 09/08/2022   HGBA1C 7.2 (H) 06/06/2022   HGBA1C 7.1 (H) 03/02/2022   Lab Results  Component Value Date   LDLCALC 73 06/06/2022   CREATININE 0.77 06/06/2022   We are checking disease monitoring labs today.  He may benefit to switching from Trulicity to [name] or [name] if covered by insurance and not cost prohibitive, as these have a higher hypothalamic affinity and may help with weight loss.  Will defer decision to primary care team

## 2022-11-30 NOTE — Assessment & Plan Note (Signed)
LDL is at goal. Elevated LDL may be secondary to nutrition, genetics and spillover effect from excess adiposity. Recommended LDL goal is <70 to reduce the risk of fatty streaks and the progression to obstructive ASCVD in the future. His 10 year risk is: The 10-year ASCVD risk score (Arnett DK, et al., 2019) is: 18.4%  Lab Results  Component Value Date   CHOL 136 06/06/2022   HDL 40 06/06/2022   LDLCALC 73 06/06/2022   TRIG 132 06/06/2022   CHOLHDL 3.4 06/06/2022    He is currently on atorvastatin high intensity without adverse effects.  Continue weight loss therapy, losing 10% or more of body weight may improve condition. Also advised to reduce saturated fats in diet to less than 10% of daily calories.

## 2022-11-30 NOTE — Assessment & Plan Note (Signed)
Stable no signs or symptoms of complication.

## 2022-12-01 LAB — COMPREHENSIVE METABOLIC PANEL
ALT: 70 IU/L — ABNORMAL HIGH (ref 0–44)
AST: 46 IU/L — ABNORMAL HIGH (ref 0–40)
Albumin/Globulin Ratio: 2.5 — ABNORMAL HIGH (ref 1.2–2.2)
Albumin: 4.9 g/dL (ref 3.9–4.9)
Alkaline Phosphatase: 80 IU/L (ref 44–121)
BUN/Creatinine Ratio: 15 (ref 10–24)
BUN: 13 mg/dL (ref 8–27)
Bilirubin Total: 0.4 mg/dL (ref 0.0–1.2)
CO2: 23 mmol/L (ref 20–29)
Calcium: 10.1 mg/dL (ref 8.6–10.2)
Chloride: 99 mmol/L (ref 96–106)
Creatinine, Ser: 0.88 mg/dL (ref 0.76–1.27)
Globulin, Total: 2 g/dL (ref 1.5–4.5)
Glucose: 112 mg/dL — ABNORMAL HIGH (ref 70–99)
Potassium: 5 mmol/L (ref 3.5–5.2)
Sodium: 141 mmol/L (ref 134–144)
Total Protein: 6.9 g/dL (ref 6.0–8.5)
eGFR: 97 mL/min/{1.73_m2} (ref >59)

## 2022-12-01 LAB — LIPID PANEL WITH LDL/HDL RATIO
Cholesterol, Total: 130 mg/dL (ref 100–199)
HDL: 42 mg/dL (ref >39)
LDL Chol Calc (NIH): 66 mg/dL (ref 0–99)
LDL/HDL Ratio: 1.6 ratio (ref 0.0–3.6)
Triglycerides: 123 mg/dL (ref 0–149)
VLDL Cholesterol Cal: 22 mg/dL (ref 5–40)

## 2022-12-01 LAB — INSULIN, RANDOM: INSULIN: 14.8 u[IU]/mL (ref 2.6–24.9)

## 2022-12-01 LAB — MICROALBUMIN / CREATININE URINE RATIO
Creatinine, Urine: 83.2 mg/dL
Microalb/Creat Ratio: 5 mg/g creat (ref 0–29)
Microalbumin, Urine: 4.5 ug/mL

## 2022-12-01 LAB — VITAMIN D 25 HYDROXY (VIT D DEFICIENCY, FRACTURES): Vit D, 25-Hydroxy: 77.6 ng/mL (ref 30.0–100.0)

## 2022-12-01 LAB — HEMOGLOBIN A1C
Est. average glucose Bld gHb Est-mCnc: 151 mg/dL
Hgb A1c MFr Bld: 6.9 % — ABNORMAL HIGH (ref 4.8–5.6)

## 2022-12-06 ENCOUNTER — Other Ambulatory Visit: Payer: Self-pay | Admitting: Family Medicine

## 2022-12-06 ENCOUNTER — Encounter (HOSPITAL_COMMUNITY)
Admission: RE | Admit: 2022-12-06 | Discharge: 2022-12-06 | Disposition: A | Discharge status: Home / Self Care | Payer: 59 | Source: Ambulatory Visit | Attending: Internal Medicine | Admitting: Internal Medicine

## 2022-12-06 ENCOUNTER — Encounter (HOSPITAL_COMMUNITY): Payer: Self-pay

## 2022-12-06 DIAGNOSIS — E1165 Type 2 diabetes mellitus with hyperglycemia: Secondary | ICD-10-CM

## 2022-12-06 HISTORY — DX: Other specified postprocedural states: Z98.890

## 2022-12-06 HISTORY — DX: Nausea with vomiting, unspecified: R11.2

## 2022-12-06 NOTE — Progress Notes (Signed)
   12/06/22 1018  OBSTRUCTIVE SLEEP APNEA  Have you ever been diagnosed with sleep apnea through a sleep study? No  Do you snore loudly (loud enough to be heard through closed doors)?  1  Do you often feel tired, fatigued, or sleepy during the daytime (such as falling asleep during driving or talking to someone)? 1  Has anyone observed you stop breathing during your sleep? 1  Do you have, or are you being treated for high blood pressure? 1  BMI more than 35 kg/m2? 1  Age > 50 (1-yes) 1  Neck circumference greater than:Male 16 inches or larger, Male 17inches or larger? 0  Male Gender (Yes=1) 1  Obstructive Sleep Apnea Score 7  Score 5 or greater  Results sent to PCP

## 2022-12-08 ENCOUNTER — Ambulatory Visit (HOSPITAL_BASED_OUTPATIENT_CLINIC_OR_DEPARTMENT_OTHER): Payer: 59 | Admitting: Certified Registered Nurse Anesthetist

## 2022-12-08 ENCOUNTER — Encounter (HOSPITAL_COMMUNITY): Payer: Self-pay

## 2022-12-08 ENCOUNTER — Ambulatory Visit (HOSPITAL_COMMUNITY)
Admission: RE | Admit: 2022-12-08 | Discharge: 2022-12-08 | Disposition: A | Discharge status: Home / Self Care | Payer: 59 | Source: Ambulatory Visit | Attending: Internal Medicine | Admitting: Internal Medicine

## 2022-12-08 ENCOUNTER — Encounter (HOSPITAL_COMMUNITY)
Admission: RE | Disposition: A | Discharge status: Home / Self Care | Payer: Self-pay | Source: Ambulatory Visit | Attending: Internal Medicine

## 2022-12-08 ENCOUNTER — Ambulatory Visit (HOSPITAL_COMMUNITY): Payer: 59 | Admitting: Certified Registered Nurse Anesthetist

## 2022-12-08 DIAGNOSIS — K648 Other hemorrhoids: Secondary | ICD-10-CM | POA: Diagnosis not present

## 2022-12-08 DIAGNOSIS — K5730 Diverticulosis of large intestine without perforation or abscess without bleeding: Secondary | ICD-10-CM | POA: Insufficient documentation

## 2022-12-08 DIAGNOSIS — Z7985 Long-term (current) use of injectable non-insulin antidiabetic drugs: Secondary | ICD-10-CM | POA: Insufficient documentation

## 2022-12-08 DIAGNOSIS — E039 Hypothyroidism, unspecified: Secondary | ICD-10-CM | POA: Insufficient documentation

## 2022-12-08 DIAGNOSIS — Z800 Family history of malignant neoplasm of digestive organs: Secondary | ICD-10-CM

## 2022-12-08 DIAGNOSIS — D175 Benign lipomatous neoplasm of intra-abdominal organs: Secondary | ICD-10-CM | POA: Insufficient documentation

## 2022-12-08 DIAGNOSIS — Z79899 Other long term (current) drug therapy: Secondary | ICD-10-CM | POA: Insufficient documentation

## 2022-12-08 DIAGNOSIS — K635 Polyp of colon: Secondary | ICD-10-CM

## 2022-12-08 DIAGNOSIS — Z86010 Personal history of colonic polyps: Secondary | ICD-10-CM | POA: Diagnosis not present

## 2022-12-08 DIAGNOSIS — D123 Benign neoplasm of transverse colon: Secondary | ICD-10-CM | POA: Diagnosis not present

## 2022-12-08 DIAGNOSIS — D125 Benign neoplasm of sigmoid colon: Secondary | ICD-10-CM | POA: Diagnosis not present

## 2022-12-08 DIAGNOSIS — E119 Type 2 diabetes mellitus without complications: Secondary | ICD-10-CM | POA: Diagnosis not present

## 2022-12-08 DIAGNOSIS — K5790 Diverticulosis of intestine, part unspecified, without perforation or abscess without bleeding: Secondary | ICD-10-CM | POA: Diagnosis not present

## 2022-12-08 DIAGNOSIS — Z9884 Bariatric surgery status: Secondary | ICD-10-CM | POA: Diagnosis not present

## 2022-12-08 DIAGNOSIS — I5032 Chronic diastolic (congestive) heart failure: Secondary | ICD-10-CM | POA: Diagnosis not present

## 2022-12-08 DIAGNOSIS — K649 Unspecified hemorrhoids: Secondary | ICD-10-CM | POA: Diagnosis not present

## 2022-12-08 DIAGNOSIS — Z1211 Encounter for screening for malignant neoplasm of colon: Secondary | ICD-10-CM | POA: Diagnosis not present

## 2022-12-08 DIAGNOSIS — Z87891 Personal history of nicotine dependence: Secondary | ICD-10-CM | POA: Diagnosis not present

## 2022-12-08 DIAGNOSIS — D120 Benign neoplasm of cecum: Secondary | ICD-10-CM | POA: Insufficient documentation

## 2022-12-08 DIAGNOSIS — I10 Essential (primary) hypertension: Secondary | ICD-10-CM

## 2022-12-08 DIAGNOSIS — Z09 Encounter for follow-up examination after completed treatment for conditions other than malignant neoplasm: Secondary | ICD-10-CM | POA: Insufficient documentation

## 2022-12-08 DIAGNOSIS — I110 Hypertensive heart disease with heart failure: Secondary | ICD-10-CM | POA: Insufficient documentation

## 2022-12-08 DIAGNOSIS — K219 Gastro-esophageal reflux disease without esophagitis: Secondary | ICD-10-CM | POA: Diagnosis not present

## 2022-12-08 DIAGNOSIS — Z7984 Long term (current) use of oral hypoglycemic drugs: Secondary | ICD-10-CM | POA: Diagnosis not present

## 2022-12-08 HISTORY — PX: POLYPECTOMY: SHX5525

## 2022-12-08 HISTORY — PX: COLONOSCOPY WITH PROPOFOL: SHX5780

## 2022-12-08 LAB — GLUCOSE, CAPILLARY: Glucose-Capillary: 160 mg/dL — ABNORMAL HIGH (ref 70–99)

## 2022-12-08 SURGERY — COLONOSCOPY WITH PROPOFOL
Anesthesia: General

## 2022-12-08 MED ORDER — LIDOCAINE HCL (CARDIAC) PF 100 MG/5ML IV SOSY
PREFILLED_SYRINGE | INTRAVENOUS | Status: DC | PRN
  Administered 2022-12-08: 50 mg via INTRAVENOUS

## 2022-12-08 MED ORDER — PROPOFOL 10 MG/ML IV BOLUS
INTRAVENOUS | Status: DC | PRN
  Administered 2022-12-08: 100 mg via INTRAVENOUS

## 2022-12-08 MED ORDER — PROPOFOL 500 MG/50ML IV EMUL
INTRAVENOUS | Status: DC | PRN
  Administered 2022-12-08: 150 ug/kg/min via INTRAVENOUS

## 2022-12-08 MED ORDER — LACTATED RINGERS IV SOLN: INTRAVENOUS | Status: DC

## 2022-12-08 NOTE — Discharge Instructions (Addendum)
?  Colonoscopy ?Discharge Instructions ? ?Read the instructions outlined below and refer to this sheet in the next few weeks. These discharge instructions provide you with general information on caring for yourself after you leave the hospital. Your doctor may also give you specific instructions. While your treatment has been planned according to the most current medical practices available, unavoidable complications occasionally occur.  ? ?ACTIVITY ?You may resume your regular activity, but move at a slower pace for the next 24 hours.  ?Take frequent rest periods for the next 24 hours.  ?Walking will help get rid of the air and reduce the bloated feeling in your belly (abdomen).  ?No driving for 24 hours (because of the medicine (anesthesia) used during the test).   ?Do not sign any important legal documents or operate any machinery for 24 hours (because of the anesthesia used during the test).  ?NUTRITION ?Drink plenty of fluids.  ?You may resume your normal diet as instructed by your doctor.  ?Begin with a light meal and progress to your normal diet. Heavy or fried foods are harder to digest and may make you feel sick to your stomach (nauseated).  ?Avoid alcoholic beverages for 24 hours or as instructed.  ?MEDICATIONS ?You may resume your normal medications unless your doctor tells you otherwise.  ?WHAT YOU CAN EXPECT TODAY ?Some feelings of bloating in the abdomen.  ?Passage of more gas than usual.  ?Spotting of blood in your stool or on the toilet paper.  ?IF YOU HAD POLYPS REMOVED DURING THE COLONOSCOPY: ?No aspirin products for 7 days or as instructed.  ?No alcohol for 7 days or as instructed.  ?Eat a soft diet for the next 24 hours.  ?FINDING OUT THE RESULTS OF YOUR TEST ?Not all test results are available during your visit. If your test results are not back during the visit, make an appointment with your caregiver to find out the results. Do not assume everything is normal if you have not heard from your  caregiver or the medical facility. It is important for you to follow up on all of your test results.  ?SEEK IMMEDIATE MEDICAL ATTENTION IF: ?You have more than a spotting of blood in your stool.  ?Your belly is swollen (abdominal distention).  ?You are nauseated or vomiting.  ?You have a temperature over 101.  ?You have abdominal pain or discomfort that is severe or gets worse throughout the day.  ? ?Your colonoscopy revealed 3 polyp(s) which I removed successfully. Await pathology results, my office will contact you. I recommend repeating colonoscopy in 5 years for surveillance purposes. You also have diverticulosis and internal hemorrhoids. I would recommend increasing fiber in your diet or adding OTC Benefiber/Metamucil. Be sure to drink at least 4 to 6 glasses of water daily. Follow-up with GI as needed. ? ? ? ?I hope you have a great rest of your week! ? ?Charles K. Carver, D.O. ?Gastroenterology and Hepatology ?[facility] ? ?

## 2022-12-08 NOTE — Anesthesia Preprocedure Evaluation (Signed)
Anesthesia Evaluation  Patient identified by MRN, date of birth, ID band Patient awake    Reviewed: Allergy & Precautions, H&P , NPO status , Patient's Chart, lab work & pertinent test results, reviewed documented beta blocker date and time   History of Anesthesia Complications (+) PONV and history of anesthetic complications  Airway Mallampati: II  TM Distance: >3 FB Neck ROM: full    Dental no notable dental hx.    Pulmonary neg pulmonary ROS, pneumonia, Patient abstained from smoking., former smoker   Pulmonary exam normal breath sounds clear to auscultation       Cardiovascular Exercise Tolerance: Good hypertension, negative cardio ROS  Rhythm:regular Rate:Normal     Neuro/Psych  PSYCHIATRIC DISORDERS Anxiety Depression     Neuromuscular disease negative neurological ROS  negative psych ROS   GI/Hepatic negative GI ROS, Neg liver ROS, hiatal hernia,GERD  ,,  Endo/Other  diabetesHypothyroidism  Morbid obesity  Renal/GU Renal diseasenegative Renal ROS  negative genitourinary   Musculoskeletal   Abdominal   Peds  Hematology negative hematology ROS (+)   Anesthesia Other Findings   Reproductive/Obstetrics negative OB ROS                             Anesthesia Physical Anesthesia Plan  ASA: 3  Anesthesia Plan: General   Post-op Pain Management:    Induction:   PONV Risk Score and Plan: Propofol infusion  Airway Management Planned:   Additional Equipment:   Intra-op Plan:   Post-operative Plan:   Informed Consent: I have reviewed the patients History and Physical, chart, labs and discussed the procedure including the risks, benefits and alternatives for the proposed anesthesia with the patient or authorized representative who has indicated his/her understanding and acceptance.     Dental Advisory Given  Plan Discussed with: CRNA  Anesthesia Plan Comments:         Anesthesia Quick Evaluation

## 2022-12-08 NOTE — Op Note (Signed)
[facility] Patient Name: Daniel Scott Procedure Date: 12/08/2022 9:44 AM MRN: [id number] Date of Birth: 27-Nov-1960 Attending MD: Elon Alas. Edgar Frisk, [id number] CSN: [id number] Age: 62 Admit Type: Outpatient Procedure:                Colonoscopy Indications:              Screening in patient at increased risk: Colorectal                            cancer in father before age 54 Providers:                Elon Alas. Abbey Chatters, DO, Janeece Riggers, RN, Ladoris Gene                            Technician, Technician Referring MD:              Medicines:                See the Anesthesia note for documentation of the                            administered medications Complications:            No immediate complications. Estimated Blood Loss:     Estimated blood loss was minimal. Procedure:                Pre-Anesthesia Assessment:                           - The anesthesia plan was to use monitored                            anesthesia care (MAC).                           After obtaining informed consent, the colonoscope                            was passed under direct vision. Throughout the                            procedure, the patient's blood pressure, pulse, and                            oxygen saturations were monitored continuously. The                            [id number]-[id number] scope was introduced through                            the anus and advanced to the the cecum, identified                            by appendiceal orifice and ileocecal valve. The                            colonoscopy was  performed without difficulty. The                            patient tolerated the procedure well. The quality                            of the bowel preparation was evaluated using the                            BBPS [facility] Bowel Preparation Scale) with scores                            of: Right Colon = 2 (minor amount of residual                            staining,  small fragments of stool and/or opaque                            liquid, but mucosa seen well), Transverse Colon = 3                            (entire mucosa seen well with no residual staining,                            small fragments of stool or opaque liquid) and Left                            Colon = 3 (entire mucosa seen well with no residual                            staining, small fragments of stool or opaque                            liquid). The total BBPS score equals 8. The quality                            of the bowel preparation was good. Scope In: 9:57:46 AM Scope Out: 10:17:26 AM Scope Withdrawal Time: 0 hours 15 minutes 41 seconds  Total Procedure Duration: 0 hours 19 minutes 40 seconds  Findings:      Hemorrhoids were found on perianal exam.      Non-bleeding internal hemorrhoids were found during endoscopy.      Multiple medium-mouthed diverticula were found in the sigmoid colon and       ascending colon.      There was a small lipoma, in the sigmoid colon.      Two sessile polyps were found in the transverse colon. The polyps were 4       to 5 mm in size. These polyps were removed with a cold snare. Resection       and retrieval were complete.      A 3 mm polyp was found in the cecum. The polyp was sessile. The polyp       was removed with a cold snare. Resection and retrieval were complete.  The exam was otherwise without abnormality. Impression:               - Hemorrhoids found on perianal exam.                           - Non-bleeding internal hemorrhoids.                           - Diverticulosis in the sigmoid colon and in the                            ascending colon.                           - Small lipoma in the sigmoid colon.                           - Two 4 to 5 mm polyps in the transverse colon,                            removed with a cold snare. Resected and retrieved.                           - One 3 mm polyp in the cecum, removed  with a cold                            snare. Resected and retrieved.                           - The examination was otherwise normal. Moderate Sedation:      Per Anesthesia Care Recommendation:           - Patient has a contact number available for                            emergencies. The signs and symptoms of potential                            delayed complications were discussed with the                            patient. Return to normal activities tomorrow.                            Written discharge instructions were provided to the                            patient.                           - Resume previous diet.                           - Continue present medications.                           -  Await pathology results.                           - Repeat colonoscopy in 5 years for surveillance                            and family history of colon cancer in father                           - Return to GI clinic PRN. Procedure Code(s):        --- Professional ---                           [id number], Colonoscopy, flexible; with removal of                            tumor(s), polyp(s), or other lesion(s) by snare                            technique Diagnosis Code(s):        --- Professional ---                           Z80.0, Family history of malignant neoplasm of                            digestive organs                           K64.8, Other hemorrhoids                           D17.5, Benign lipomatous neoplasm of                            intra-abdominal organs                           D12.3, Benign neoplasm of transverse colon (hepatic                            flexure or splenic flexure)                           D12.0, Benign neoplasm of cecum                           K57.30, Diverticulosis of large intestine without                            perforation or abscess without bleeding CPT copyright 2022 American Medical Association. All rights reserved. The  codes documented in this report are preliminary and upon coder review may  be revised to meet current compliance requirements. Elon Alas. Abbey Chatters, DO [place] Abbey Chatters, DO 12/08/2022 10:20:27 AM This report has been signed electronically. Number of Addenda: 0

## 2022-12-08 NOTE — Transfer of Care (Signed)
Immediate Anesthesia Transfer of Care Note  Patient: Daniel Scott  Procedure(s) Performed: COLONOSCOPY WITH PROPOFOL POLYPECTOMY  Patient Location: Short Stay  Anesthesia Type:General  Level of Consciousness: awake, alert , and oriented  Airway & Oxygen Therapy: Patient Spontanous Breathing  Post-op Assessment: Report given to RN and Post -op Vital signs reviewed and stable  Post vital signs: Reviewed and stable  Last Vitals:  Vitals Value Taken Time  BP    Temp 36.4 C 12/08/22 1022  Pulse 70 12/08/22 1022  Resp 12 12/08/22 1022  SpO2 96 % 12/08/22 1022    Last Pain:  Vitals:   12/08/22 1022  TempSrc: Oral  PainSc:          Complications: No notable events documented.

## 2022-12-08 NOTE — Interval H&P Note (Signed)
History and Physical Interval Note:  12/08/2022 8:59 AM  Daniel Scott  has presented today for surgery, with the diagnosis of family history of colon cancer, history of colon polyps.  The various methods of treatment have been discussed with the patient and family. After consideration of risks, benefits and other options for treatment, the patient has consented to  Procedure(s) with comments: COLONOSCOPY WITH PROPOFOL (N/A) - 10:00 am , asa 3 as a surgical intervention.  The patient's history has been reviewed, patient examined, no change in status, stable for surgery.  I have reviewed the patient's chart and labs.  Questions were answered to the patient's satisfaction.     Daniel Scott

## 2022-12-09 ENCOUNTER — Other Ambulatory Visit: Payer: Self-pay | Admitting: Family Medicine

## 2022-12-09 ENCOUNTER — Ambulatory Visit: Payer: 59 | Admitting: Family Medicine

## 2022-12-09 DIAGNOSIS — E1165 Type 2 diabetes mellitus with hyperglycemia: Secondary | ICD-10-CM

## 2022-12-09 LAB — SURGICAL PATHOLOGY

## 2022-12-09 NOTE — Anesthesia Postprocedure Evaluation (Signed)
Anesthesia Post Note  Patient: Daniel Scott  Procedure(s) Performed: COLONOSCOPY WITH PROPOFOL POLYPECTOMY  Patient location during evaluation: Phase II Anesthesia Type: General Level of consciousness: awake Pain management: pain level controlled Vital Signs Assessment: post-procedure vital signs reviewed and stable Respiratory status: spontaneous breathing and respiratory function stable Cardiovascular status: blood pressure returned to baseline and stable Postop Assessment: no headache and no apparent nausea or vomiting Anesthetic complications: no Comments: Late entry   No notable events documented.   Last Vitals:  Vitals:   12/08/22 1022 12/08/22 1025  BP:  114/64  Pulse: 70   Resp: 12   Temp: 36.4 C   SpO2: 96%     Last Pain:  Vitals:   12/08/22 1022  TempSrc: Oral  PainSc:                  Windell Norfolk

## 2022-12-13 ENCOUNTER — Other Ambulatory Visit (INDEPENDENT_AMBULATORY_CARE_PROVIDER_SITE_OTHER): Payer: Self-pay | Admitting: Internal Medicine

## 2022-12-13 ENCOUNTER — Encounter (INDEPENDENT_AMBULATORY_CARE_PROVIDER_SITE_OTHER): Payer: Self-pay | Admitting: Internal Medicine

## 2022-12-13 ENCOUNTER — Ambulatory Visit (INDEPENDENT_AMBULATORY_CARE_PROVIDER_SITE_OTHER): Payer: 59 | Admitting: Internal Medicine

## 2022-12-13 ENCOUNTER — Encounter (INDEPENDENT_AMBULATORY_CARE_PROVIDER_SITE_OTHER): Payer: Self-pay

## 2022-12-13 VITALS — BP 118/82 | HR 72 | Temp 98.3°F | Ht 70.0 in | Wt 328.0 lb

## 2022-12-13 DIAGNOSIS — R748 Abnormal levels of other serum enzymes: Secondary | ICD-10-CM | POA: Insufficient documentation

## 2022-12-13 DIAGNOSIS — Z7985 Long-term (current) use of injectable non-insulin antidiabetic drugs: Secondary | ICD-10-CM

## 2022-12-13 DIAGNOSIS — E1165 Type 2 diabetes mellitus with hyperglycemia: Secondary | ICD-10-CM

## 2022-12-13 DIAGNOSIS — Z7984 Long term (current) use of oral hypoglycemic drugs: Secondary | ICD-10-CM

## 2022-12-13 DIAGNOSIS — E66813 Obesity, class 3: Secondary | ICD-10-CM

## 2022-12-13 DIAGNOSIS — Z6841 Body Mass Index (BMI) 40.0 and over, adult: Secondary | ICD-10-CM

## 2022-12-13 MED ORDER — TIRZEPATIDE 2.5 MG/0.5ML ~~LOC~~ SOAJ
2.5000 mg | SUBCUTANEOUS | 0 refills | Status: DC
Start: 2022-12-13 — End: 2022-12-29

## 2022-12-13 NOTE — Progress Notes (Unsigned)
Office: [phone]  /  Fax: [phone]  WEIGHT SUMMARY AND BIOMETRICS  Vitals Temp: 98.3 F (36.8 C) BP: 118/82 Pulse Rate: 72 SpO2: 97 %   Anthropometric Measurements Height: 5\' 10"  (1.778 m) Weight: (!) 328 lb (148.8 kg) BMI (Calculated): 47.06 Weight at Last Visit: 327 lb Weight Lost Since Last Visit: 0 lb Weight Gained Since Last Visit: 1 lb Starting Weight: 327 lb Total Weight Loss (lbs): 0 lb (0 kg) Peak Weight: 383 lb   Body Composition  Body Fat %: 45 % Fat Mass (lbs): 147.8 lbs Muscle Mass (lbs): 172 lbs Total Body Water (lbs): 134.4 lbs Visceral Fat Rating : 33    HPI  Chief Complaint: OBESITY  Daniel Scott is here to discuss his progress with his obesity treatment plan. He is on the 1800 calories and 100130 grams of protein daily and states he is following his eating plan approximately 50 % of the time. He states he is walking 5000+ steps 3 times per week.  Interval History:  Since last office visit he has gained 1 pound. He reports has not implemented reduced calorie nutritional plan due to recently completing colonoscopy and hard time starting to transition.  He might have been in the contemplative stage when he came here. He has been working on not skipping meals and working on gradual implementation of reduced calorie nutrition plan Denies problems with appetite and hunger signals.  Reports problems with satiety and satiation.  Reports problems with eating patterns and portion control.  Denies abnormal cravings. Denies feeling deprived or restricted.   Barriers identified: having difficulty preparing healthy meals, having difficulty with meal prep and planning, inability to focus on healthy eating, and difficulty implementing reduced calorie nutrition plan.   Pharmacotherapy for weight loss: He is currently taking no anti-obesity medication.    ASSESSMENT AND PLAN  TREATMENT PLAN FOR OBESITY:  Recommended Dietary Goals  Daniel Scott is currently in  the action stage of change. As such, his goal is to continue weight management plan. He has agreed to: continue current plan  Behavioral Intervention  We discussed the following Behavioral Modification Strategies today: increasing lean protein intake, decreasing simple carbohydrates , increasing vegetables, increasing lower glycemic fruits, avoiding skipping meals, increasing water intake, work on meal planning and preparation, work on tracking and journaling calories using tracking application, and reading food labels .  Additional resources provided today: Handout on tracking and journaling using Apps  Recommended Physical Activity Goals  Daniel Scott has been advised to work up to 150 minutes of moderate intensity aerobic activity a week and strengthening exercises 2-3 times per week for cardiovascular health, weight loss maintenance and preservation of muscle mass.   He has agreed to :  Think about ways to increase physical activity  Pharmacotherapy We discussed various medication options to help Daniel Scott with his weight loss efforts and we both agreed to :  I feel patient may benefit from switching from Trulicity to [facility] if covered by his insurance.  ASSOCIATED CONDITIONS ADDRESSED TODAY  There are no diagnoses linked to this encounter.   PHYSICAL EXAM:  Blood pressure 118/82, pulse 72, temperature 98.3 F (36.8 C), height 5\' 10"  (1.778 m), weight (!) 328 lb (148.8 kg), SpO2 97 %. Body mass index is 47.06 kg/m.  General: He is overweight, cooperative, alert, well developed, and in no acute distress. PSYCH: Has normal mood, affect and thought process.   HEENT: EOMI, sclerae are anicteric. Lungs: Normal breathing effort, no conversational dyspnea. Extremities: No edema.  Neurologic:  No gross sensory or motor deficits. No tremors or fasciculations noted.    DIAGNOSTIC DATA REVIEWED:  BMET    Component Value Date/Time   NA 141 11/30/2022 0904   K 5.0 11/30/2022 0904   CL 99  11/30/2022 0904   CO2 23 11/30/2022 0904   GLUCOSE 112 (H) 11/30/2022 0904   GLUCOSE 119 (H) 04/21/2022 0929   BUN 13 11/30/2022 0904   CREATININE 0.88 11/30/2022 0904   CALCIUM 10.1 11/30/2022 0904   GFRNONAA >60 03/23/2022 1113   GFRAA 108 09/25/2020 1034   Lab Results  Component Value Date   HGBA1C 6.9 (H) 11/30/2022   HGBA1C 7.0 (H) 12/09/2019   Lab Results  Component Value Date   INSULIN 14.8 11/30/2022   Lab Results  Component Value Date   TSH 1.430 06/06/2022   CBC    Component Value Date/Time   WBC 4.9 06/06/2022 0805   WBC 5.7 03/23/2022 1113   RBC 4.62 06/06/2022 0805   RBC 4.56 03/23/2022 1113   HGB 14.6 06/06/2022 0805   HCT 43.0 06/06/2022 0805   PLT 260 06/06/2022 0805   MCV 93 06/06/2022 0805   MCH 31.6 06/06/2022 0805   MCH 31.6 03/23/2022 1113   MCHC 34.0 06/06/2022 0805   MCHC 33.4 03/23/2022 1113   RDW 13.3 06/06/2022 0805   Iron Studies No results found for: "IRON", "TIBC", "FERRITIN", "IRONPCTSAT" Lipid Panel     Component Value Date/Time   CHOL 130 11/30/2022 0904   TRIG 123 11/30/2022 0904   HDL 42 11/30/2022 0904   CHOLHDL 3.4 06/06/2022 0805   LDLCALC 66 11/30/2022 0904   Hepatic Function Panel     Component Value Date/Time   PROT 6.9 11/30/2022 0904   ALBUMIN 4.9 11/30/2022 0904   AST 46 (H) 11/30/2022 0904   ALT 70 (H) 11/30/2022 0904   ALKPHOS 80 11/30/2022 0904   BILITOT 0.4 11/30/2022 0904      Component Value Date/Time   TSH 1.430 06/06/2022 0805   Nutritional Lab Results  Component Value Date   VD25OH 77.6 11/30/2022   VD25OH 63.5 04/14/2022     No follow-ups on file.Marland Kitchen He was informed of the importance of frequent follow up visits to maximize his success with intensive lifestyle modifications for his multiple health conditions.   ATTESTASTION STATEMENTS:  Reviewed by clinician on day of visit: allergies, medications, problem list, medical history, surgical history, family history, social history, and  previous encounter notes.     Worthy Rancher, MD

## 2022-12-13 NOTE — Assessment & Plan Note (Addendum)
Patient drinks about 6 beers a week, he reports drinking more in the past.  He does not meet criteria for alcohol use disorder.  He also reports having a history of elevated ferritin levels for which she had a liver biopsy but does not know the results.  This was done about 14 years ago.  He denies family history of liver disease or risk factors for viral hepatitis.  We are checking hepatitis serologies, iron studies and a liver ultrasound.  Losing 15% of body weight may improve condition also reducing alcohol.  Patient may also have sleep apnea and diabetes so he is high risk for hepatic cirrhosis in the future.  He may benefit from a FibroTest and treatment with semaglutide or tirzepatide.  After discussion of benefits and side effects he is agreeable to starting tirzepatide for his diabetes, obesity as well as possible fatty liver disease.

## 2022-12-14 LAB — HEPATITIS C ANTIBODY: Hep C Virus Ab: NONREACTIVE

## 2022-12-14 LAB — HEPATITIS B CORE ANTIBODY, IGM: Hep B C IgM: NEGATIVE

## 2022-12-14 LAB — GAMMA GT: GGT: 26 IU/L (ref 0–65)

## 2022-12-14 LAB — IRON,TIBC AND FERRITIN PANEL
Ferritin: 66 ng/mL (ref 30–400)
Iron Saturation: 24 % (ref 15–55)
Iron: 96 ug/dL (ref 38–169)
Total Iron Binding Capacity: 406 ug/dL (ref 250–450)
UIBC: 310 ug/dL (ref 111–343)

## 2022-12-14 LAB — HEPATITIS B SURFACE ANTIBODY,QUALITATIVE: Hep B Surface Ab, Qual: NONREACTIVE

## 2022-12-14 LAB — HEPATITIS B SURFACE ANTIGEN: Hepatitis B Surface Ag: NEGATIVE

## 2022-12-14 NOTE — Assessment & Plan Note (Signed)
HgbA1c is at goal for age and comorbid conditions. Denies symptoms of hypoglycemia or hyperglycemia. On Trulicity, Jardiance, metformin with good adherence and no side effects.   Counseled on goals of care, monitoring for complications and importance of staying updated on immunizations and diabetes preventive measures. Continue with reduced calorie meal plan low on processed crabs and simple sugars. Ongoing weight loss will improve insulin resistance and glycemic control  Lab Results  Component Value Date   HGBA1C 6.9 (H) 11/30/2022   HGBA1C 7.1 (H) 09/08/2022   HGBA1C 7.2 (H) 06/06/2022   Lab Results  Component Value Date   LDLCALC 66 11/30/2022   CREATININE 0.88 11/30/2022   Since patient is affected by obesity, has diabetes and may also have hepatic steatosis he will benefit from treatment with either semaglutide or tirzepatide.  He also has coronary artery calcifications so this will help also reduce his cardiovascular risk.  I recommend switching from Trulicity to tirzepatide if coverage is not an issue.  After discussion of benefits and side effects we will be started on tirzepatide 2.5 mg once a week.  He will discontinue the use of Trulicity he was made aware not to use both medications.

## 2022-12-15 ENCOUNTER — Encounter (HOSPITAL_COMMUNITY): Payer: Self-pay | Admitting: Internal Medicine

## 2022-12-15 ENCOUNTER — Encounter (INDEPENDENT_AMBULATORY_CARE_PROVIDER_SITE_OTHER): Payer: Self-pay | Admitting: Internal Medicine

## 2022-12-15 ENCOUNTER — Ambulatory Visit: Payer: 59 | Admitting: Family Medicine

## 2022-12-29 ENCOUNTER — Other Ambulatory Visit: Payer: Self-pay | Admitting: Family Medicine

## 2022-12-29 NOTE — Telephone Encounter (Signed)
Last office visit 09/08/22 Upcoming appointment 12/30/22 Med listed as historical and requires provider approval

## 2022-12-30 ENCOUNTER — Ambulatory Visit: Payer: 59 | Admitting: Family Medicine

## 2022-12-30 ENCOUNTER — Encounter: Payer: Self-pay | Admitting: Family Medicine

## 2022-12-30 VITALS — BP 112/75 | HR 61 | Temp 97.7°F | Ht 70.0 in | Wt 330.2 lb

## 2022-12-30 DIAGNOSIS — E1159 Type 2 diabetes mellitus with other circulatory complications: Secondary | ICD-10-CM | POA: Diagnosis not present

## 2022-12-30 DIAGNOSIS — N529 Male erectile dysfunction, unspecified: Secondary | ICD-10-CM

## 2022-12-30 DIAGNOSIS — M5416 Radiculopathy, lumbar region: Secondary | ICD-10-CM | POA: Diagnosis not present

## 2022-12-30 DIAGNOSIS — N401 Enlarged prostate with lower urinary tract symptoms: Secondary | ICD-10-CM | POA: Insufficient documentation

## 2022-12-30 DIAGNOSIS — E1165 Type 2 diabetes mellitus with hyperglycemia: Secondary | ICD-10-CM

## 2022-12-30 DIAGNOSIS — I152 Hypertension secondary to endocrine disorders: Secondary | ICD-10-CM

## 2022-12-30 DIAGNOSIS — E1169 Type 2 diabetes mellitus with other specified complication: Secondary | ICD-10-CM

## 2022-12-30 DIAGNOSIS — B372 Candidiasis of skin and nail: Secondary | ICD-10-CM

## 2022-12-30 DIAGNOSIS — Z6841 Body Mass Index (BMI) 40.0 and over, adult: Secondary | ICD-10-CM

## 2022-12-30 DIAGNOSIS — I700 Atherosclerosis of aorta: Secondary | ICD-10-CM | POA: Diagnosis not present

## 2022-12-30 DIAGNOSIS — Z87891 Personal history of nicotine dependence: Secondary | ICD-10-CM | POA: Diagnosis not present

## 2022-12-30 DIAGNOSIS — E785 Hyperlipidemia, unspecified: Secondary | ICD-10-CM

## 2022-12-30 DIAGNOSIS — E039 Hypothyroidism, unspecified: Secondary | ICD-10-CM

## 2022-12-30 DIAGNOSIS — N400 Enlarged prostate without lower urinary tract symptoms: Secondary | ICD-10-CM | POA: Diagnosis not present

## 2022-12-30 MED ORDER — CLOTRIMAZOLE-BETAMETHASONE 1-0.05 % EX CREA
1.0000 | TOPICAL_CREAM | Freq: Every day | CUTANEOUS | 0 refills | Status: AC
Start: 2022-12-30 — End: ?

## 2022-12-30 NOTE — Progress Notes (Signed)
Established Patient Office Visit  Subjective   Patient ID: Daniel Scott, male    DOB: 09/24/1960  Age: 61 y.o. MRN: [id number]  Chief Complaint  Patient presents with   Medical Management of Chronic Issues   Diabetes    HPI T2DM Pt presents for evaluation of Type 2 diabetes mellitus. Patient denies foot ulcerations, increased appetite, nausea, polydipsia, polyuria, visual disturbances, vomiting, and weight loss.  Current diabetic medications include metformin, trulicity, jardiance Compliant with meds - Yes  Current monitoring regimen:  rarely checks  Eye exam current (within one year): yes, will request records Current diet: regular, working on portion control Current exercise:  limited due to chronic hip pain  Urine microalbumin UTD? Yes Is He on ACE inhibitor or angiotensin II receptor blocker?  Yes, lisinopril Is He on statin? Yes atorvastatin Is He on ASA 81 mg daily?  Yes  2. HTN Complaint with meds - Yes Current Medications - amlodipine 10 mg, lisinopril 20 mg, spironlactone 25 mg, metoprolol Checking BP at home - rarely Pertinent ROS:  Headache - No Fatigue - No Visual Disturbances - No Chest pain - No Dyspnea - No Palpitations - No LE edema - No  3. HLD On atorvastatin 80 mg.   4. GERD On omeprazole. Reports well controlled. Denies nausea, vomiting, dysphagia.   5. Hypothyroid On levothyroxine 75 mcg. Reports compliance. No symptoms.   6. Chronic back pain Increased pain over the last few months. The pain is in his lower back pain, sometimes radiates down hips, groin, thigh. He also has noticed that he has difficulty lifting his legs at times. He has almost tripped a few times because his foot isn't high enough. Denies changes in bowel or bladder control. Denies numbness or tingling, saddle anesthesia. MRI in 2023 showed lumbar DDD with nerve impingement. Has appt with ortho coming up for follow up.   7. Enlarged prostate Noted on prior imaging.  Denies urinary symptoms. He does have ED and would like to have his testosterone level checked today.   8. Smoking Former heavy smoker. Quit 2010. He smoked for 33 years 2 PPD. He is interested In lung cancer screening.    Past Medical History:  Diagnosis Date   Anxiety    Arthritis    hands and feet   Back pain    DDD (degenerative disc disease), cervical    Depression    Diabetes mellitus without complication (HCC)    type 2   Edema of both lower extremities    Fatty liver    GERD (gastroesophageal reflux disease)    Hiatal hernia    Hyperlipidemia    Hypertension    Hypothyroidism    Joint pain    Morbid obesity (HCC)    Neuromuscular disorder (HCC)    stenosis   Palpitations    Pneumonia 1990   PONV (postoperative nausea and vomiting)    Right hip pain    Stenosis of cervical spine    Thyroid disease       ROS As per HPI.    Objective:     BP 112/75   Pulse 61   Temp 97.7 F (36.5 C) (Temporal)   Ht 5\' 10"  (1.778 m)   Wt (!) 330 lb 4 oz (149.8 kg)   SpO2 96%   BMI 47.39 kg/m  Wt Readings from Last 3 Encounters:  12/30/22 (!) 330 lb 4 oz (149.8 kg)  12/13/22 (!) 328 lb (148.8 kg)  12/08/22 (!) 326 lb 15.1  oz (148.3 kg)     Physical Exam Vitals and nursing note reviewed.  Constitutional:      General: He is not in acute distress.    Appearance: He is obese. He is not ill-appearing, toxic-appearing or diaphoretic.  HENT:     Head: Normocephalic and atraumatic.     Nose: Nose normal.     Mouth/Throat:     Mouth: Mucous membranes are moist.     Pharynx: Oropharynx is clear.  Cardiovascular:     Rate and Rhythm: Normal rate and regular rhythm.     Heart sounds: Normal heart sounds. No murmur heard. Pulmonary:     Effort: Pulmonary effort is normal. No respiratory distress.     Breath sounds: Normal breath sounds.  Abdominal:     General: Bowel sounds are normal.     Palpations: Abdomen is soft.  Musculoskeletal:     Right lower leg: No  edema.     Left lower leg: No edema.  Skin:    General: Skin is warm and dry.  Neurological:     General: No focal deficit present.     Mental Status: He is alert and oriented to person, place, and time.  Psychiatric:        Mood and Affect: Mood normal.        Behavior: Behavior normal.        Thought Content: Thought content normal.        Judgment: Judgment normal.     No results found for any visits on 12/30/22.    The 10-year ASCVD risk score (Arnett DK, et al., 2019) is: 15.1%    Assessment & Plan:   Adien was seen today for medical management of chronic issues and diabetes.  Diagnoses and all orders for this visit:  Type 2 diabetes mellitus with hyperglycemia, without long-term current use of insulin (HCC) A1c 6.9 on 11/2722, at goal of <7. Medication changes today: none. He is on an ACE/ARB and statin. Eye exam: awaiting records. Foot exam: UTD. Urine micro: UTD. Diet and exercise.   Hypertension associated with type 2 diabetes mellitus (HCC) Well controlled on current regimen.   Hyperlipidemia associated with type 2 diabetes mellitus (HCC) Recent LDL 66. On statin. Diet and exercise.   Aortic atherosclerosis (HCC) On statin and aspirin.   Morbid obesity Managed by healthy weight and wellness.   Acquired hypothyroidism Compliant with synthroid. Asymptomatic. TSH pending.  -     TSH  Lumbar nerve root impingement Follow up with ortho as scheduled for worsening symptoms. No red flags.   Enlarged prostate On imaging. Labs pending. -     PSA, total and free  Erectile dysfunction, unspecified erectile dysfunction type -     Testosterone,Free and Total  Yeast dermatitis Refill for recurrent dermatitis.  -     clotrimazole-betamethasone (LOTRISONE) cream; Apply 1 Application topically daily.  Former heavy tobacco smoker -     Ambulatory Referral Lung Cancer Screening [facility]   Return in about 3 months (around 03/31/2023) for chronic follow up.    The patient indicates understanding of these issues and agrees with the plan.  Gabriel Earing, FNP

## 2022-12-31 LAB — PSA, TOTAL AND FREE: Prostate Specific Ag, Serum: 2.6 ng/mL (ref 0.0–4.0)

## 2023-01-02 ENCOUNTER — Other Ambulatory Visit: Payer: Self-pay | Admitting: Family Medicine

## 2023-01-02 DIAGNOSIS — E1165 Type 2 diabetes mellitus with hyperglycemia: Secondary | ICD-10-CM

## 2023-01-03 ENCOUNTER — Encounter: Payer: Self-pay | Admitting: Family Medicine

## 2023-01-03 ENCOUNTER — Telehealth: Payer: Self-pay | Admitting: Family Medicine

## 2023-01-03 ENCOUNTER — Other Ambulatory Visit: Payer: Self-pay | Admitting: Family Medicine

## 2023-01-03 DIAGNOSIS — K219 Gastro-esophageal reflux disease without esophagitis: Secondary | ICD-10-CM

## 2023-01-03 DIAGNOSIS — E1165 Type 2 diabetes mellitus with hyperglycemia: Secondary | ICD-10-CM

## 2023-01-03 MED ORDER — METFORMIN HCL ER 500 MG PO TB24
1000.0000 mg | ORAL_TABLET | Freq: Two times a day (BID) | ORAL | 3 refills | Status: DC
Start: 2023-01-03 — End: 2023-12-06

## 2023-01-03 NOTE — Telephone Encounter (Signed)
  Name from pharmacy: OMEPRAZOLE DR 20 MG CAPSULE    Pharmacy comment: Alternative Requested:PA REQUIRES FOR QTY MORE THAN 90 CAPS IN 365 DAYS.

## 2023-01-05 ENCOUNTER — Encounter: Payer: Self-pay | Admitting: Family Medicine

## 2023-01-05 LAB — PSA, TOTAL AND FREE
PSA, Free Pct: 28.8 %
PSA, Free: 0.75 ng/mL

## 2023-01-05 LAB — TESTOSTERONE,FREE AND TOTAL
Testosterone, Free: 5.1 pg/mL — ABNORMAL LOW (ref 6.6–18.1)
Testosterone: 374 ng/dL (ref 264–916)

## 2023-01-05 LAB — TSH: TSH: 1.27 u[IU]/mL (ref 0.450–4.500)

## 2023-01-09 ENCOUNTER — Ambulatory Visit: Payer: 59 | Admitting: Orthopaedic Surgery

## 2023-01-09 ENCOUNTER — Encounter: Payer: Self-pay | Admitting: Orthopaedic Surgery

## 2023-01-09 VITALS — Ht 70.0 in | Wt 326.8 lb

## 2023-01-09 DIAGNOSIS — M1611 Unilateral primary osteoarthritis, right hip: Secondary | ICD-10-CM | POA: Diagnosis not present

## 2023-01-09 DIAGNOSIS — M25551 Pain in right hip: Secondary | ICD-10-CM

## 2023-01-09 DIAGNOSIS — M87051 Idiopathic aseptic necrosis of right femur: Secondary | ICD-10-CM

## 2023-01-09 NOTE — Progress Notes (Signed)
The patient comes in today for continued follow-up as it relates to his right hip.  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  We wanted to weigh him today as well as get a new BMI given his morbid obesity.  His BMI at his last visit was 48.  Down to 46.89.  He says the hip feels just a little bit better.  He is walking still with a slight limp and Trendelenburg gait.  He does hurt only in the groin.  His right hip moves smoothly and fluidly and has minimal discomfort.  Again he reports that he is feeling better.  I did look at his MRI again from back in December that showed a small nidus of AVN.  He understands this could certainly get worse.  If it does get worse he needs to let us know immediately.  If it does not get worse, I would like to see him back in 3 months for repeat weight and BMI calculation.  At that visit I would like to to have a new AP pelvis and lateral of his right hip but I will do in supine with his abdomen pulled out of the way so we can get a good look at the hip joint.

## 2023-01-10 ENCOUNTER — Other Ambulatory Visit (HOSPITAL_COMMUNITY): Payer: Self-pay

## 2023-01-10 ENCOUNTER — Telehealth: Payer: Self-pay

## 2023-01-10 ENCOUNTER — Ambulatory Visit (HOSPITAL_COMMUNITY)
Admission: RE | Admit: 2023-01-10 | Discharge: 2023-01-10 | Disposition: A | Discharge status: Home / Self Care | Payer: 59 | Source: Ambulatory Visit | Attending: Internal Medicine | Admitting: Internal Medicine

## 2023-01-10 DIAGNOSIS — R748 Abnormal levels of other serum enzymes: Secondary | ICD-10-CM | POA: Insufficient documentation

## 2023-01-10 NOTE — Telephone Encounter (Signed)
PA approved.

## 2023-01-10 NOTE — Telephone Encounter (Signed)
PA request for quantity limits exceeded for Omeprazole 20MG  dr capsules  PA submitted to [facility] and has been approved.   Key: [id number]  Test claim for 30 days shows co-pay of $1.63

## 2023-01-12 ENCOUNTER — Ambulatory Visit: Payer: 59 | Attending: Cardiology | Admitting: Cardiology

## 2023-01-12 ENCOUNTER — Encounter: Payer: Self-pay | Admitting: Cardiology

## 2023-01-12 ENCOUNTER — Encounter: Payer: Self-pay | Admitting: Family Medicine

## 2023-01-12 VITALS — BP 128/68 | HR 83 | Ht 70.0 in | Wt 328.8 lb

## 2023-01-12 DIAGNOSIS — R400 Somnolence: Secondary | ICD-10-CM

## 2023-01-12 DIAGNOSIS — I700 Atherosclerosis of aorta: Secondary | ICD-10-CM

## 2023-01-12 DIAGNOSIS — E1122 Type 2 diabetes mellitus with diabetic chronic kidney disease: Secondary | ICD-10-CM | POA: Diagnosis not present

## 2023-01-12 DIAGNOSIS — E785 Hyperlipidemia, unspecified: Secondary | ICD-10-CM | POA: Diagnosis not present

## 2023-01-12 DIAGNOSIS — E1159 Type 2 diabetes mellitus with other circulatory complications: Secondary | ICD-10-CM | POA: Diagnosis not present

## 2023-01-12 DIAGNOSIS — N401 Enlarged prostate with lower urinary tract symptoms: Secondary | ICD-10-CM

## 2023-01-12 DIAGNOSIS — I129 Hypertensive chronic kidney disease with stage 1 through stage 4 chronic kidney disease, or unspecified chronic kidney disease: Secondary | ICD-10-CM

## 2023-01-12 DIAGNOSIS — I152 Hypertension secondary to endocrine disorders: Secondary | ICD-10-CM

## 2023-01-12 DIAGNOSIS — Z7984 Long term (current) use of oral hypoglycemic drugs: Secondary | ICD-10-CM

## 2023-01-12 DIAGNOSIS — I4729 Other ventricular tachycardia: Secondary | ICD-10-CM

## 2023-01-12 DIAGNOSIS — R0683 Snoring: Secondary | ICD-10-CM

## 2023-01-12 DIAGNOSIS — E1169 Type 2 diabetes mellitus with other specified complication: Secondary | ICD-10-CM

## 2023-01-12 NOTE — Addendum Note (Signed)
Addended by: Hessie Diener on: 01/12/2023 04:04 PM   Modules accepted: Orders

## 2023-01-12 NOTE — Patient Instructions (Signed)
Medication Instructions:  Your physician recommends that you continue on your current medications as directed. Please refer to the Current Medication list given to you today.  *If you need a refill on your cardiac medications before your next appointment, please call your pharmacy*   Lab Work: None   Testing/Procedures: Your physician has recommended that you have a sleep study. This test records several body functions during sleep, including: brain activity, eye movement, oxygen and carbon dioxide blood levels, heart rate and rhythm, breathing rate and rhythm, the flow of air through your mouth and nose, snoring, body muscle movements, and chest and belly movement.    Follow-Up: At [facility], you and your health needs are our priority.  As part of our continuing mission to provide you with exceptional heart care, we have created designated Provider Care Teams.  These Care Teams include your primary Cardiologist (physician) and Advanced Practice Providers (APPs -  Physician Assistants and Nurse Practitioners) who all work together to provide you with the care you need, when you need it.    Your next appointment:   1 year(s)  Provider:   Kardie Tobb, DO  

## 2023-01-12 NOTE — Progress Notes (Signed)
Cardiology Office Note:    Date:  01/13/2023   ID:  [name], DOB [birth year], MRN [id number]  PCP:  Gabriel Earing, FNP  Cardiologist:  Thomasene Ripple, DO  Electrophysiologist:  None   Referring MD: Gabriel Earing, FNP   " I am experiencing chest pain"  History of Present Illness:    Daniel Scott is a 62 y.o. male with a hx of hypertension, hyperlipidemia, diabetes mellitus hemoglobin A1c 7.1 which was done on March 02, 2022, minimal coronary artery disease on heart catheterization which was done back in 2006, recent heart catheterization did not show any evidence of coronary artery disease, recent ZIO monitor showed evidence of symptomatic NSVT, frequent PVCs as well as paroxysmal SVT morbid obesity and depression here today for follow-up visit.  At his last visit he also reported that he was experiencing significant PVCs.    I saw the patient June 27, 2022 at that time he was supposed heart catheterization which was normal.  He was pending surgery.  But his surgery has been pushed back due to approval process.  He has transition to orthopedic offices.  He was asked to lose some weight prior.  He has been seeing healthy wellness.  No concerns from a cardiovascular standpoint.  Past Medical History:  Diagnosis Date   Anxiety    Arthritis    hands and feet   Back pain    DDD (degenerative disc disease), cervical    Depression    Diabetes mellitus without complication (HCC)    type 2   Edema of both lower extremities    Fatty liver    GERD (gastroesophageal reflux disease)    Hiatal hernia    Hyperlipidemia    Hypertension    Hypothyroidism    Joint pain    Morbid obesity (HCC)    Neuromuscular disorder (HCC)    stenosis   Palpitations    Pneumonia 1990   PONV (postoperative nausea and vomiting)    Right hip pain    Stenosis of cervical spine    Thyroid disease     Past Surgical History:  Procedure Laterality Date   ANTERIOR CERVICAL DECOMP/DISCECTOMY  FUSION N/A 06/02/2020   Procedure: ANTERIOR CERVICAL DECOMPRESSION/DISCECTOMY FUSIONCERVICAL FOUR- CERVICAL FIVE, CERVICAL FIVE- CERVICAL SIX;  Surgeon: Dawley, Alan Mulder, DO;  Location: [facility] OR;  Service: Neurosurgery;  Laterality: N/A;  ANTERIOR CERVICAL DECOMPRESSION/DISCECTOMY FUSIONCERVICAL FOUR- CERVICAL FIVE, CERVICAL FIVE- CERVICAL SIX   CARDIAC CATHETERIZATION  1996; 2006   [facility]   COLONOSCOPY WITH PROPOFOL N/A 12/08/2022   Procedure: COLONOSCOPY WITH PROPOFOL;  Surgeon: Lanelle Bal, DO;  Location: [facility];  Service: Endoscopy;  Laterality: N/A;  10:00 am , asa 3   HYDROCELE EXCISION     LAPAROSCOPIC GASTRIC BANDING     LAPAROSCOPIC ROUX-EN-Y GASTRIC BYPASS WITH UPPER ENDOSCOPY AND REMOVAL OF LAP BAND  02/2012   LEFT HEART CATH AND CORONARY ANGIOGRAPHY N/A 04/21/2022   Procedure: LEFT HEART CATH AND CORONARY ANGIOGRAPHY;  Surgeon: Lyn Records, MD;  Location: [facility] INVASIVE CV LAB;  Service: Cardiovascular;  Laterality: N/A;   ligament replacement Right    right hand   POLYPECTOMY  12/08/2022   Procedure: POLYPECTOMY;  Surgeon: Lanelle Bal, DO;  Location: [facility];  Service: Endoscopy;;   TONSILLECTOMY      Current Medications: Current Meds  Medication Sig   amLODipine (NORVASC) 10 MG tablet Take 1 tablet (10 mg total) by mouth daily.  APPLE CIDER VINEGAR PO Take 450 mg by mouth 2 (two) times daily.   aspirin 325 MG EC tablet Take 325 mg by mouth daily.   atorvastatin (LIPITOR) 80 MG tablet Take 1 tablet (80 mg total) by mouth daily.   Cholecalciferol (DIALYVITE VITAMIN D 5000) 125 MCG (5000 UT) capsule Take 5,000 Units by mouth daily.   CINNAMON PO Take 1,000 mg by mouth 2 (two) times daily.   clotrimazole-betamethasone (LOTRISONE) cream Apply 1 Application topically daily.   Dulaglutide (TRULICITY) 4.5 MG/0.5ML SOPN INJECT 4.5 MG AS DIRECTED ONCE A WEEK.   empagliflozin (JARDIANCE) 25 MG TABS tablet TAKE 1 TABLET BY MOUTH EVERY DAY BEFORE  BREAKFAST   Garlic 1000 MG CAPS Take 1,000 mg by mouth 2 (two) times daily.   ibuprofen (ADVIL) 200 MG tablet Take 400 mg by mouth every 6 (six) hours as needed for moderate pain.   Krill Oil 500 MG CAPS Take 500 mg by mouth daily.   levothyroxine (SYNTHROID) 75 MCG tablet TAKE 1 TABLET BY MOUTH EVERY DAY BEFORE BREAKFAST   lisinopril (ZESTRIL) 20 MG tablet Take 1 tablet (20 mg total) by mouth daily.   metFORMIN (GLUCOPHAGE-XR) 500 MG 24 hr tablet Take 2 tablets (1,000 mg total) by mouth 2 (two) times daily with a meal.   metoprolol succinate (TOPROL-XL) 25 MG 24 hr tablet Take 0.5 tablets (12.5 mg total) by mouth daily. Take with or immediately following a meal.   Multiple Vitamin (MULTIVITAMIN ADULT PO) Take 1 tablet by mouth daily.    nitroGLYCERIN (NITROSTAT) 0.4 MG SL tablet Place 0.4 mg under the tongue every 5 (five) minutes as needed for chest pain.   olopatadine (PATANOL) 0.1 % ophthalmic solution Place 1 drop into both eyes daily as needed for allergies.   omeprazole (PRILOSEC) 20 MG capsule TAKE 1 CAPSULE BY MOUTH EVERY DAY   psyllium (HYDROCIL/METAMUCIL) 95 % PACK Take 1 packet by mouth daily.   spironolactone (ALDACTONE) 25 MG tablet TAKE 1 TABLET (25 MG TOTAL) BY MOUTH DAILY.   Turmeric (QC TUMERIC COMPLEX PO) Take 1 capsule by mouth in the morning and at bedtime.     Allergies:   Sulfa antibiotics   Social History   Socioeconomic History   Marital status: Married    Spouse name: Suhaan Jaques   Number of children: Not on file   Years of education: Not on file   Highest education level: Associate degree: occupational, Scientist, product/process development, or vocational program  Occupational History   Occupation: Retired  Tobacco Use   Smoking status: Former    Types: Cigarettes    Quit date: 05/2009    Years since quitting: 13.6   Smokeless tobacco: Never  Vaping Use   Vaping Use: Never used  Substance and Sexual Activity   Alcohol use: Yes    Comment: Couple times a week   Drug use: Yes     Types: Marijuana    Comment: occ   Sexual activity: Yes    Birth control/protection: None  Other Topics Concern   Not on file  Social History Narrative   Not on file   Social Determinants of Health   Financial Resource Strain: Low Risk  (12/26/2022)   Overall Financial Resource Strain (CARDIA)    Difficulty of Paying Living Expenses: Not very hard  Food Insecurity: No Food Insecurity (12/26/2022)   Hunger Vital Sign    Worried About Running Out of Food in the Last Year: Never true    Ran Out of Food in the  Last Year: Never true  Transportation Needs: No Transportation Needs (12/26/2022)   PRAPARE - Administrator, Civil Service (Medical): No    Lack of Transportation (Non-Medical): No  Physical Activity: Unknown (12/26/2022)   Exercise Vital Sign    Days of Exercise per Week: 0 days    Minutes of Exercise per Session: Not on file  Stress: No Stress Concern Present (12/26/2022)   [employer] of Occupational Health - Occupational Stress Questionnaire    Feeling of Stress : Only a little  Social Connections: Socially Isolated (12/26/2022)   Social Connection and Isolation Panel [NHANES]    Frequency of Communication with Friends and Family: Once a week    Frequency of Social Gatherings with Friends and Family: Once a week    Attends Religious Services: Never    Database administrator or Organizations: No    Attends Engineer, structural: Not on file    Marital Status: Married     Family History: The patient's family history includes Anxiety disorder in his son; Autism in his son and son; Bipolar disorder in his son; Breast cancer in his maternal grandmother; Cancer in his mother; Colon cancer in his father; Coronary artery disease in his father; Depression in his mother and son; Diabetes in his maternal grandfather and mother; Heart disease in his father, maternal grandfather, and paternal grandfather; Hyperlipidemia in his father; Hypertension in his  father, maternal grandfather, maternal grandmother, mother, paternal grandfather, paternal grandmother, and son; Kidney disease in his mother; Lung cancer in his maternal grandfather; Spina bifida in his daughter; Stroke in his maternal grandfather and maternal grandmother; Sudden death in his father; Thyroid disease in his mother; Vascular Disease in his mother.  ROS:   Review of Systems  Constitution: Reports fatigue, snoring and daytime somnolence.  Negative for decreased appetite, fever and weight gain.  HENT: Negative for congestion, ear discharge, hoarse voice and sore throat.   Eyes: Negative for discharge, redness, vision loss in right eye and visual halos.  Cardiovascular: Reports chest pain, dyspnea on exertion, and palpitation.  Negative for leg swelling, orthopnea. Respiratory: Negative for cough, hemoptysis, shortness of breath.   Endocrine: Negative for heat intolerance and polyphagia.  Hematologic/Lymphatic: Negative for bleeding problem. Does not bruise/bleed easily.  Skin: Negative for flushing, nail changes, rash and suspicious lesions.  Musculoskeletal: Negative for arthritis, joint pain, muscle cramps, myalgias, neck pain and stiffness.  Gastrointestinal: Negative for abdominal pain, bowel incontinence, diarrhea and excessive appetite.  Genitourinary: Negative for decreased libido, genital sores and incomplete emptying.  Neurological: Negative for brief paralysis, focal weakness, headaches and loss of balance.  Psychiatric/Behavioral: Negative for altered mental status, depression and suicidal ideas.  Allergic/Immunologic: Negative for HIV exposure and persistent infections.    EKGs/Labs/Other Studies Reviewed:    The following studies were reviewed today:   EKG: None today  Left heart catheterization CONCLUSIONS: Right dominant coronary anatomy Normal/widely patent left main, LAD, circumflex, and RCA. Normal systolic function with EF 55% and LVEDP 21 mmHg.   Findings are consistent with chronic diastolic heart failure. Right heart cath not performed because of technical difficulties (access site sheath pulled before the procedure could be done).   RECOMMENDATIONS:   Further work-up per Dr. Servando Salina. This study would clear the patient for orthopedic surgery that may be upcoming.  He does not have critical coronary disease.   ZIO monitor 05/16/2022 Patch Wear Time:  12 days and 8 hours (2023-08-18T09:50:41-0400 to 2023-08-30T18:25:26-0400)   Patient had a  min HR of 48 bpm, max HR of 235 bpm, and avg HR of 77 bpm. Predominant underlying rhythm was Sinus Rhythm. First Degree AV Block was present. 1 run of Ventricular Tachycardia occurred lasting 4 beats with a max rate of 235 bpm (avg 224  bpm). 5 Supraventricular Tachycardia runs occurred, the run with the fastest interval lasting 13 beats with a max rate of 182 bpm (avg 161 bpm); the run with the fastest interval was also the longest. Isolated SVEs were rare (<1.0%), SVE Couplets were  rare (<1.0%), and SVE Triplets were rare (<1.0%). Isolated VEs were frequent (8.3%, 112004), VE Couplets were rare (<1.0%, 54), and VE Triplets were rare (<1.0%, 5). Ventricular Bigeminy and Trigeminy were present.   Symptoms is associated with ventricular ectopy.   Conclusion: This study is remarkable for the following:                       1.  Symptomatic sustained ventricular tachycardia.                       2.  Frequent premature ventricular complex (8.3%, 112004).                       3.  Paroxysmal supraventricular tachycardia.    Recent Labs: 06/06/2022: Hemoglobin 14.6; Platelets 260 11/30/2022: ALT 70; BUN 13; Creatinine, Ser 0.88; Potassium 5.0; Sodium 141 12/30/2022: TSH 1.270  Recent Lipid Panel    Component Value Date/Time   CHOL 130 11/30/2022 0904   TRIG 123 11/30/2022 0904   HDL 42 11/30/2022 0904   CHOLHDL 3.4 06/06/2022 0805   LDLCALC 66 11/30/2022 0904    Physical Exam:    VS:  BP  128/68   Pulse 83   Ht 5\' 10"  (1.778 m)   Wt (!) 328 lb 12.8 oz (149.1 kg)   SpO2 99%   BMI 47.18 kg/m     Wt Readings from Last 3 Encounters:  01/12/23 (!) 328 lb 12.8 oz (149.1 kg)  01/09/23 (!) 326 lb 12.8 oz (148.2 kg)  12/30/22 (!) 330 lb 4 oz (149.8 kg)     GEN: Well nourished, well developed in no acute distress HEENT: Normal NECK: No JVD; No carotid bruits LYMPHATICS: No lymphadenopathy CARDIAC: S1S2 noted,RRR, no murmurs, rubs, gallops RESPIRATORY:  Clear to auscultation without rales, wheezing or rhonchi  ABDOMEN: Soft, non-tender, non-distended, +bowel sounds, no guarding. EXTREMITIES: No edema, No cyanosis, no clubbing MUSCULOSKELETAL:  No deformity  SKIN: Warm and dry NEUROLOGIC:  Alert and oriented x 3, non-focal PSYCHIATRIC:  Normal affect, good insight  ASSESSMENT:    1. Snoring   2. Daytime somnolence   3. Hypertension associated with type 2 diabetes mellitus (HCC)   4. Aortic atherosclerosis (HCC)   5. 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)   6. NSVT (nonsustained ventricular tachycardia) (HCC)   7. Hyperlipidemia associated with type 2 diabetes mellitus (HCC)   8. Morbid obesity (HCC)      PLAN:    He is doing well from a cardiovascular standpoint.  He has not been approved for SGLT inhibitors for weight loss.  With his diagnosis of diabetes he may qualify for treatment.  Blood pressure is acceptable, continue with current antihypertensive regimen.   The patient understands the need to lose weight with diet and exercise. We have discussed specific strategies for this.  He has had relief since starting  his Toprol-XL we will continue this medication.  The patient understands the need to lose weight with diet and exercise. We have discussed specific strategies for this.   The patient is in agreement with the above plan. The patient left the office in stable condition.  The patient will follow up in  6 weeks.   Medication Adjustments/Labs and Tests Ordered: Current medicines are reviewed at length with the patient today.  Concerns regarding medicines are outlined above.  Orders Placed This Encounter  Procedures   Split night study   No orders of the defined types were placed in this encounter.   Patient Instructions  Medication Instructions:  Your physician recommends that you continue on your current medications as directed. Please refer to the Current Medication list given to you today.  *If you need a refill on your cardiac medications before your next appointment, please call your pharmacy*   Lab Work: None   Testing/Procedures: Your physician has recommended that you have a sleep study. This test records several body functions during sleep, including: brain activity, eye movement, oxygen and carbon dioxide blood levels, heart rate and rhythm, breathing rate and rhythm, the flow of air through your mouth and nose, snoring, body muscle movements, and chest and belly movement.    Follow-Up: At [facility], you and your health needs are our priority.  As part of our continuing mission to provide you with exceptional heart care, we have created designated Provider Care Teams.  These Care Teams include your primary Cardiologist (physician) and Advanced Practice Providers (APPs -  Physician Assistants and Nurse Practitioners) who all work together to provide you with the care you need, when you need it.   Your next appointment:   1 year(s)  Provider:   Thomasene Ripple, DO       Adopting a Healthy Lifestyle.  Know what a healthy weight is for you (roughly BMI <25) and aim to maintain this   Aim for 7+ servings of fruits and vegetables daily   65-80+ fluid ounces of water or unsweet tea for healthy kidneys   Limit to max 1 drink of alcohol per day; avoid smoking/tobacco   Limit animal fats in diet for cholesterol and heart health - choose grass fed whenever  available   Avoid highly processed foods, and foods high in saturated/trans fats   Aim for low stress - take time to unwind and care for your mental health   Aim for 150 min of moderate intensity exercise weekly for heart health, and weights twice weekly for bone health   Aim for 7-9 hours of sleep daily   When it comes to diets, agreement about the perfect plan isnt easy to find, even among the experts. Experts at the [facility] of [employer] developed an idea known as the Healthy Eating Plate. Just imagine a plate divided into logical, healthy portions.   The emphasis is on diet quality:   Load up on vegetables and fruits - one-half of your plate: Aim for color and variety, and remember that potatoes dont count.   Go for whole grains - one-quarter of your plate: Whole wheat, barley, wheat berries, quinoa, oats, brown rice, and foods made with them. If you want pasta, go with whole wheat pasta.   Protein power - one-quarter of your plate: Fish, chicken, beans, and nuts are all healthy, versatile protein sources. Limit red meat.   The diet, however, does go beyond the plate, offering a few other suggestions.  Use healthy plant oils, such as olive, canola, soy, corn, sunflower and peanut. Check the labels, and avoid partially hydrogenated oil, which have unhealthy trans fats.   If youre thirsty, drink water. Coffee and tea are good in moderation, but skip sugary drinks and limit milk and dairy products to one or two daily servings.   The type of carbohydrate in the diet is more important than the amount. Some sources of carbohydrates, such as vegetables, fruits, whole grains, and beans-are healthier than others.   Finally, stay active  Signed, Thomasene Ripple, DO  01/13/2023 11:03 PM    [facility]

## 2023-01-15 ENCOUNTER — Ambulatory Visit
Admission: EM | Admit: 2023-01-15 | Discharge: 2023-01-15 | Disposition: A | Discharge status: Home / Self Care | Payer: 59 | Attending: Nurse Practitioner | Admitting: Nurse Practitioner

## 2023-01-15 ENCOUNTER — Encounter: Payer: Self-pay | Admitting: Emergency Medicine

## 2023-01-15 DIAGNOSIS — R300 Dysuria: Secondary | ICD-10-CM | POA: Diagnosis not present

## 2023-01-15 DIAGNOSIS — R39198 Other difficulties with micturition: Secondary | ICD-10-CM | POA: Diagnosis not present

## 2023-01-15 LAB — POCT URINALYSIS DIP (MANUAL ENTRY)
Bilirubin, UA: NEGATIVE
Glucose, UA: >=1000 mg/dL — AB
Leukocytes, UA: NEGATIVE
Nitrite, UA: NEGATIVE
Protein Ur, POC: NEGATIVE mg/dL
Spec Grav, UA: 1.02 (ref 1.010–1.025)
Urobilinogen, UA: 0.2 E.U./dL
pH, UA: 5.5 (ref 5.0–8.0)

## 2023-01-15 MED ORDER — CIPROFLOXACIN HCL 500 MG PO TABS: 500.0000 mg | ORAL_TABLET | Freq: Two times a day (BID) | ORAL | 0 refills | Status: AC

## 2023-01-15 NOTE — ED Provider Notes (Signed)
[facility]    CSN: [id number] Arrival date & time: 01/15/23  0800      History   Chief Complaint No chief complaint on file.   HPI Daniel Scott is a 62 y.o. male.   The history is provided by the patient.   Patient presents for complaints of pain with urination, decreased urine stream, fever, chills, suprapubic pressure.  Symptoms started over the last several days.  Patient states that he does have a history of prostatitis.  He also states that he had an MRI of his hip which also showed an enlarged prostate recently.  Patient also endorses urinary frequency, urgency, and hesitancy.  Patient denies foul-smelling urine, hematuria, scrotal/testicular pain/swelling, abdominal pain, nausea, vomiting, or diarrhea.  Patient states that he was taking Tylenol for his fever, which helped.  Patient states that he did reach out to his primary care physician's office who did make a referral to urology.  He is scheduled to see urology in June.  Patient reports previous PSAs were within normal limits.  Past Medical History:  Diagnosis Date   Anxiety    Arthritis    hands and feet   Back pain    DDD (degenerative disc disease), cervical    Depression    Diabetes mellitus without complication (HCC)    type 2   Edema of both lower extremities    Fatty liver    GERD (gastroesophageal reflux disease)    Hiatal hernia    Hyperlipidemia    Hypertension    Hypothyroidism    Joint pain    Morbid obesity (HCC)    Neuromuscular disorder (HCC)    stenosis   Palpitations    Pneumonia 1990   PONV (postoperative nausea and vomiting)    Right hip pain    Stenosis of cervical spine    Thyroid disease     Patient Active Problem List   Diagnosis Date Noted   Enlarged prostate 12/30/2022   Former heavy tobacco smoker 12/30/2022   Elevated liver enzymes 12/13/2022   LAP-BAND surgery status 11/30/2022   Other fatigue 11/30/2022   SOB (shortness of breath) on exertion 11/30/2022    Depression screen 11/30/2022   Class 3 severe obesity with serious comorbidity and body mass index (BMI) of 45.0 to 49.9 in adult [facility]) 11/17/2022   Suspected sleep apnea 11/17/2022   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) 09/08/2022   NSVT (nonsustained ventricular tachycardia) (HCC) 09/08/2022   Chronic right hip pain 06/06/2022   Chronic midline low back pain with bilateral sciatica 03/02/2022   Lumbar nerve root impingement 03/02/2022   Erectile dysfunction 03/02/2022   Aortic atherosclerosis (HCC) 08/03/2021   Acquired hypothyroidism 08/03/2021   DDD (degenerative disc disease), cervical 02/18/2021   Hypertension associated with type 2 diabetes mellitus (HCC) 06/13/2020   Type 2 diabetes mellitus with hyperglycemia, without long-term current use of insulin (HCC) 03/10/2020   Morbid obesity (HCC) 03/10/2020   Hyperlipidemia associated with type 2 diabetes mellitus (HCC)    GERD (gastroesophageal reflux disease)     Past Surgical History:  Procedure Laterality Date   ANTERIOR CERVICAL DECOMP/DISCECTOMY FUSION N/A 06/02/2020   Procedure: ANTERIOR CERVICAL DECOMPRESSION/DISCECTOMY FUSIONCERVICAL FOUR- CERVICAL FIVE, CERVICAL FIVE- CERVICAL SIX;  Surgeon: Bethann Goo, DO;  Location: [facility] OR;  Service: Neurosurgery;  Laterality: N/A;  ANTERIOR CERVICAL DECOMPRESSION/DISCECTOMY FUSIONCERVICAL FOUR- CERVICAL FIVE, CERVICAL FIVE- CERVICAL SIX   CARDIAC CATHETERIZATION  1996; 2006   [facility]   COLONOSCOPY WITH PROPOFOL N/A 12/08/2022   Procedure: COLONOSCOPY WITH PROPOFOL;  Surgeon: Lanelle Bal, DO;  Location: [facility];  Service: Endoscopy;  Laterality: N/A;  10:00 am , asa 3   HYDROCELE EXCISION     LAPAROSCOPIC GASTRIC BANDING     LAPAROSCOPIC ROUX-EN-Y GASTRIC BYPASS WITH UPPER ENDOSCOPY AND REMOVAL OF LAP BAND  02/2012   LEFT HEART CATH AND CORONARY ANGIOGRAPHY N/A 04/21/2022   Procedure: LEFT HEART CATH  AND CORONARY ANGIOGRAPHY;  Surgeon: Lyn Records, MD;  Location: [facility] INVASIVE CV LAB;  Service: Cardiovascular;  Laterality: N/A;   ligament replacement Right    right hand   POLYPECTOMY  12/08/2022   Procedure: POLYPECTOMY;  Surgeon: Lanelle Bal, DO;  Location: [facility];  Service: Endoscopy;;   TONSILLECTOMY         Home Medications    Prior to Admission medications   Medication Sig Start Date End Date Taking? Authorizing Provider  ciprofloxacin (CIPRO) 500 MG tablet Take 1 tablet (500 mg total) by mouth 2 (two) times daily for 14 days. 01/15/23 01/29/23 Yes Bryony Kaman-Warren, Sadie Haber, NP  amLODipine (NORVASC) 10 MG tablet Take 1 tablet (10 mg total) by mouth daily. 06/06/22   Gwenlyn Fudge, FNP  APPLE CIDER VINEGAR PO Take 450 mg by mouth 2 (two) times daily. 05/06/21   [provider]  aspirin 325 MG EC tablet Take 325 mg by mouth daily.    [provider]  atorvastatin (LIPITOR) 80 MG tablet Take 1 tablet (80 mg total) by mouth daily. 06/06/22   Gwenlyn Fudge, FNP  Cholecalciferol (DIALYVITE VITAMIN D 5000) 125 MCG (5000 UT) capsule Take 5,000 Units by mouth daily.    [provider]  CINNAMON PO Take 1,000 mg by mouth 2 (two) times daily. 05/06/21   [provider]  clotrimazole-betamethasone (LOTRISONE) cream Apply 1 Application topically daily. 12/30/22   Gabriel Earing, FNP  Dulaglutide (TRULICITY) 4.5 MG/0.5ML SOPN INJECT 4.5 MG AS DIRECTED ONCE A WEEK. 12/29/22   Gabriel Earing, FNP  empagliflozin (JARDIANCE) 25 MG TABS tablet TAKE 1 TABLET BY MOUTH EVERY DAY BEFORE BREAKFAST 01/03/23   Gabriel Earing, FNP  Garlic 1000 MG CAPS Take 1,000 mg by mouth 2 (two) times daily.    [provider]  ibuprofen (ADVIL) 200 MG tablet Take 400 mg by mouth every 6 (six) hours as needed for moderate pain. 09/04/22   [provider]  Boris Lown Oil 500 MG CAPS Take 500 mg by mouth daily.    [provider]  levothyroxine  (SYNTHROID) 75 MCG tablet TAKE 1 TABLET BY MOUTH EVERY DAY BEFORE BREAKFAST 07/18/22   Gabriel Earing, FNP  lisinopril (ZESTRIL) 20 MG tablet Take 1 tablet (20 mg total) by mouth daily. 06/06/22   Gwenlyn Fudge, FNP  metFORMIN (GLUCOPHAGE-XR) 500 MG 24 hr tablet Take 2 tablets (1,000 mg total) by mouth 2 (two) times daily with a meal. 01/03/23   Gabriel Earing, FNP  metoprolol succinate (TOPROL-XL) 25 MG 24 hr tablet Take 0.5 tablets (12.5 mg total) by mouth daily. Take with or immediately following a meal. 04/14/22 04/09/23  Tobb, Kardie, DO  Multiple Vitamin (MULTIVITAMIN ADULT PO) Take 1 tablet by mouth daily.     [provider]  nitroGLYCERIN (NITROSTAT) 0.4 MG SL tablet Place 0.4 mg under the tongue every 5 (five) minutes as needed for chest pain.    [provider]  olopatadine (PATANOL) 0.1 %  ophthalmic solution Place 1 drop into both eyes daily as needed for allergies.    [provider]  omeprazole (PRILOSEC) 20 MG capsule TAKE 1 CAPSULE BY MOUTH EVERY DAY 01/03/23   Gabriel Earing, FNP  psyllium (HYDROCIL/METAMUCIL) 95 % PACK Take 1 packet by mouth daily.    [provider]  spironolactone (ALDACTONE) 25 MG tablet TAKE 1 TABLET (25 MG TOTAL) BY MOUTH DAILY. 10/24/22   Gabriel Earing, FNP  Turmeric (QC TUMERIC COMPLEX PO) Take 1 capsule by mouth in the morning and at bedtime. 10/24/22   [provider]    Family History Family History  Problem Relation Age of Onset   Vascular Disease Mother    Cancer Mother        Bone marrow   Kidney disease Mother    Thyroid disease Mother    Hypertension Mother    Diabetes Mother    Depression Mother    Coronary artery disease Father    Colon cancer Father    Heart disease Father    Hyperlipidemia Father    Hypertension Father    Sudden death Father    Breast cancer Maternal Grandmother    Hypertension Maternal Grandmother    Stroke Maternal Grandmother    Lung cancer Maternal  Grandfather    Diabetes Maternal Grandfather    Heart disease Maternal Grandfather    Hypertension Maternal Grandfather    Stroke Maternal Grandfather    Hypertension Paternal Grandmother    Heart disease Paternal Grandfather    Hypertension Paternal Grandfather    Spina bifida Daughter    Depression Son    Anxiety disorder Son    Hypertension Son    Autism Son    Bipolar disorder Son    Autism Son     Social History Social History   Tobacco Use   Smoking status: Former    Types: Cigarettes    Quit date: 05/2009    Years since quitting: 13.7   Smokeless tobacco: Never  Vaping Use   Vaping Use: Never used  Substance Use Topics   Alcohol use: Yes    Comment: Couple times a week   Drug use: Yes    Types: Marijuana    Comment: occ     Allergies   Sulfa antibiotics   Review of Systems Review of Systems Per HPI  Physical Exam Triage Vital Signs ED Triage Vitals  Enc Vitals Group     BP 01/15/23 0806 (!) 143/83     Pulse Rate 01/15/23 0806 78     Resp 01/15/23 0806 18     Temp 01/15/23 0806 97.9 F (36.6 C)     Temp Source 01/15/23 0806 Oral     SpO2 01/15/23 0806 96 %     Weight --      Height --      Head Circumference --      Peak Flow --      Pain Score 01/15/23 0808 0     Pain Loc --      Pain Edu? --      Excl. in GC? --    No data found.  Updated Vital Signs BP (!) 143/83 (BP Location: Right Arm)   Pulse 78   Temp 97.9 F (36.6 C) (Oral)   Resp 18   SpO2 96%   Visual Acuity Right Eye Distance:   Left Eye Distance:   Bilateral Distance:    Right Eye Near:   Left Eye Near:  Bilateral Near:     Physical Exam Vitals and nursing note reviewed.  Constitutional:      General: He is not in acute distress.    Appearance: Normal appearance.  HENT:     Head: Normocephalic.  Eyes:     Extraocular Movements: Extraocular movements intact.     Conjunctiva/sclera: Conjunctivae normal.     Pupils: Pupils are equal, round, and reactive to  light.  Cardiovascular:     Rate and Rhythm: Normal rate and regular rhythm.     Pulses: Normal pulses.     Heart sounds: Normal heart sounds.  Pulmonary:     Effort: Pulmonary effort is normal. No respiratory distress.     Breath sounds: Normal breath sounds. No stridor. No wheezing, rhonchi or rales.  Abdominal:     General: Bowel sounds are normal.     Palpations: Abdomen is soft.     Tenderness: There is abdominal tenderness in the suprapubic area. There is no right CVA tenderness or left CVA tenderness.  Musculoskeletal:     Cervical back: Normal range of motion.  Lymphadenopathy:     Cervical: No cervical adenopathy.  Skin:    General: Skin is warm and dry.  Neurological:     General: No focal deficit present.     Mental Status: He is alert and oriented to person, place, and time.  Psychiatric:        Mood and Affect: Mood normal.        Behavior: Behavior normal.      UC Treatments / Results  Labs (all labs ordered are listed, but only abnormal results are displayed) Labs Reviewed  POCT URINALYSIS DIP (MANUAL ENTRY) - Abnormal; Notable for the following components:      Result Value   Glucose, UA >=1,000 (*)    Ketones, POC UA trace (5) (*)    Blood, UA moderate (*)    All other components within normal limits    EKG   Radiology No results found.  Procedures Procedures (including critical care time)  Medications Ordered in UC Medications - No data to display  Initial Impression / Assessment and Plan / UC Course  I have reviewed the triage vital signs and the nursing notes.  Pertinent labs & imaging results that were available during my care of the patient were reviewed by me and considered in my medical decision making (see chart for details).  The patient is well-appearing, he is in no acute distress, vital signs are stable.  Urinalysis is negative for an obvious UTI.  Suspect possible prostatitis given the enlarged prostate seen on his previous MRI  and history of prostatitis.  Will treat empirically with Cipro 500 mg twice daily.  Urine culture is pending.  Patient is scheduled to see urology on 02/23/2023.  Supportive care recommendations were provided and discussed with the patient to include increasing his fluid intake, continuing over-the-counter analgesics for pain or discomfort, and developing a toileting schedule.  Will have patient follow-up with his PCP for continued antibiotic therapy if treatment is improving his symptoms given the long duration that is required for his diagnosis.  Patient is in agreement with this plan of care and verbalizes understanding.  All questions were answered.  Patient stable for discharge.  Final Clinical Impressions(s) / UC Diagnoses   Final diagnoses:  Dysuria  Decreased urine stream     Discharge Instructions      The urinalysis does not indicate an obvious urinary tract infection.  A urine  culture is pending.  You will be contacted if the pending urine culture results are positive. Take medication as prescribed. Increase your fluid intake.  You should be drinking at least 8-10 8 ounce glasses of water daily while symptoms persist. Recommend developing a toileting schedule that allows you to urinate at least every 2 hours. May continue over-the-counter Tylenol as needed for pain, fever, or general discomfort. I am treating you for the next 2 weeks.  If you continue to experience symptoms, I would like for you to follow-up with your primary care physician for further evaluation and further treatment if needed. Keep scheduled appointment with urology for June. Go to the emergency department immediately if you develop fever, chills, worsening pain with urination, continued decreased urine stream, or other concerns. Follow-up as needed.     ED Prescriptions     Medication Sig Dispense Auth. Provider   ciprofloxacin (CIPRO) 500 MG tablet Take 1 tablet (500 mg total) by mouth 2 (two) times daily  for 14 days. 28 tablet Kang Ishida-Warren, Sadie Haber, NP      PDMP not reviewed this encounter.   Abran Cantor, NP 01/15/23 [phone]

## 2023-01-15 NOTE — ED Triage Notes (Signed)
Painful urination since Thursday with frequent urination.  States had a low grade fever on Friday.

## 2023-01-15 NOTE — Discharge Instructions (Signed)
The urinalysis does not indicate an obvious urinary tract infection.  A urine culture is pending.  You will be contacted if the pending urine culture results are positive. Take medication as prescribed. Increase your fluid intake.  You should be drinking at least 8-10 8 ounce glasses of water daily while symptoms persist. Recommend developing a toileting schedule that allows you to urinate at least every 2 hours. May continue over-the-counter Tylenol as needed for pain, fever, or general discomfort. I am treating you for the next 2 weeks.  If you continue to experience symptoms, I would like for you to follow-up with your primary care physician for further evaluation and further treatment if needed. Keep scheduled appointment with urology for June. Go to the emergency department immediately if you develop fever, chills, worsening pain with urination, continued decreased urine stream, or other concerns. Follow-up as needed.

## 2023-01-16 ENCOUNTER — Ambulatory Visit (INDEPENDENT_AMBULATORY_CARE_PROVIDER_SITE_OTHER): Payer: 59 | Admitting: Internal Medicine

## 2023-01-16 ENCOUNTER — Encounter (INDEPENDENT_AMBULATORY_CARE_PROVIDER_SITE_OTHER): Payer: Self-pay

## 2023-01-19 ENCOUNTER — Other Ambulatory Visit: Payer: Self-pay | Admitting: Family Medicine

## 2023-01-19 ENCOUNTER — Encounter: Payer: Self-pay | Admitting: Family Medicine

## 2023-01-19 DIAGNOSIS — I10 Essential (primary) hypertension: Secondary | ICD-10-CM

## 2023-01-19 DIAGNOSIS — E1165 Type 2 diabetes mellitus with hyperglycemia: Secondary | ICD-10-CM

## 2023-01-19 MED ORDER — VICTOZA 18 MG/3ML ~~LOC~~ SOPN
1.8000 mg | PEN_INJECTOR | Freq: Every day | SUBCUTANEOUS | 11 refills | Status: DC
Start: 2023-01-19 — End: 2023-04-05

## 2023-01-20 ENCOUNTER — Ambulatory Visit: Payer: 59 | Admitting: Family Medicine

## 2023-01-20 ENCOUNTER — Encounter: Payer: Self-pay | Admitting: Family Medicine

## 2023-01-20 VITALS — BP 128/80 | HR 110 | Temp 96.7°F | Ht 70.0 in | Wt 319.0 lb

## 2023-01-20 DIAGNOSIS — N400 Enlarged prostate without lower urinary tract symptoms: Secondary | ICD-10-CM | POA: Diagnosis not present

## 2023-01-20 DIAGNOSIS — R300 Dysuria: Secondary | ICD-10-CM

## 2023-01-20 LAB — URINALYSIS, ROUTINE W REFLEX MICROSCOPIC
Bilirubin, UA: NEGATIVE
Leukocytes,UA: NEGATIVE
Nitrite, UA: NEGATIVE
Protein,UA: NEGATIVE
Specific Gravity, UA: 1.025 (ref 1.005–1.030)
Urobilinogen, Ur: 0.2 mg/dL (ref 0.2–1.0)
pH, UA: 5 (ref 5.0–7.5)

## 2023-01-20 LAB — MICROSCOPIC EXAMINATION
Bacteria, UA: NONE SEEN
Renal Epithel, UA: NONE SEEN /hpf
WBC, UA: NONE SEEN /hpf (ref 0–5)

## 2023-01-20 MED ORDER — TAMSULOSIN HCL 0.4 MG PO CAPS
0.4000 mg | ORAL_CAPSULE | Freq: Every day | ORAL | 0 refills | Status: DC
Start: 2023-01-20 — End: 2023-02-13

## 2023-01-20 NOTE — Progress Notes (Signed)
Acute Office Visit  Subjective:     Patient ID: Daniel Scott, male    DOB: 03-Aug-1961, 62 y.o.   MRN: [id number]  Chief Complaint  Patient presents with   Follow-up    01/15/2023 (31 minutes) [facility]- prostatitis     Dysuria  This is a new problem. Episode onset: 8 days. The problem occurs every urination. Progression since onset: slight improvement. The quality of the pain is described as burning (sharp). Maximum temperature: initially had low grade fever, no fever in last 36 hours. Associated symptoms include hesitancy, nausea (mild occasionally) and urgency. Pertinent negatives include no discharge, flank pain, frequency, hematuria or vomiting. He has tried acetaminophen, antibiotics and NSAIDs (cipro) for the symptoms.   Denies rectal bleeding, rectal pain, penile discharge, swelling, pelvic pain. He was seen at [facility] on 01/15/23. He had UA with moderate blood, trace ketones, and glucose. Culture is still pending. He was started on cipro for presumed prostatitis given history and recent MRI with enlarged prostate noted. He reports he does feel a little better. Fever has resolved and frequency has improved. Rx for cipro was for 14 days.   Review of Systems  Gastrointestinal:  Positive for nausea (mild occasionally). Negative for vomiting.  Genitourinary:  Positive for dysuria, hesitancy and urgency. Negative for flank pain, frequency and hematuria.        Objective:    BP 128/80   Pulse (!) 110   Temp (!) 96.7 F (35.9 C) (Temporal)   Ht 5\' 10"  (1.778 m)   Wt (!) 319 lb (144.7 kg)   SpO2 95%   BMI 45.77 kg/m  Wt Readings from Last 3 Encounters:  01/20/23 (!) 319 lb (144.7 kg)  01/12/23 (!) 328 lb 12.8 oz (149.1 kg)  01/09/23 (!) 326 lb 12.8 oz (148.2 kg)      Physical Exam Vitals and nursing note reviewed.  Constitutional:      General: He is not in acute distress.    Appearance: He is not ill-appearing, toxic-appearing or diaphoretic.   Pulmonary:     Effort: Pulmonary effort is normal. No respiratory distress.  Abdominal:     General: There is no distension.     Palpations: Abdomen is soft.     Tenderness: There is no abdominal tenderness. There is no right CVA tenderness, left CVA tenderness, guarding or rebound.  Musculoskeletal:     Right lower leg: No edema.     Left lower leg: No edema.  Skin:    General: Skin is warm and dry.  Neurological:     General: No focal deficit present.     Mental Status: He is alert and oriented to person, place, and time.  Psychiatric:        Mood and Affect: Mood normal.        Behavior: Behavior normal.     No results found for any visits on 01/20/23.  Urine dipstick shows positive for RBC's, positive for glucose, and positive for ketones.  Micro exam: 0 WBC's per HPF and 0-2 RBC's per HPF.      Assessment & Plan:   Daniel Scott was seen today for follow-up.  Diagnoses and all orders for this visit:  Dysuria Enlarged prostate Presumed prostatitis, slowly improving symptoms. UA without bacteria. Culture pending from UC. Complete cipro as prescribed. Can try flomax for symptoms. Discussed will continue cipro if symptoms have not resolved when completed. He has been referred to urology, but appt is still a  few weeks out. Strict return precautions given.  -     Urinalysis, Routine w reflex microscopic -     tamsulosin (FLOMAX) 0.4 MG CAPS capsule; Take 1 capsule (0.4 mg total) by mouth daily.  The patient indicates understanding of these issues and agrees with the plan.  Gabriel Earing, FNP

## 2023-01-27 ENCOUNTER — Encounter: Payer: Self-pay | Admitting: Family Medicine

## 2023-01-27 ENCOUNTER — Telehealth: Payer: Self-pay | Admitting: Family Medicine

## 2023-01-27 DIAGNOSIS — E1165 Type 2 diabetes mellitus with hyperglycemia: Secondary | ICD-10-CM

## 2023-01-27 MED ORDER — INSULIN PEN NEEDLE 32G X 6 MM MISC
3 refills | Status: DC
Start: 2023-01-27 — End: 2023-04-05

## 2023-01-27 NOTE — Telephone Encounter (Signed)
Patient calling because he does not have any needles for his Victoza and when he called the pharmacy, they said we would have to send a prescription for them. Please send to  [id number] - [place], [place] - [address] AT [place] [address]

## 2023-01-27 NOTE — Telephone Encounter (Signed)
This has been taken care of already-please see the MyChart encounter.

## 2023-02-03 ENCOUNTER — Telehealth (HOSPITAL_COMMUNITY): Payer: Self-pay | Admitting: Emergency Medicine

## 2023-02-03 ENCOUNTER — Other Ambulatory Visit: Payer: Self-pay | Admitting: Family Medicine

## 2023-02-03 DIAGNOSIS — E782 Mixed hyperlipidemia: Secondary | ICD-10-CM

## 2023-02-03 DIAGNOSIS — I700 Atherosclerosis of aorta: Secondary | ICD-10-CM

## 2023-02-03 DIAGNOSIS — E1165 Type 2 diabetes mellitus with hyperglycemia: Secondary | ICD-10-CM

## 2023-02-03 NOTE — Telephone Encounter (Signed)
Follow up call to patient, he is improved, no lingering questions or concerns

## 2023-02-10 ENCOUNTER — Telehealth: Payer: Self-pay

## 2023-02-10 NOTE — Telephone Encounter (Signed)
[facility] patient is trying to fill jardiance to early.  Earliest fill date is 02/23/2023.

## 2023-02-12 ENCOUNTER — Other Ambulatory Visit: Payer: Self-pay | Admitting: Family Medicine

## 2023-02-12 DIAGNOSIS — R300 Dysuria: Secondary | ICD-10-CM

## 2023-02-12 DIAGNOSIS — N400 Enlarged prostate without lower urinary tract symptoms: Secondary | ICD-10-CM

## 2023-02-21 ENCOUNTER — Ambulatory Visit (INDEPENDENT_AMBULATORY_CARE_PROVIDER_SITE_OTHER): Payer: 59 | Admitting: Internal Medicine

## 2023-02-21 DIAGNOSIS — Z87438 Personal history of other diseases of male genital organs: Secondary | ICD-10-CM | POA: Insufficient documentation

## 2023-02-21 NOTE — Progress Notes (Deleted)
History of Present Illness: Daniel Scott is a 62 y.o. male who presents today as a new patient at [facility] [place]. All available relevant medical records have been reviewed.  - GU History: 1. BPH with LUTS (urinary frequency, urgency, and hesitancy). PSA was normal (2.6) on 12/30/2022. 2. Erectile dysfunction. 3. Prior episodic prostatitis.   Recent history:  - 01/15/2023: Seen in ER for "pain with urination, decreased urine stream, fever, chills, suprapubic pressure." UA positive for moderate blood; no evidence of UTI. Prescribed Cipro 500 mg 2x/day x14 days for presumed prostatitis.   Today: He reports chief complaint of ***  He reports *** urinary stream. He {Actions; denies-reports:120008} urinary hesitancy, urgency, frequency, dysuria, gross hematuria, straining to void, or sensations of incomplete emptying.   Fall Screening: Do you usually have a device to assist in your mobility? {yes/no:20286} ***cane / ***walker / ***wheelchair  Medications: Current Outpatient Medications  Medication Sig Dispense Refill   amLODipine (NORVASC) 10 MG tablet TAKE 1 TABLET BY MOUTH EVERY DAY 90 tablet 0   APPLE CIDER VINEGAR PO Take 450 mg by mouth 2 (two) times daily.     aspirin 325 MG EC tablet Take 325 mg by mouth daily.     atorvastatin (LIPITOR) 80 MG tablet TAKE 1 TABLET BY MOUTH EVERY DAY 90 tablet 0   Cholecalciferol (DIALYVITE VITAMIN D 5000) 125 MCG (5000 UT) capsule Take 5,000 Units by mouth daily.     CINNAMON PO Take 1,000 mg by mouth 2 (two) times daily.     clotrimazole-betamethasone (LOTRISONE) cream Apply 1 Application topically daily. 90 g 0   empagliflozin (JARDIANCE) 25 MG TABS tablet TAKE 1 TABLET BY MOUTH EVERY DAY BEFORE BREAKFAST 90 tablet 0   Garlic 1000 MG CAPS Take 1,000 mg by mouth 2 (two) times daily.     ibuprofen (ADVIL) 200 MG tablet Take 400 mg by mouth every 6 (six) hours as needed for moderate pain.     Insulin Pen Needle 32G X 6 MM MISC UAD  with saxenda 100 each 3   Krill Oil 500 MG CAPS Take 500 mg by mouth daily.     levothyroxine (SYNTHROID) 75 MCG tablet TAKE 1 TABLET BY MOUTH EVERY DAY BEFORE BREAKFAST 90 tablet 3   liraglutide (VICTOZA) 18 MG/3ML SOPN Inject 1.8 mg into the skin daily. 9 mL 11   lisinopril (ZESTRIL) 20 MG tablet Take 1 tablet (20 mg total) by mouth daily. 90 tablet 1   metFORMIN (GLUCOPHAGE-XR) 500 MG 24 hr tablet Take 2 tablets (1,000 mg total) by mouth 2 (two) times daily with a meal. 360 tablet 3   metoprolol succinate (TOPROL-XL) 25 MG 24 hr tablet Take 0.5 tablets (12.5 mg total) by mouth daily. Take with or immediately following a meal. 45 tablet 3   Multiple Vitamin (MULTIVITAMIN ADULT PO) Take 1 tablet by mouth daily.      nitroGLYCERIN (NITROSTAT) 0.4 MG SL tablet Place 0.4 mg under the tongue every 5 (five) minutes as needed for chest pain.     olopatadine (PATANOL) 0.1 % ophthalmic solution Place 1 drop into both eyes daily as needed for allergies.     omeprazole (PRILOSEC) 20 MG capsule TAKE 1 CAPSULE BY MOUTH EVERY DAY 90 capsule 0   psyllium (HYDROCIL/METAMUCIL) 95 % PACK Take 1 packet by mouth daily.     spironolactone (ALDACTONE) 25 MG tablet TAKE 1 TABLET (25 MG TOTAL) BY MOUTH DAILY. 90 tablet 0   tamsulosin (FLOMAX) 0.4 MG CAPS  capsule TAKE 1 CAPSULE BY MOUTH EVERY DAY 90 capsule 0   Turmeric (QC TUMERIC COMPLEX PO) Take 1 capsule by mouth in the morning and at bedtime.     No current facility-administered medications for this visit.    Allergies: Allergies  Allergen Reactions   Sulfa Antibiotics Rash    Past Medical History:  Diagnosis Date   Anxiety    Arthritis    hands and feet   Back pain    DDD (degenerative disc disease), cervical    Depression    Diabetes mellitus without complication (HCC)    type 2   Edema of both lower extremities    Fatty liver    GERD (gastroesophageal reflux disease)    Hiatal hernia    Hyperlipidemia    Hypertension    Hypothyroidism     Joint pain    Morbid obesity (HCC)    Neuromuscular disorder (HCC)    stenosis   Palpitations    Pneumonia 1990   PONV (postoperative nausea and vomiting)    Right hip pain    Stenosis of cervical spine    Thyroid disease    Past Surgical History:  Procedure Laterality Date   ANTERIOR CERVICAL DECOMP/DISCECTOMY FUSION N/A 06/02/2020   Procedure: ANTERIOR CERVICAL DECOMPRESSION/DISCECTOMY FUSIONCERVICAL FOUR- CERVICAL FIVE, CERVICAL FIVE- CERVICAL SIX;  Surgeon: Dawley, Alan Mulder, DO;  [facility];  Service: Neurosurgery;  Laterality: N/A;  ANTERIOR CERVICAL DECOMPRESSION/DISCECTOMY FUSIONCERVICAL FOUR- CERVICAL FIVE, CERVICAL FIVE- CERVICAL SIX   CARDIAC CATHETERIZATION  1996; 2006   [facility]   COLONOSCOPY WITH PROPOFOL N/A 12/08/2022   Procedure: COLONOSCOPY WITH PROPOFOL;  Surgeon: Lanelle Bal, DO;  [facility];  Service: Endoscopy;  Laterality: N/A;  10:00 am , asa 3   HYDROCELE EXCISION     LAPAROSCOPIC GASTRIC BANDING     LAPAROSCOPIC ROUX-EN-Y GASTRIC BYPASS WITH UPPER ENDOSCOPY AND REMOVAL OF LAP BAND  02/2012   LEFT HEART CATH AND CORONARY ANGIOGRAPHY N/A 04/21/2022   Procedure: LEFT HEART CATH AND CORONARY ANGIOGRAPHY;  Surgeon: Lyn Records, MD;  [facility];  Service: Cardiovascular;  Laterality: N/A;   ligament replacement Right    right hand   POLYPECTOMY  12/08/2022   Procedure: POLYPECTOMY;  Surgeon: Lanelle Bal, DO;  [facility]: [facility];  Service: Endoscopy;;   TONSILLECTOMY     Family History  Problem Relation Age of Onset   Vascular Disease Mother    Cancer Mother        Bone marrow   Kidney disease Mother    Thyroid disease Mother    Hypertension Mother    Diabetes Mother    Depression Mother    Coronary artery disease Father    Colon cancer Father    Heart disease Father    Hyperlipidemia Father    Hypertension Father    Sudden death Father    Breast cancer Maternal Grandmother     Hypertension Maternal Grandmother    Stroke Maternal Grandmother    Lung cancer Maternal Grandfather    Diabetes Maternal Grandfather    Heart disease Maternal Grandfather    Hypertension Maternal Grandfather    Stroke Maternal Grandfather    Hypertension Paternal Grandmother    Heart disease Paternal Grandfather    Hypertension Paternal Grandfather    Spina bifida Daughter    Depression Son    Anxiety disorder Son    Hypertension Son    Autism Son    Bipolar  disorder Son    Autism Son    Social History   Socioeconomic History   Marital status: Married    Spouse name: Theofanis Jim   Number of children: Not on file   Years of education: Not on file   Highest education level: Associate degree: occupational, Scientist, product/process development, or vocational program  Occupational History   Occupation: Retired  Tobacco Use   Smoking status: Former    Types: Cigarettes    Quit date: 05/2009    Years since quitting: 13.8   Smokeless tobacco: Never  Vaping Use   Vaping Use: Never used  Substance and Sexual Activity   Alcohol use: Yes    Comment: Couple times a week   Drug use: Yes    Types: Marijuana    Comment: occ   Sexual activity: Yes    Birth control/protection: None  Other Topics Concern   Not on file  Social History Narrative   Not on file   Social Determinants of Health   Financial Resource Strain: Low Risk  (12/26/2022)   Overall Financial Resource Strain (CARDIA)    Difficulty of Paying Living Expenses: Not very hard  Food Insecurity: No Food Insecurity (12/26/2022)   Hunger Vital Sign    Worried About Running Out of Food in the Last Year: Never true    Ran Out of Food in the Last Year: Never true  Transportation Needs: No Transportation Needs (12/26/2022)   PRAPARE - Administrator, Civil Service (Medical): No    Lack of Transportation (Non-Medical): No  Physical Activity: Unknown (12/26/2022)   Exercise Vital Sign    Days of Exercise per Week: 0 days    Minutes of  Exercise per Session: Not on file  Stress: No Stress Concern Present (12/26/2022)   [employer] of Occupational Health - Occupational Stress Questionnaire    Feeling of Stress : Only a little  Social Connections: Socially Isolated (12/26/2022)   Social Connection and Isolation Panel [NHANES]    Frequency of Communication with Friends and Family: Once a week    Frequency of Social Gatherings with Friends and Family: Once a week    Attends Religious Services: Never    Diplomatic Services operational officer: No    Attends Engineer, structural: Not on file    Marital Status: Married  Catering manager Violence: Not on file    SUBJECTIVE  Review of Systems Constitutional: Patient ***denies any unintentional weight loss or change in strength lntegumentary: Patient ***denies any rashes or pruritus Eyes: Patient denies ***dry eyes ENT: Patient ***denies dry mouth Cardiovascular: Patient ***denies chest pain or syncope Respiratory: Patient ***denies shortness of breath Gastrointestinal: Patient ***denies nausea, vomiting, constipation, or diarrhea Musculoskeletal: Patient ***denies muscle cramps or weakness Neurologic: Patient ***denies convulsions or seizures Psychiatric: Patient ***denies memory problems Allergic/Immunologic: Patient ***denies recent allergic reaction(s) Hematologic/Lymphatic: Patient denies bleeding tendencies Endocrine: Patient ***denies heat/cold intolerance  GU: As per HPI.  OBJECTIVE There were no vitals filed for this visit. There is no height or weight on file to calculate BMI.  Physical Examination  Constitutional: ***No obvious distress; patient is ***non-toxic appearing  Cardiovascular: ***No visible lower extremity edema.  Respiratory: The patient does ***not have audible wheezing/stridor; respirations do ***not appear labored  Gastrointestinal: Abdomen ***non-distended Musculoskeletal: ***Normal ROM of UEs  Skin: ***No obvious rashes/open  sores  Neurologic: CN 2-12 grossly ***intact Psychiatric: Answered questions ***appropriately with ***normal affect  Hematologic/Lymphatic/Immunologic: ***No obvious bruises or sites of spontaneous bleeding  UA: {Desc;  negative/positive:13464} *** WBC/hpf, *** RBC/hpf, bacteria (***) *** nitrites, *** leukocytes, *** blood PVR: *** ml  ASSESSMENT No diagnosis found. ***  Will plan for follow up in *** months or sooner if needed. Pt verbalized understanding and agreement. All questions were answered.  PLAN Advised the following: *** ***No follow-ups on file.  No orders of the defined types were placed in this encounter.   It has been explained that the patient is to follow regularly with their PCP in addition to all other providers involved in their care and to follow instructions provided by these respective offices. Patient advised to contact urology clinic if any urologic-pertaining questions, concerns, new symptoms or problems arise in the interim period.  There are no Patient Instructions on file for this visit.  Electronically signed by:  Donnita Falls, MSN, FNP-C, CUNP 02/21/2023 3:09 PM

## 2023-02-23 ENCOUNTER — Ambulatory Visit: Payer: 59 | Admitting: Urology

## 2023-02-23 DIAGNOSIS — N401 Enlarged prostate with lower urinary tract symptoms: Secondary | ICD-10-CM

## 2023-02-23 DIAGNOSIS — Z87438 Personal history of other diseases of male genital organs: Secondary | ICD-10-CM

## 2023-02-23 DIAGNOSIS — N529 Male erectile dysfunction, unspecified: Secondary | ICD-10-CM

## 2023-03-01 NOTE — Addendum Note (Signed)
Addended by: Brunetta Genera on: 03/01/2023 09:52 AM   Modules accepted: Orders

## 2023-03-08 NOTE — Progress Notes (Signed)
Name: LEDGER SILERIO DOB: 19-Jul-1961 MRN: [id number]  History of Present Illness: Mr. Bulkley is a 62 y.o. male who presents today as a new patient at [facility] [place]. All available relevant medical records have been reviewed.  - GU History: 1. BPH with LUTS (urinary frequency, urgency, and hesitancy). PSA was normal (2.6) on 12/30/2022. 2. Erectile dysfunction. 3. Prior episodic prostatitis.   Recent history:  - 01/15/2023: Seen in ER for "pain with urination, decreased urine stream, fever, chills, suprapubic pressure." UA positive for moderate blood; no evidence of UTI. Prescribed Cipro 500 mg 2x/day x14 days for presumed prostatitis.   - 01/20/2023: Urine microscopy negative (0 WBC/hpf, 0-2 RBC/hpf, no bacteria)   Today: He reports doing well with no acute concerns. Reports solid urinary stream. He denies urinary hesitancy, urgency, frequency, dysuria, gross hematuria, straining to void, or sensations of incomplete emptying. Denies pelvic pain.   Fall Screening: Do you usually have a device to assist in your mobility? No   Medications: Current Outpatient Medications  Medication Sig Dispense Refill   amLODipine (NORVASC) 10 MG tablet TAKE 1 TABLET BY MOUTH EVERY DAY 90 tablet 0   APPLE CIDER VINEGAR PO Take 450 mg by mouth 2 (two) times daily.     aspirin 325 MG EC tablet Take 325 mg by mouth daily.     atorvastatin (LIPITOR) 80 MG tablet TAKE 1 TABLET BY MOUTH EVERY DAY 90 tablet 0   Cholecalciferol (DIALYVITE VITAMIN D 5000) 125 MCG (5000 UT) capsule Take 5,000 Units by mouth daily.     CINNAMON PO Take 1,000 mg by mouth 2 (two) times daily.     clotrimazole-betamethasone (LOTRISONE) cream Apply 1 Application topically daily. 90 g 0   empagliflozin (JARDIANCE) 25 MG TABS tablet TAKE 1 TABLET BY MOUTH EVERY DAY BEFORE BREAKFAST 90 tablet 0   Garlic 1000 MG CAPS Take 1,000 mg by mouth 2 (two) times daily.     Insulin Pen Needle 32G X 6 MM MISC UAD with saxenda 100  each 3   Krill Oil 500 MG CAPS Take 500 mg by mouth daily.     levothyroxine (SYNTHROID) 75 MCG tablet TAKE 1 TABLET BY MOUTH EVERY DAY BEFORE BREAKFAST 90 tablet 3   liraglutide (VICTOZA) 18 MG/3ML SOPN Inject 1.8 mg into the skin daily. 9 mL 11   lisinopril (ZESTRIL) 20 MG tablet Take 1 tablet (20 mg total) by mouth daily. 90 tablet 1   metFORMIN (GLUCOPHAGE-XR) 500 MG 24 hr tablet Take 2 tablets (1,000 mg total) by mouth 2 (two) times daily with a meal. 360 tablet 3   metoprolol succinate (TOPROL-XL) 25 MG 24 hr tablet Take 0.5 tablets (12.5 mg total) by mouth daily. Take with or immediately following a meal. 45 tablet 3   Multiple Vitamin (MULTIVITAMIN ADULT PO) Take 1 tablet by mouth daily.      nitroGLYCERIN (NITROSTAT) 0.4 MG SL tablet Place 0.4 mg under the tongue every 5 (five) minutes as needed for chest pain.     olopatadine (PATANOL) 0.1 % ophthalmic solution Place 1 drop into both eyes daily as needed for allergies.     omeprazole (PRILOSEC) 20 MG capsule TAKE 1 CAPSULE BY MOUTH EVERY DAY 90 capsule 0   psyllium (HYDROCIL/METAMUCIL) 95 % PACK Take 1 packet by mouth daily.     spironolactone (ALDACTONE) 25 MG tablet TAKE 1 TABLET (25 MG TOTAL) BY MOUTH DAILY. 90 tablet 0   Turmeric (QC TUMERIC COMPLEX PO) Take 1 capsule by  mouth in the morning and at bedtime.     ibuprofen (ADVIL) 200 MG tablet Take 400 mg by mouth every 6 (six) hours as needed for moderate pain.     tamsulosin (FLOMAX) 0.4 MG CAPS capsule TAKE 1 CAPSULE BY MOUTH EVERY DAY 90 capsule 0   No current facility-administered medications for this visit.    Allergies: Allergies  Allergen Reactions   Sulfa Antibiotics Rash    Past Medical History:  Diagnosis Date   Anxiety    Arthritis    hands and feet   Back pain    DDD (degenerative disc disease), cervical    Depression    Diabetes mellitus without complication (HCC)    type 2   Edema of both lower extremities    Fatty liver    GERD (gastroesophageal  reflux disease)    Hiatal hernia    Hyperlipidemia    Hypertension    Hypothyroidism    Joint pain    Morbid obesity (HCC)    Neuromuscular disorder (HCC)    stenosis   Palpitations    Pneumonia 1990   PONV (postoperative nausea and vomiting)    Right hip pain    Stenosis of cervical spine    Thyroid disease    Past Surgical History:  Procedure Laterality Date   ANTERIOR CERVICAL DECOMP/DISCECTOMY FUSION N/A 06/02/2020   Procedure: ANTERIOR CERVICAL DECOMPRESSION/DISCECTOMY FUSIONCERVICAL FOUR- CERVICAL FIVE, CERVICAL FIVE- CERVICAL SIX;  Surgeon: Dawley, Alan Mulder, DO;  [facility]: [facility] OR;  Service: Neurosurgery;  Laterality: N/A;  ANTERIOR CERVICAL DECOMPRESSION/DISCECTOMY FUSIONCERVICAL FOUR- CERVICAL FIVE, CERVICAL FIVE- CERVICAL SIX   CARDIAC CATHETERIZATION  1996; 2006   [facility]   COLONOSCOPY WITH PROPOFOL N/A 12/08/2022   Procedure: COLONOSCOPY WITH PROPOFOL;  Surgeon: Lanelle Bal, DO;  [facility];  Service: Endoscopy;  Laterality: N/A;  10:00 am , asa 3   HYDROCELE EXCISION     LAPAROSCOPIC GASTRIC BANDING     LAPAROSCOPIC ROUX-EN-Y GASTRIC BYPASS WITH UPPER ENDOSCOPY AND REMOVAL OF LAP BAND  02/2012   LEFT HEART CATH AND CORONARY ANGIOGRAPHY N/A 04/21/2022   Procedure: LEFT HEART CATH AND CORONARY ANGIOGRAPHY;  Surgeon: Lyn Records, MD;  [facility];  Service: Cardiovascular;  Laterality: N/A;   ligament replacement Right    right hand   POLYPECTOMY  12/08/2022   Procedure: POLYPECTOMY;  Surgeon: Lanelle Bal, DO;  [facility];  Service: Endoscopy;;   TONSILLECTOMY     Family History  Problem Relation Age of Onset   Vascular Disease Mother    Cancer Mother        Bone marrow   Kidney disease Mother    Thyroid disease Mother    Hypertension Mother    Diabetes Mother    Depression Mother    Coronary artery disease Father    Colon cancer Father    Heart disease Father    Hyperlipidemia Father     Hypertension Father    Sudden death Father    Breast cancer Maternal Grandmother    Hypertension Maternal Grandmother    Stroke Maternal Grandmother    Lung cancer Maternal Grandfather    Diabetes Maternal Grandfather    Heart disease Maternal Grandfather    Hypertension Maternal Grandfather    Stroke Maternal Grandfather    Hypertension Paternal Grandmother    Heart disease Paternal Grandfather    Hypertension Paternal Grandfather    Spina bifida Daughter    Depression Son  Anxiety disorder Son    Hypertension Son    Autism Son    Bipolar disorder Son    Autism Son    Social History   Socioeconomic History   Marital status: Married    Spouse name: Ramona Clarida   Number of children: Not on file   Years of education: Not on file   Highest education level: Associate degree: occupational, Scientist, product/process development, or vocational program  Occupational History   Occupation: Retired  Tobacco Use   Smoking status: Former    Types: Cigarettes    Quit date: 05/2009    Years since quitting: 13.8   Smokeless tobacco: Never  Vaping Use   Vaping Use: Never used  Substance and Sexual Activity   Alcohol use: Yes    Comment: Couple times a week   Drug use: Yes    Types: Marijuana    Comment: occ   Sexual activity: Yes    Birth control/protection: None  Other Topics Concern   Not on file  Social History Narrative   Not on file   Social Determinants of Health   Financial Resource Strain: Low Risk  (12/26/2022)   Overall Financial Resource Strain (CARDIA)    Difficulty of Paying Living Expenses: Not very hard  Food Insecurity: No Food Insecurity (12/26/2022)   Hunger Vital Sign    Worried About Running Out of Food in the Last Year: Never true    Ran Out of Food in the Last Year: Never true  Transportation Needs: No Transportation Needs (12/26/2022)   PRAPARE - Administrator, Civil Service (Medical): No    Lack of Transportation (Non-Medical): No  Physical Activity: Unknown  (12/26/2022)   Exercise Vital Sign    Days of Exercise per Week: 0 days    Minutes of Exercise per Session: Not on file  Stress: No Stress Concern Present (12/26/2022)   [employer] of Occupational Health - Occupational Stress Questionnaire    Feeling of Stress : Only a little  Social Connections: Socially Isolated (12/26/2022)   Social Connection and Isolation Panel [NHANES]    Frequency of Communication with Friends and Family: Once a week    Frequency of Social Gatherings with Friends and Family: Once a week    Attends Religious Services: Never    Diplomatic Services operational officer: No    Attends Engineer, structural: Not on file    Marital Status: Married  Catering manager Violence: Not on file    SUBJECTIVE  Review of Systems Constitutional: Patient denies any unintentional weight loss or change in strength lntegumentary: Patient denies any rashes or pruritus Eyes: Patient denies dry eyes ENT: Patient denies dry mouth Cardiovascular: Patient denies chest pain or syncope Respiratory: Patient denies shortness of breath Gastrointestinal: Patient denies nausea, vomiting, constipation, or diarrhea Musculoskeletal: Patient reports chronic low back pain Neurologic: Patient denies convulsions or seizures Psychiatric: Patient denies memory problems Allergic/Immunologic: Patient denies recent allergic reaction(s) Hematologic/Lymphatic: Patient denies bleeding tendencies Endocrine: Patient denies heat/cold intolerance  GU: As per HPI.  OBJECTIVE Vitals:   03/15/23 0905  BP: 121/78  Pulse: 71   Body mass index is 45.63 kg/m.  Physical Examination  Constitutional: No obvious distress; patient is non-toxic appearing  Cardiovascular: No visible lower extremity edema.  Respiratory: The patient does not have audible wheezing/stridor; respirations do not appear labored  Gastrointestinal: Abdomen non-distended Musculoskeletal: Normal ROM of UEs  Skin: No obvious  rashes/open sores  Neurologic: CN 2-12 grossly intact Psychiatric: Answered questions  appropriately with normal affect  Hematologic/Lymphatic/Immunologic: No obvious bruises or sites of spontaneous bleeding  UA: 0-5 WBC/hpf, 3-10 RBC/hpf, bacteria (none) PVR: 5 ml  ASSESSMENT Microscopic hematuria - Plan: CT ABDOMEN PELVIS W WO CONTRAST  BPH with obstruction/lower urinary tract symptoms - Plan: Urinalysis, Routine w reflex microscopic, BLADDER SCAN AMB NON-IMAGING, PR COMPLEX UROFLOMETRY  History of acute prostatitis  Erectile dysfunction, unspecified erectile dysfunction type  Former smoker - Plan: CT ABDOMEN PELVIS W WO CONTRAST  For asymptomatic microscopic hematuria we discussed possible etiologies including but not limited to: vigorous exercise, sexual activity, stone, trauma, blood thinner use, urinary tract infection, urethral irritation secondary to chronic kidney disease, glomerulonephropathy, BPH, malignancy. We discussed pt's smoking as a risk factor for GU cancer and encouraged continued smoking cessation.  We reviewed the AUA 2020 [facility] guideline and risk stratification for this patient. Based on individual risk factors, pt was advised that the recommended workup includes CT urogram and cystoscopy. Pt decided to pursue this work-up and follow-up afterward to discuss the results and formulate a treatment plan based on the findings. All questions were answered.   PLAN Advised the following: CT hematuria protocol ordered. Return if symptoms worsen or fail to improve, for 1st available cystoscopy with Dr. Ronne Binning.   Orders Placed This Encounter  Procedures   CT ABDOMEN PELVIS W WO CONTRAST    Standing Status:   Future    Standing Expiration Date:   03/14/2024    Order Specific Question:   If indicated for the ordered procedure, I authorize the administration of contrast media per Radiology protocol    Answer:   Yes    Order Specific Question:   Does the patient have a  contrast media/X-ray dye allergy?    Answer:   No    Order Specific Question:   Preferred imaging location?    Answer:   [facility]    Order Specific Question:   If indicated for the ordered procedure, I authorize the administration of oral contrast media per Radiology protocol    Answer:   Yes   Urinalysis, Routine w reflex microscopic   PR COMPLEX UROFLOMETRY   BLADDER SCAN AMB NON-IMAGING    It has been explained that the patient is to follow regularly with their PCP in addition to all other providers involved in their care and to follow instructions provided by these respective offices. Patient advised to contact urology clinic if any urologic-pertaining questions, concerns, new symptoms or problems arise in the interim period.  There are no Patient Instructions on file for this visit.  Electronically signed by:  Donnita Falls, MSN, FNP-C, CUNP 03/15/2023 10:43 AM

## 2023-03-14 ENCOUNTER — Encounter: Payer: Self-pay | Admitting: Family Medicine

## 2023-03-15 ENCOUNTER — Ambulatory Visit: Payer: 59 | Admitting: Urology

## 2023-03-15 ENCOUNTER — Encounter: Payer: Self-pay | Admitting: Urology

## 2023-03-15 ENCOUNTER — Ambulatory Visit: Payer: 59 | Admitting: Family Medicine

## 2023-03-15 ENCOUNTER — Encounter: Payer: Self-pay | Admitting: Family Medicine

## 2023-03-15 VITALS — BP 134/86 | HR 67 | Temp 97.4°F | Resp 20 | Ht 70.0 in | Wt 324.4 lb

## 2023-03-15 VITALS — BP 121/78 | HR 71 | Ht 70.0 in | Wt 318.0 lb

## 2023-03-15 DIAGNOSIS — N401 Enlarged prostate with lower urinary tract symptoms: Secondary | ICD-10-CM | POA: Diagnosis not present

## 2023-03-15 DIAGNOSIS — N138 Other obstructive and reflux uropathy: Secondary | ICD-10-CM | POA: Diagnosis not present

## 2023-03-15 DIAGNOSIS — Z87438 Personal history of other diseases of male genital organs: Secondary | ICD-10-CM | POA: Diagnosis not present

## 2023-03-15 DIAGNOSIS — E1159 Type 2 diabetes mellitus with other circulatory complications: Secondary | ICD-10-CM | POA: Diagnosis not present

## 2023-03-15 DIAGNOSIS — I152 Hypertension secondary to endocrine disorders: Secondary | ICD-10-CM | POA: Diagnosis not present

## 2023-03-15 DIAGNOSIS — R600 Localized edema: Secondary | ICD-10-CM

## 2023-03-15 DIAGNOSIS — R3129 Other microscopic hematuria: Secondary | ICD-10-CM | POA: Diagnosis not present

## 2023-03-15 DIAGNOSIS — N529 Male erectile dysfunction, unspecified: Secondary | ICD-10-CM | POA: Diagnosis not present

## 2023-03-15 DIAGNOSIS — Z87891 Personal history of nicotine dependence: Secondary | ICD-10-CM | POA: Diagnosis not present

## 2023-03-15 LAB — URINALYSIS, ROUTINE W REFLEX MICROSCOPIC
Bilirubin, UA: NEGATIVE
Ketones, UA: NEGATIVE
Leukocytes,UA: NEGATIVE
Nitrite, UA: NEGATIVE
Protein,UA: NEGATIVE
Specific Gravity, UA: 1.015 (ref 1.005–1.030)
Urobilinogen, Ur: 0.2 mg/dL (ref 0.2–1.0)
pH, UA: 5.5 (ref 5.0–7.5)

## 2023-03-15 LAB — MICROSCOPIC EXAMINATION: Bacteria, UA: NONE SEEN

## 2023-03-15 LAB — BLADDER SCAN AMB NON-IMAGING: Scan Result: 5

## 2023-03-15 MED ORDER — SPIRONOLACTONE 50 MG PO TABS: 50.0000 mg | ORAL_TABLET | Freq: Every day | ORAL | 3 refills | Status: DC

## 2023-03-15 NOTE — Progress Notes (Signed)
   Acute Office Visit  Subjective:     Patient ID: Daniel Scott, male    DOB: 1961-04-24, 62 y.o.   MRN: [id number]  Chief Complaint  Patient presents with   Edema    HPI Patient is in today for edema in his feet and ankles for the last 2 weeks. He has been walking 3-5 x a week for the last 2 weeks. He denies pain, erythema, injury, chest pain, shortness of breath, or orthopnea. He denies increased salt in diet. He has not been elevating his feet.   ROS As per HPI.      Objective:    BP 134/86   Pulse 67   Temp (!) 97.4 F (36.3 C) (Oral)   Resp 20   Ht 5\' 10"  (1.778 m)   Wt (!) 324 lb 6 oz (147.1 kg)   SpO2 95%   BMI 46.54 kg/m  Wt Readings from Last 3 Encounters:  03/15/23 (!) 324 lb 6 oz (147.1 kg)  03/15/23 (!) 318 lb (144.2 kg)  01/20/23 (!) 319 lb (144.7 kg)      Physical Exam Vitals and nursing note reviewed.  Constitutional:      General: He is not in acute distress.    Appearance: He is obese. He is not ill-appearing, toxic-appearing or diaphoretic.  Cardiovascular:     Rate and Rhythm: Normal rate and regular rhythm.     Heart sounds: Normal heart sounds. No murmur heard. Pulmonary:     Effort: Pulmonary effort is normal. No respiratory distress.     Breath sounds: Normal breath sounds. No wheezing.  Musculoskeletal:     Cervical back: Neck supple. No rigidity.     Right lower leg: No tenderness. 2+ Edema (nonpitting) present.     Left lower leg: No tenderness. 2+ Edema (nonpitting) present.  Skin:    General: Skin is warm and dry.  Neurological:     General: No focal deficit present.     Mental Status: He is alert and oriented to person, place, and time.  Psychiatric:        Mood and Affect: Mood normal.        Behavior: Behavior normal.        Thought Content: Thought content normal.        Judgment: Judgment normal.       Assessment & Plan:   Kashtyn was seen today for edema.  Diagnoses and all orders for this visit:  Peripheral  edema Hypertension associated with type 2 diabetes mellitus (HCC) Increased bilateral edema for 2 weeks. Will try increased spironolactone dosage. Will repeat CMP in [redacted] weeks along with chronic follow up labs prior to his upcoming appt. Elevate legs, compression socks, low salt diet.  -     spironolactone (ALDACTONE) 50 MG tablet; Take 1 tablet (50 mg total) by mouth daily. -     CMP14+EGFR; Future -     CBC with Differential/Platelet; Future -     TSH; Future -     Bayer DCA Hb A1c Waived; Future  Keep follow up appt, sooner for new or worsening symptoms.   The patient indicates understanding of these issues and agrees with the plan.  Gabriel Earing, FNP

## 2023-03-15 NOTE — Progress Notes (Signed)
post void residual   5 ml

## 2023-03-16 ENCOUNTER — Other Ambulatory Visit: Payer: Self-pay | Admitting: Cardiology

## 2023-03-17 ENCOUNTER — Encounter: Payer: Self-pay | Admitting: Family Medicine

## 2023-03-18 ENCOUNTER — Other Ambulatory Visit: Payer: Self-pay | Admitting: Family Medicine

## 2023-03-18 DIAGNOSIS — E1165 Type 2 diabetes mellitus with hyperglycemia: Secondary | ICD-10-CM

## 2023-03-21 NOTE — Telephone Encounter (Signed)
 Please see below and advise.

## 2023-03-29 ENCOUNTER — Encounter: Payer: Self-pay | Admitting: Family Medicine

## 2023-03-29 ENCOUNTER — Other Ambulatory Visit: Payer: Self-pay | Admitting: Family Medicine

## 2023-03-29 ENCOUNTER — Other Ambulatory Visit: Payer: 59

## 2023-03-29 DIAGNOSIS — E1159 Type 2 diabetes mellitus with other circulatory complications: Secondary | ICD-10-CM | POA: Diagnosis not present

## 2023-03-29 DIAGNOSIS — R319 Hematuria, unspecified: Secondary | ICD-10-CM

## 2023-03-29 DIAGNOSIS — I152 Hypertension secondary to endocrine disorders: Secondary | ICD-10-CM | POA: Diagnosis not present

## 2023-03-29 LAB — CBC WITH DIFFERENTIAL/PLATELET
Basophils Absolute: 0 10*3/uL (ref 0.0–0.2)
EOS (ABSOLUTE): 0.2 10*3/uL (ref 0.0–0.4)
Eos: 3 %
Hematocrit: 42 % (ref 37.5–51.0)
Immature Grans (Abs): 0 10*3/uL (ref 0.0–0.1)
Immature Granulocytes: 0 %
MCHC: 33.6 g/dL (ref 31.5–35.7)
Neutrophils: 64 %
Platelets: 272 10*3/uL (ref 150–450)

## 2023-03-29 LAB — CMP14+EGFR

## 2023-03-29 LAB — BAYER DCA HB A1C WAIVED: HB A1C (BAYER DCA - WAIVED): 6.8 % — ABNORMAL HIGH (ref 4.8–5.6)

## 2023-03-29 LAB — TSH

## 2023-03-30 LAB — CBC WITH DIFFERENTIAL/PLATELET
Basos: 1 %
Hemoglobin: 14.1 g/dL (ref 13.0–17.7)
Lymphocytes Absolute: 1.4 10*3/uL (ref 0.7–3.1)
Lymphs: 22 %
MCH: 30.7 pg (ref 26.6–33.0)
MCV: 92 fL (ref 79–97)
Monocytes Absolute: 0.6 10*3/uL (ref 0.1–0.9)
Monocytes: 10 %
Neutrophils Absolute: 4 10*3/uL (ref 1.4–7.0)
RBC: 4.59 x10E6/uL (ref 4.14–5.80)
RDW: 13.1 % (ref 11.6–15.4)
WBC: 6.3 10*3/uL (ref 3.4–10.8)

## 2023-03-30 LAB — CMP14+EGFR
ALT: 39 IU/L (ref 0–44)
AST: 27 IU/L (ref 0–40)
Bilirubin Total: 0.3 mg/dL (ref 0.0–1.2)
Creatinine, Ser: 0.66 mg/dL — ABNORMAL LOW (ref 0.76–1.27)
Globulin, Total: 1.7 g/dL (ref 1.5–4.5)
Sodium: 140 mmol/L (ref 134–144)
Total Protein: 6.1 g/dL (ref 6.0–8.5)

## 2023-04-05 ENCOUNTER — Ambulatory Visit: Payer: 59 | Admitting: Family Medicine

## 2023-04-05 ENCOUNTER — Encounter: Payer: Self-pay | Admitting: Family Medicine

## 2023-04-05 VITALS — BP 115/79 | HR 74 | Temp 97.8°F | Ht 70.0 in | Wt 318.0 lb

## 2023-04-05 DIAGNOSIS — Z7984 Long term (current) use of oral hypoglycemic drugs: Secondary | ICD-10-CM | POA: Diagnosis not present

## 2023-04-05 DIAGNOSIS — E039 Hypothyroidism, unspecified: Secondary | ICD-10-CM

## 2023-04-05 DIAGNOSIS — I152 Hypertension secondary to endocrine disorders: Secondary | ICD-10-CM

## 2023-04-05 DIAGNOSIS — E785 Hyperlipidemia, unspecified: Secondary | ICD-10-CM | POA: Diagnosis not present

## 2023-04-05 DIAGNOSIS — E1159 Type 2 diabetes mellitus with other circulatory complications: Secondary | ICD-10-CM | POA: Diagnosis not present

## 2023-04-05 DIAGNOSIS — E1165 Type 2 diabetes mellitus with hyperglycemia: Secondary | ICD-10-CM | POA: Diagnosis not present

## 2023-04-05 DIAGNOSIS — K219 Gastro-esophageal reflux disease without esophagitis: Secondary | ICD-10-CM | POA: Diagnosis not present

## 2023-04-05 DIAGNOSIS — R319 Hematuria, unspecified: Secondary | ICD-10-CM | POA: Diagnosis not present

## 2023-04-05 DIAGNOSIS — E1169 Type 2 diabetes mellitus with other specified complication: Secondary | ICD-10-CM | POA: Diagnosis not present

## 2023-04-05 LAB — BMP8+EGFR
BUN/Creatinine Ratio: 16 (ref 10–24)
BUN: 13 mg/dL (ref 8–27)
CO2: 21 mmol/L (ref 20–29)
Calcium: 10 mg/dL (ref 8.6–10.2)
Chloride: 101 mmol/L (ref 96–106)
Creatinine, Ser: 0.82 mg/dL (ref 0.76–1.27)
Glucose: 134 mg/dL — ABNORMAL HIGH (ref 70–99)
Potassium: 4.9 mmol/L (ref 3.5–5.2)
Sodium: 139 mmol/L (ref 134–144)
eGFR: 99 mL/min/{1.73_m2} (ref >59)

## 2023-04-05 NOTE — Progress Notes (Signed)
Established Patient Office Visit  Subjective   Patient ID: Daniel Scott, male    DOB: 09-Aug-1961  Age: 62 y.o. MRN: [id number]  Chief Complaint  Patient presents with   Medical Management of Chronic Issues   Diabetes    HPI T2DM Pt presents for evaluation of Type 2 diabetes mellitus. Patient denies foot ulcerations, increased appetite, nausea, polydipsia, polyuria, visual disturbances, vomiting, and weight loss.  Current diabetic medications include metformin, trulicity, jardiance Compliant with meds - Yes  Current monitoring regimen:  rarely checks  Eye exam current (within one year): yes, will request records Current diet: regular, working on portion control Current exercise:  limited due to chronic hip pain  Urine microalbumin UTD? Yes Is He on ACE inhibitor or angiotensin II receptor blocker?  Yes, lisinopril Is He on statin? Yes atorvastatin Is He on ASA 81 mg daily?  Yes  2. HTN Complaint with meds - Yes Current Medications - amlodipine 10 mg, lisinopril 20 mg, spironlactone 50 mg, metoprolol Checking BP at home - rarely Pertinent ROS:  Headache - No Fatigue - No Visual Disturbances - No Chest pain - No Dyspnea - No Palpitations - No LE edema - Has improved with higher dose of spironolactone  3. HLD On atorvastatin 80 mg.   4. GERD On omeprazole. Reports well controlled. Denies nausea, vomiting, dysphagia. Hx of lap band surgery.   5. Hypothyroid On levothyroxine 75 mcg. Compliant with medication. No symptoms.   6. Hematuria Managed by urology. Microscopic. Has CT upcoming. He would like to have a UA checked today. No urinary symptoms.     Past Medical History:  Diagnosis Date   Anxiety    Arthritis    hands and feet   Back pain    DDD (degenerative disc disease), cervical    Depression    Diabetes mellitus without complication (HCC)    type 2   Edema of both lower extremities    Fatty liver    GERD (gastroesophageal reflux disease)     Hiatal hernia    Hyperlipidemia    Hypertension    Hypothyroidism    Joint pain    Morbid obesity (HCC)    Neuromuscular disorder (HCC)    stenosis   Palpitations    Pneumonia 1990   PONV (postoperative nausea and vomiting)    Right hip pain    Stenosis of cervical spine    Thyroid disease       ROS As per HPI.    Objective:     BP 115/79   Pulse 74   Temp 97.8 F (36.6 C) (Temporal)   Ht 5\' 10"  (1.778 m)   Wt (!) 318 lb (144.2 kg)   SpO2 95%   BMI 45.63 kg/m  Wt Readings from Last 3 Encounters:  04/05/23 (!) 318 lb (144.2 kg)  03/15/23 (!) 324 lb 6 oz (147.1 kg)  03/15/23 (!) 318 lb (144.2 kg)     Physical Exam Vitals and nursing note reviewed.  Constitutional:      General: He is not in acute distress.    Appearance: He is obese. He is not ill-appearing, toxic-appearing or diaphoretic.  HENT:     Head: Normocephalic and atraumatic.  Neck:     Thyroid: No thyroid mass, thyromegaly or thyroid tenderness.     Vascular: No carotid bruit.  Cardiovascular:     Rate and Rhythm: Normal rate and regular rhythm.     Heart sounds: Normal heart sounds. No murmur heard. Pulmonary:  Effort: Pulmonary effort is normal. No respiratory distress.     Breath sounds: Normal breath sounds.  Abdominal:     General: Bowel sounds are normal.     Palpations: Abdomen is soft.  Musculoskeletal:     Cervical back: Neck supple. No rigidity.     Right lower leg: No edema.     Left lower leg: No edema.  Skin:    General: Skin is warm and dry.  Neurological:     General: No focal deficit present.     Mental Status: He is alert and oriented to person, place, and time.  Psychiatric:        Mood and Affect: Mood normal.        Behavior: Behavior normal.        Thought Content: Thought content normal.        Judgment: Judgment normal.      No results found for any visits on 04/05/23.    The 10-year ASCVD risk score (Arnett DK, et al., 2019) is: 15.7%    Assessment  & Plan:   Vastine was seen today for medical management of chronic issues and diabetes.  Diagnoses and all orders for this visit:  Type 2 diabetes mellitus with hyperglycemia, without long-term current use of insulin (HCC) A1c 6.8, at goal of <7. Medication changes today: none, continue current regimen. He is on an ACE/ARB and statin. Eye exam: upcoming. Foot exam: UTD. Urine micro: UTD. Diet and exercise.   Hypertension associated with type 2 diabetes mellitus (HCC) Will recheck BMP after increased spironolactone dosage. Peripheral edema improved. BP at goal.  -     BMP8+EGFR  Hyperlipidemia associated with type 2 diabetes mellitus (HCC) Last LDL at goal at 66.   Gastroesophageal reflux disease, unspecified whether esophagitis present Well controlled on current regimen.   Acquired hypothyroidism Well controlled on current regimen.   Hematuria, unspecified type Managed by urology. Will check UA today.    Return in about 4 months (around 08/05/2023) for chronic follow up.   The patient indicates understanding of these issues and agrees with the plan.  Gabriel Earing, FNP

## 2023-04-12 ENCOUNTER — Ambulatory Visit: Payer: 59 | Admitting: Orthopaedic Surgery

## 2023-04-14 ENCOUNTER — Other Ambulatory Visit: Payer: Self-pay | Admitting: Family Medicine

## 2023-04-14 DIAGNOSIS — I700 Atherosclerosis of aorta: Secondary | ICD-10-CM

## 2023-04-14 DIAGNOSIS — I10 Essential (primary) hypertension: Secondary | ICD-10-CM

## 2023-04-14 DIAGNOSIS — E782 Mixed hyperlipidemia: Secondary | ICD-10-CM

## 2023-04-14 DIAGNOSIS — E1165 Type 2 diabetes mellitus with hyperglycemia: Secondary | ICD-10-CM

## 2023-04-15 ENCOUNTER — Encounter: Payer: Self-pay | Admitting: Family Medicine

## 2023-04-17 ENCOUNTER — Encounter: Payer: Self-pay | Admitting: Orthopaedic Surgery

## 2023-04-17 ENCOUNTER — Other Ambulatory Visit (INDEPENDENT_AMBULATORY_CARE_PROVIDER_SITE_OTHER): Payer: 59

## 2023-04-17 ENCOUNTER — Ambulatory Visit: Payer: 59 | Admitting: Orthopaedic Surgery

## 2023-04-17 VITALS — Ht 69.5 in | Wt 319.6 lb

## 2023-04-17 DIAGNOSIS — M25551 Pain in right hip: Secondary | ICD-10-CM | POA: Diagnosis not present

## 2023-04-17 DIAGNOSIS — M87051 Idiopathic aseptic necrosis of right femur: Secondary | ICD-10-CM | POA: Diagnosis not present

## 2023-04-17 NOTE — Progress Notes (Signed)
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  He denies any hip pain at all and denies any groin pain.  He is someone who does have a BMI of 46.52.  He is a diabetic but his last hemoglobin A1c has come down to 6.8.  He is walking without assistive device.  He has been dealing with chronic right sided low back pain with sciatica.  On exam his right hip exam is entirely normal.  I can easily put his right hip through internal and external rotation and compression with no pain at all.  Standing AP pelvis and lateral of his right hip shows no evidence of femoral head collapse or irregularity.  This was a small nidus of AVN that was seen on the MRI.  At this point follow-up can be as needed for his right hip.  However he understands if he develops any groin pain or worsening symptoms he should come not hesitate to come back to see [place].

## 2023-04-18 ENCOUNTER — Other Ambulatory Visit: Payer: 59

## 2023-04-18 DIAGNOSIS — R319 Hematuria, unspecified: Secondary | ICD-10-CM | POA: Diagnosis not present

## 2023-04-18 LAB — URINALYSIS, ROUTINE W REFLEX MICROSCOPIC
Bilirubin, UA: NEGATIVE
Ketones, UA: NEGATIVE
Leukocytes,UA: NEGATIVE
Nitrite, UA: NEGATIVE
Protein,UA: NEGATIVE
Specific Gravity, UA: 1.015 (ref 1.005–1.030)
Urobilinogen, Ur: 0.2 mg/dL (ref 0.2–1.0)
pH, UA: 5.5 (ref 5.0–7.5)

## 2023-04-18 LAB — MICROSCOPIC EXAMINATION
Bacteria, UA: NONE SEEN
Epithelial Cells (non renal): NONE SEEN /hpf (ref 0–10)
RBC, Urine: NONE SEEN /hpf (ref 0–2)
Renal Epithel, UA: NONE SEEN /hpf
WBC, UA: NONE SEEN /hpf (ref 0–5)
Yeast, UA: NONE SEEN

## 2023-04-20 ENCOUNTER — Telehealth: Payer: Self-pay

## 2023-04-20 NOTE — Telephone Encounter (Signed)
Patient requested to cancel cysto apt, he will have CT imaging and follow up for results.  He states he did not want to have the procedure at this time. Apt canceled.

## 2023-04-21 ENCOUNTER — Encounter: Payer: Self-pay | Admitting: Family Medicine

## 2023-04-21 ENCOUNTER — Ambulatory Visit (INDEPENDENT_AMBULATORY_CARE_PROVIDER_SITE_OTHER): Payer: 59

## 2023-04-21 ENCOUNTER — Ambulatory Visit: Payer: 59 | Admitting: Family Medicine

## 2023-04-21 VITALS — BP 112/69 | HR 75 | Temp 97.4°F | Ht 69.5 in | Wt 322.0 lb

## 2023-04-21 DIAGNOSIS — G8929 Other chronic pain: Secondary | ICD-10-CM | POA: Diagnosis not present

## 2023-04-21 DIAGNOSIS — R1031 Right lower quadrant pain: Secondary | ICD-10-CM

## 2023-04-21 DIAGNOSIS — M4720 Other spondylosis with radiculopathy, site unspecified: Secondary | ICD-10-CM

## 2023-04-21 DIAGNOSIS — M545 Low back pain, unspecified: Secondary | ICD-10-CM | POA: Diagnosis not present

## 2023-04-21 MED ORDER — PREDNISONE 20 MG PO TABS: 40.0000 mg | ORAL_TABLET | Freq: Every day | ORAL | 0 refills | Status: AC

## 2023-04-21 MED ORDER — KETOROLAC TROMETHAMINE 60 MG/2ML IM SOLN
60.0000 mg | Freq: Once | INTRAMUSCULAR | Status: AC
Start: 2023-04-21 — End: 2023-04-21
  Administered 2023-04-21: 60 mg via INTRAMUSCULAR

## 2023-04-21 MED ORDER — METHYLPREDNISOLONE ACETATE 80 MG/ML IJ SUSP
80.0000 mg | Freq: Once | INTRAMUSCULAR | Status: AC
Start: 2023-04-21 — End: 2023-04-21
  Administered 2023-04-21: 80 mg via INTRAMUSCULAR

## 2023-04-21 MED ORDER — CYCLOBENZAPRINE HCL 10 MG PO TABS
10.0000 mg | ORAL_TABLET | Freq: Three times a day (TID) | ORAL | 0 refills | Status: DC | PRN
Start: 2023-04-21 — End: 2023-06-16

## 2023-04-21 NOTE — Progress Notes (Signed)
Acute Office Visit  Subjective:     Patient ID: Daniel Scott, male    DOB: 1961-01-25, 62 y.o.   MRN: [id number]  Chief Complaint  Patient presents with   Back Pain   Abdominal Pain    Back Pain This is a recurrent problem. The current episode started in the past 7 days. The problem occurs constantly. The problem has been gradually worsening since onset. The pain is present in the lumbar spine. The quality of the pain is described as aching (spasms in back). Radiates to: RLQ pain - achy pain. The pain is at a severity of 7/10. The symptoms are aggravated by standing, twisting, position and bending. Associated symptoms include abdominal pain. Pertinent negatives include no bladder incontinence, bowel incontinence, chest pain, dysuria, fever, headaches, leg pain, numbness, paresis, paresthesias, pelvic pain, perianal numbness, tingling, weakness or weight loss. He has tried analgesics, NSAIDs, heat and bed rest for the symptoms. The treatment provided mild relief.  Abdominal Pain This is a new problem. The onset quality is gradual. The problem occurs constantly. The problem has been gradually worsening. The pain is located in the RLQ. The pain is at a severity of 6/10. The quality of the pain is aching. The abdominal pain does not radiate. Pertinent negatives include no anorexia, constipation, diarrhea, dysuria, fever, frequency, headaches, hematochezia, hematuria, melena, nausea, vomiting or weight loss. The pain is aggravated by certain positions.   Previous lumbar MRI with lumbar spondylosis, DDD. Had UA checked earlier this week- WNL.  Right hip MRI with AVN. He has follow up closely with ortho for his hip. Per ortho note earlier this week, hip exam is normal and there are no significant concerns.   Review of Systems  Constitutional:  Negative for fever and weight loss.  Cardiovascular:  Negative for chest pain.  Gastrointestinal:  Positive for abdominal pain. Negative for anorexia,  bowel incontinence, constipation, diarrhea, hematochezia, melena, nausea and vomiting.  Genitourinary:  Negative for bladder incontinence, dysuria, frequency, hematuria and pelvic pain.  Musculoskeletal:  Positive for back pain.  Neurological:  Negative for tingling, weakness, numbness, headaches and paresthesias.        Objective:    BP 112/69   Pulse 75   Temp (!) 97.4 F (36.3 C) (Temporal)   Ht 5' 9.5" (1.765 m)   Wt (!) 322 lb (146.1 kg)   SpO2 96%   BMI 46.87 kg/m    Physical Exam Vitals and nursing note reviewed.  Constitutional:      General: He is not in acute distress.    Appearance: He is obese. He is not ill-appearing, toxic-appearing or diaphoretic.  Pulmonary:     Effort: Pulmonary effort is normal.  Abdominal:     General: Bowel sounds are normal. There is no distension.     Tenderness: There is no abdominal tenderness. There is no guarding or rebound.  Musculoskeletal:     Cervical back: Neck supple. No rigidity.     Thoracic back: No swelling, edema, signs of trauma, tenderness or bony tenderness.     Lumbar back: No swelling, edema, signs of trauma, tenderness or bony tenderness. Positive right straight leg raise test.     Right lower leg: No edema.     Left lower leg: No edema.  Skin:    General: Skin is warm and dry.  Neurological:     General: No focal deficit present.     Mental Status: He is alert and oriented to person, place, and time.  Psychiatric:        Mood and Affect: Mood normal.        Behavior: Behavior normal.     No results found for any visits on 04/21/23.      Assessment & Plan:   Daniel Scott was seen today for back pain and abdominal pain.  Diagnoses and all orders for this visit:  Acute right lower quadrant pain Thoracic and lumbar spondylosis on xray- no acute abdominal finding. Histroy consistent with thoracic/lumbar radiculopathy.  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 No red flag symptoms. Steroid and toradol IM injection today. Hold NSAIDs until tomorrow. Start prednisone burst tomorrow. Referral to ortho for further evaluation and management. Referral to PT placed. Flexeril prn for spasm. Strict return precautions.  -     methylPREDNISolone acetate (DEPO-MEDROL) injection 80 mg -     ketorolac (TORADOL) injection 60 mg -     predniSONE (DELTASONE) 20 MG tablet; Take 2 tablets (40 mg total) by mouth daily with breakfast for 5 days. Start tomorrow. -     cyclobenzaprine (FLEXERIL) 10 MG tablet; Take 1 tablet (10 mg total) by mouth 3 (three) times daily as needed for muscle spasms. -     Ambulatory referral to Orthopedic Surgery -     Ambulatory referral to Physical Therapy  Return to office for new or worsening symptoms, or if symptoms persist.   The patient indicates understanding of these issues and agrees with the plan.  Gabriel Earing, FNP

## 2023-04-22 ENCOUNTER — Encounter: Payer: Self-pay | Admitting: Cardiology

## 2023-04-22 ENCOUNTER — Other Ambulatory Visit: Payer: Self-pay | Admitting: Cardiology

## 2023-04-24 ENCOUNTER — Other Ambulatory Visit: Payer: Self-pay

## 2023-04-24 MED ORDER — METOPROLOL SUCCINATE ER 25 MG PO TB24: 25.0000 mg | ORAL_TABLET | Freq: Every day | ORAL | 3 refills | Status: DC

## 2023-04-25 ENCOUNTER — Ambulatory Visit: Payer: 59 | Admitting: Physical Therapy

## 2023-05-02 ENCOUNTER — Encounter: Payer: Self-pay | Admitting: *Deleted

## 2023-05-03 ENCOUNTER — Ambulatory Visit: Payer: 59 | Attending: Family Medicine | Admitting: Physical Therapy

## 2023-05-03 ENCOUNTER — Other Ambulatory Visit: Payer: Self-pay

## 2023-05-03 ENCOUNTER — Encounter: Payer: Self-pay | Admitting: Urology

## 2023-05-03 DIAGNOSIS — M4720 Other spondylosis with radiculopathy, site unspecified: Secondary | ICD-10-CM | POA: Insufficient documentation

## 2023-05-03 DIAGNOSIS — M5459 Other low back pain: Secondary | ICD-10-CM | POA: Diagnosis not present

## 2023-05-03 DIAGNOSIS — M62830 Muscle spasm of back: Secondary | ICD-10-CM | POA: Diagnosis not present

## 2023-05-03 NOTE — Therapy (Signed)
OUTPATIENT PHYSICAL THERAPY THORACOLUMBAR EVALUATION   Patient Name: Daniel Scott MRN: [id number] DOB:1960/09/27, 62 y.o., male Today's Date: 05/03/2023  END OF SESSION:  PT End of Session - 05/03/23 0952     Visit Number 1    Number of Visits 8    Date for PT Re-Evaluation 06/07/23    Authorization Type FOTO    PT Start Time 0845    PT Stop Time 0931    PT Time Calculation (min) 46 min    Activity Tolerance Patient tolerated treatment well    Behavior During Therapy WFL for tasks assessed/performed             Past Medical History:  Diagnosis Date   Anxiety    Arthritis    hands and feet   Back pain    DDD (degenerative disc disease), cervical    Depression    Diabetes mellitus without complication (HCC)    type 2   Edema of both lower extremities    Fatty liver    GERD (gastroesophageal reflux disease)    Hiatal hernia    Hyperlipidemia    Hypertension    Hypothyroidism    Joint pain    Morbid obesity (HCC)    Neuromuscular disorder (HCC)    stenosis   Palpitations    Pneumonia 1990   PONV (postoperative nausea and vomiting)    Right hip pain    Stenosis of cervical spine    Thyroid disease    Past Surgical History:  Procedure Laterality Date   ANTERIOR CERVICAL DECOMP/DISCECTOMY FUSION N/A 06/02/2020   Procedure: ANTERIOR CERVICAL DECOMPRESSION/DISCECTOMY FUSIONCERVICAL FOUR- CERVICAL FIVE, CERVICAL FIVE- CERVICAL SIX;  Surgeon: Dawley, Alan Mulder, DO;  Location: [facility] OR;  Service: Neurosurgery;  Laterality: N/A;  ANTERIOR CERVICAL DECOMPRESSION/DISCECTOMY FUSIONCERVICAL FOUR- CERVICAL FIVE, CERVICAL FIVE- CERVICAL SIX   CARDIAC CATHETERIZATION  1996; 2006   [facility]   COLONOSCOPY WITH PROPOFOL N/A 12/08/2022   Procedure: COLONOSCOPY WITH PROPOFOL;  Surgeon: Lanelle Bal, DO;  Location: [facility] ENDO SUITE;  Service: Endoscopy;  Laterality: N/A;  10:00 am , asa 3   HYDROCELE EXCISION     LAPAROSCOPIC GASTRIC BANDING     LAPAROSCOPIC  ROUX-EN-Y GASTRIC BYPASS WITH UPPER ENDOSCOPY AND REMOVAL OF LAP BAND  02/2012   LEFT HEART CATH AND CORONARY ANGIOGRAPHY N/A 04/21/2022   Procedure: LEFT HEART CATH AND CORONARY ANGIOGRAPHY;  Surgeon: Lyn Records, MD;  Location: [facility] INVASIVE CV LAB;  Service: Cardiovascular;  Laterality: N/A;   ligament replacement Right    right hand   POLYPECTOMY  12/08/2022   Procedure: POLYPECTOMY;  Surgeon: Lanelle Bal, DO;  Location: [facility];  Service: Endoscopy;;   TONSILLECTOMY     Patient Active Problem List   Diagnosis Date Noted   History of prostatitis 02/21/2023   BPH with obstruction/lower urinary tract symptoms 12/30/2022   Former heavy tobacco smoker 12/30/2022   Elevated liver enzymes 12/13/2022   LAP-BAND surgery status 11/30/2022   Other fatigue 11/30/2022   SOB (shortness of breath) on exertion 11/30/2022   Class 3 severe obesity with serious comorbidity and body mass index (BMI) of 45.0 to 49.9 in adult [facility]) 11/17/2022   Suspected sleep apnea 11/17/2022   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) 09/08/2022   NSVT (nonsustained ventricular tachycardia) (HCC) 09/08/2022   Primary osteoarthritis of right hip 06/07/2022   Chronic right hip pain 06/06/2022   Chronic midline low back pain  with bilateral sciatica 03/02/2022   Lumbar nerve root impingement 03/02/2022   Erectile dysfunction 03/02/2022   Aortic atherosclerosis (HCC) 08/03/2021   Acquired hypothyroidism 08/03/2021   DDD (degenerative disc disease), cervical 02/18/2021   Hypertension associated with type 2 diabetes mellitus (HCC) 06/13/2020   Type 2 diabetes mellitus with hyperglycemia, without long-term current use of insulin (HCC) 03/10/2020   Morbid obesity (HCC) 03/10/2020   Hyperlipidemia associated with type 2 diabetes mellitus (HCC)    GERD (gastroesophageal reflux disease)     REFERRING PROVIDER: Harlow Mares  REFERRING DIAG: 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  Rationale for Evaluation and Treatment: Rehabilitation  THERAPY DIAG:  Other low back pain  Muscle spasm of back  ONSET DATE: ~2 years.   SUBJECTIVE:                                                                                                                                                                                           SUBJECTIVE STATEMENT: The patient presents to the clinic with an approximate two years h/o low back pain.  Today, he is having a good day but has times when his pain is quite severe.  Nothing in particular can cause the flare-ups.  He has times just getting up in the morning with very high pain-levels.  He also experiences pain radiation into his abdominal and groin region and he is going to having an abdominal CT. He hasn't found anything really helps decrease his pain.  PERTINENT HISTORY:  Please see above.  PAIN:  Are you having pain? No  PRECAUTIONS: Other: .  RED FLAGS: None   WEIGHT BEARING RESTRICTIONS: No  FALLS:  Has patient fallen in last 6 months? No  LIVING ENVIRONMENT: Lives in: House/apartment Has following equipment at home: None  OCCUPATION: Retired.  PLOF: Independent  PATIENT GOALS: Not have low back pain/flare-ups.   OBJECTIVE:   DIAGNOSTIC FINDINGS:  X-ray: 10/27/21:  Diffuse multilevel degenerative change with multilevel disc degeneration, endplate osteophyte formation and facet hypertrophy. No acute abnormality identified.  MRI:  11/25/21:  IMPRESSION: 1. Please note that there is a partially segmental S1 vertebra but the lowest fully segmental non-rib-bearing vertebra is labeled L5. 2. Lumbar spondylosis, degenerative disc disease, and congenitally short pedicles contributing to moderate impingement at L5-S1; mild to moderate impingement at L3-4; and mild impingement at L4-5. 3. Large fluid signal intensity lesion below the left kidney is partially included on today's examination and is most  likely to be a left renal cyst, but not fully characterized.  PATIENT SURVEYS:  FOTO .  POSTURE: rounded shoulders and forward head  PALPATION: Tender to palpation over  right SIJ and upper gluteal region.  LUMBAR ROM:   Limited by 25% into active flexion and extension is 20 degrees.  LOWER EXTREMITY MMT:    Normal LE strength.    LUMBAR SPECIAL TESTS:  Right LE longer which appears to be due to a right anterior pelvic rotation.  Left Patellar and bilateral Achilles reflexes are diminished.     GAIT: WNL.  TODAY'S TREATMENT:                                                                                                                              DATE: HMP and IFC at 80-150 Hz on 40% scan x 20 minutes to patient's right lower lumbar region.   Normal modality response following removal of modality.   PATIENT EDUCATION:  Education details: Discussed progression into a core exercise progression.  Provided patient with information on a TENS unit. Person educated: Patient Education method: Explanation Education comprehension: verbalized understanding  HOME EXERCISE PROGRAM:   ASSESSMENT:  CLINICAL IMPRESSION: The patient presents to [facility] with c/o chronic LBP and frequent flare-ups that can be quite severe.  Today, his CC was palpable pain over his right SIJ and upper gluteal musculature.  His right LE is longer than left which appears due to a right anterior pelvic rotation.  He exhibits normal LE strength.  He has been having some abdominal symptoms and will be getting a CT scan to this region. His active lumbar flexion and  extension is quite good.  Patient will benefit from skilled physical therapy intervention to address pain and deficits.  OBJECTIVE IMPAIRMENTS: decreased activity tolerance, increased muscle spasms, and pain.   ACTIVITY LIMITATIONS:  Varies.  PARTICIPATION LIMITATIONS:  Varies depending on flare-ups.  PERSONAL FACTORS: Time since onset of  injury/illness/exacerbation are also affecting patient's functional outcome.   REHAB POTENTIAL: Good  CLINICAL DECISION MAKING: Stable/uncomplicated  EVALUATION COMPLEXITY: Low   GOALS:  SHORT TERM GOALS: Target date: 05/21/23  Ind with a HEP. Goal status: INITIAL  2.  Perform ADL's with pain not > 3/10.  Goal status: INITIAL  3.  Patient report no flare-ups of LBP. Baseline:  Goal status: INITIAL   PLAN:  PT FREQUENCY: 2x/week  PT DURATION: 4 weeks  PLANNED INTERVENTIONS: Therapeutic exercises, Therapeutic activity, Patient/Family education, Self Care, Dry Needling, Electrical stimulation, Cryotherapy, Moist heat, Ultrasound, and Manual therapy.  PLAN FOR NEXT SESSION: Right SKTC, Combo e'stim/US, STW/M, Core exercise progression, spinal protection techniques and body mechanics training.   Francesca Strome, [place], PT 05/03/2023, 12:58 PM

## 2023-05-05 ENCOUNTER — Encounter (HOSPITAL_COMMUNITY): Payer: Self-pay

## 2023-05-05 ENCOUNTER — Ambulatory Visit
Admission: RE | Admit: 2023-05-05 | Discharge: 2023-05-05 | Disposition: A | Discharge status: Home / Self Care | Payer: 59 | Source: Ambulatory Visit | Attending: Urology | Admitting: Urology

## 2023-05-05 ENCOUNTER — Ambulatory Visit (HOSPITAL_COMMUNITY): Payer: 59

## 2023-05-05 DIAGNOSIS — K760 Fatty (change of) liver, not elsewhere classified: Secondary | ICD-10-CM | POA: Diagnosis not present

## 2023-05-05 DIAGNOSIS — Z87891 Personal history of nicotine dependence: Secondary | ICD-10-CM | POA: Diagnosis not present

## 2023-05-05 DIAGNOSIS — R3129 Other microscopic hematuria: Secondary | ICD-10-CM | POA: Diagnosis not present

## 2023-05-05 MED ORDER — IOPAMIDOL (ISOVUE-300) INJECTION 61%
125.0000 mL | Freq: Once | INTRAVENOUS | Status: AC | PRN
  Administered 2023-05-05: 125 mL via INTRAVENOUS

## 2023-05-09 ENCOUNTER — Encounter: Payer: 59 | Admitting: *Deleted

## 2023-05-09 ENCOUNTER — Telehealth: Payer: Self-pay

## 2023-05-09 NOTE — Telephone Encounter (Signed)
Patient is made aware and voiced that once he has time in his scheduled he will scheduled the next available cysto. Patient voiced understanding

## 2023-05-09 NOTE — Telephone Encounter (Signed)
-----   Message from [place]nita [place] sent at 05/09/2023 10:48 AM EDT ----- Please let pt know that CT show no findings to account for the hematuria. He is advised to follow up for cystoscopy as previously planned; that was canceled for 05/10/2023 and needs to be rescheduled.

## 2023-05-10 ENCOUNTER — Other Ambulatory Visit: Payer: 59 | Admitting: Urology

## 2023-05-12 DIAGNOSIS — M542 Cervicalgia: Secondary | ICD-10-CM | POA: Diagnosis not present

## 2023-05-12 DIAGNOSIS — M5451 Vertebrogenic low back pain: Secondary | ICD-10-CM | POA: Diagnosis not present

## 2023-06-09 DIAGNOSIS — Z7984 Long term (current) use of oral hypoglycemic drugs: Secondary | ICD-10-CM | POA: Diagnosis not present

## 2023-06-09 DIAGNOSIS — H2513 Age-related nuclear cataract, bilateral: Secondary | ICD-10-CM | POA: Diagnosis not present

## 2023-06-09 DIAGNOSIS — E119 Type 2 diabetes mellitus without complications: Secondary | ICD-10-CM | POA: Diagnosis not present

## 2023-06-09 DIAGNOSIS — Z794 Long term (current) use of insulin: Secondary | ICD-10-CM | POA: Diagnosis not present

## 2023-06-09 LAB — HM DIABETES EYE EXAM

## 2023-06-10 ENCOUNTER — Other Ambulatory Visit: Payer: Self-pay | Admitting: Family Medicine

## 2023-06-10 DIAGNOSIS — E1165 Type 2 diabetes mellitus with hyperglycemia: Secondary | ICD-10-CM

## 2023-06-10 DIAGNOSIS — E039 Hypothyroidism, unspecified: Secondary | ICD-10-CM

## 2023-06-16 ENCOUNTER — Ambulatory Visit: Payer: 59 | Admitting: Family Medicine

## 2023-06-16 ENCOUNTER — Encounter: Payer: Self-pay | Admitting: Family Medicine

## 2023-06-16 ENCOUNTER — Telehealth: Payer: Self-pay | Admitting: Family Medicine

## 2023-06-16 VITALS — BP 113/85 | HR 102 | Temp 97.2°F | Ht 69.5 in | Wt 316.4 lb

## 2023-06-16 DIAGNOSIS — K5792 Diverticulitis of intestine, part unspecified, without perforation or abscess without bleeding: Secondary | ICD-10-CM

## 2023-06-16 MED ORDER — AMOXICILLIN-POT CLAVULANATE 875-125 MG PO TABS
1.0000 | ORAL_TABLET | Freq: Two times a day (BID) | ORAL | 0 refills | Status: AC
Start: 2023-06-16 — End: 2023-06-23

## 2023-06-16 NOTE — Patient Instructions (Signed)
Diverticulitis  Diverticulitis happens when poop (stool) and bacteria get trapped in small pouches in the colon called diverticula. These pouches may form if you have a condition called diverticulosis. When the poop and bacteria get trapped, it can cause an infection and inflammation. Diverticulitis may cause severe stomach pain and diarrhea. It can also lead to tissue damage in your colon. This can cause bleeding or blockage. In some cases, the diverticula may burst (rupture). This can cause infected poop to go into other parts of your abdomen. What are the causes? This condition is caused by poop getting trapped in the diverticula. This allows bacteria to grow. It can lead to inflammation and infection. What increases the risk? You are more likely to get this condition if you have diverticulosis. You are also more at risk if: You are overweight or obese. You do not get enough exercise. You drink alcohol. You smoke. You eat a lot of red meat, such as beef, pork, or lamb. You do not get enough fiber. Foods high in fiber include fruits, vegetables, beans, nuts, and whole grains. You are over 35 years of age. What are the signs or symptoms? Symptoms of this condition may include: Pain and tenderness in the abdomen. This pain is often felt on the left side but may occur in other spots. Fever and chills. Nausea and vomiting. Cramping. Bloating. Changes in how often you poop. Blood in your poop. How is this diagnosed? This condition is diagnosed based on your medical history and a physical exam. You may also have tests done to make sure there is nothing else causing your condition. These tests may include: Blood tests. Tests done on your pee (urine). A CT scan of the abdomen. You may need to have a colonoscopy. This is an exam to look at your whole large intestine. During the exam, a tube is put into the opening of your butt (anus) and then moved into your rectum, colon, and other parts of  the large intestine. This exam is done to look at the diverticula. It can also see if there is something else that may be causing your symptoms. How is this treated? Most cases are mild and can be treated at home. You may be told to: Take over-the-counter pain medicine. Only eat and drink clear liquids. Take antibiotics. Rest. More severe cases may need to be treated at a hospital. Treatment may include: Not eating or drinking. Taking pain medicines. Getting antibiotics through an IV. Getting fluids and nutrition through an IV. Surgery. Follow these instructions at home: Medicines Take over-the-counter and prescription medicines only as told by your health care provider. These include fiber supplements, probiotics, and medicines to soften your poop (stool softeners). If you were prescribed antibiotics, take them as told by your provider. Do not stop using the antibiotic even if you start to feel better. Ask your provider if the medicine prescribed to you requires you to avoid driving or using machinery. Eating and drinking  Follow the diet told by your provider. You may need to only eat and drink liquids. After your symptoms get better, you may be able to return to a more normal diet. You may be told to eat at least 25 grams (25 g) of fiber each day. Fiber makes it easier to poop. Healthy sources of fiber include: Berries. One cup has 4-8 g of fiber. Beans or lentils. One-half cup has 5-8 g of fiber. Green vegetables. One cup has 4 g of fiber. Avoid eating red meat.  General instructions Do not use any products that contain nicotine or tobacco. These products include cigarettes, chewing tobacco, and vaping devices, such as e-cigarettes. If you need help quitting, ask your provider. Exercise for at least 30 minutes, 3 times a week. Exercise hard enough to raise your heart rate and break a sweat. Contact a health care provider if: Your pain gets worse. Your pooping does not go back to  normal. Your symptoms do not get better with treatment. Your symptoms get worse all of a sudden. You have a fever. You vomit more than one time. Your poop is bloody, black, or tarry. This information is not intended to replace advice given to you by your health care provider. Make sure you discuss any questions you have with your health care provider. Document Revised: 05/19/2022 Document Reviewed: 05/19/2022 Elsevier Patient Education  2024 [employer].

## 2023-06-16 NOTE — Progress Notes (Signed)
Acute Office Visit  Subjective:     Patient ID: Daniel Scott, male    DOB: 1960-12-08, 62 y.o.   MRN: [id number]  Chief Complaint  Patient presents with   Diarrhea   Nausea    Diarrhea  This is a new problem. Episode onset: 2-3 weeks. The problem occurs 2 to 4 times per day. The problem has been gradually worsening. The stool consistency is described as Watery. Associated symptoms include abdominal pain (lower cramping), bloating and chills. Pertinent negatives include no fever, increased  flatus or vomiting. Associated symptoms comments: Nausea- mild. Nothing aggravates the symptoms. He has tried nothing for the symptoms. There is no history of inflammatory bowel disease or irritable bowel syndrome. lap band surgery in 2013   Recent abdominal CT with "Cholelithiasis, without associated inflammatory changes. Mild left colonic diverticulosis, without evidence of Diverticulitis"  ROS As per HPI.       Objective:    BP 113/85   Pulse (!) 102   Temp (!) 97.2 F (36.2 C) (Temporal)   Ht 5' 9.5" (1.765 m)   Wt (!) 316 lb 6 oz (143.5 kg)   SpO2 98%   BMI 46.05 kg/m    Physical Exam Vitals and nursing note reviewed.  Constitutional:      General: He is not in acute distress.    Appearance: He is obese. He is not ill-appearing, toxic-appearing or diaphoretic.  Eyes:     General: No scleral icterus. Pulmonary:     Effort: Pulmonary effort is normal. No respiratory distress.  Abdominal:     General: Bowel sounds are normal. There is no distension.     Palpations: Abdomen is soft.     Tenderness: There is abdominal tenderness in the right lower quadrant and left lower quadrant. There is no guarding or rebound.  Musculoskeletal:     Cervical back: Neck supple. No rigidity.     Right lower leg: No edema.     Left lower leg: No edema.  Skin:    General: Skin is warm and dry.     Coloration: Skin is not jaundiced.  Neurological:     General: No focal deficit present.      Mental Status: He is alert and oriented to person, place, and time.  Psychiatric:        Mood and Affect: Mood normal.        Behavior: Behavior normal.     No results found for any visits on 06/16/23.      Assessment & Plan:   Emigdio was seen today for diarrhea and nausea.  Diagnoses and all orders for this visit:  Diverticulitis History consistent with diverticulitis. Augmentin as below. Discussed liquid diet, hydration. Discussed when to seek emergency care. Return to office for new or worsening symptoms, or if symptoms persist.  -     amoxicillin-clavulanate (AUGMENTIN) 875-125 MG tablet; Take 1 tablet by mouth 2 (two) times daily for 7 days.  The patient indicates understanding of these issues and agrees with the plan.   Gabriel Earing, FNP

## 2023-07-31 ENCOUNTER — Encounter: Payer: Self-pay | Admitting: Cardiology

## 2023-07-31 ENCOUNTER — Encounter: Payer: Self-pay | Admitting: Family Medicine

## 2023-08-07 ENCOUNTER — Encounter: Payer: Self-pay | Admitting: Family Medicine

## 2023-08-07 ENCOUNTER — Ambulatory Visit: Payer: 59 | Admitting: Family Medicine

## 2023-08-07 VITALS — BP 110/74 | HR 67 | Temp 97.7°F | Ht 69.5 in | Wt 325.4 lb

## 2023-08-07 DIAGNOSIS — E1169 Type 2 diabetes mellitus with other specified complication: Secondary | ICD-10-CM | POA: Diagnosis not present

## 2023-08-07 DIAGNOSIS — E785 Hyperlipidemia, unspecified: Secondary | ICD-10-CM

## 2023-08-07 DIAGNOSIS — M542 Cervicalgia: Secondary | ICD-10-CM | POA: Diagnosis not present

## 2023-08-07 DIAGNOSIS — F339 Major depressive disorder, recurrent, unspecified: Secondary | ICD-10-CM

## 2023-08-07 DIAGNOSIS — R600 Localized edema: Secondary | ICD-10-CM

## 2023-08-07 DIAGNOSIS — E1165 Type 2 diabetes mellitus with hyperglycemia: Secondary | ICD-10-CM | POA: Diagnosis not present

## 2023-08-07 DIAGNOSIS — E1159 Type 2 diabetes mellitus with other circulatory complications: Secondary | ICD-10-CM

## 2023-08-07 DIAGNOSIS — I700 Atherosclerosis of aorta: Secondary | ICD-10-CM

## 2023-08-07 DIAGNOSIS — K219 Gastro-esophageal reflux disease without esophagitis: Secondary | ICD-10-CM

## 2023-08-07 DIAGNOSIS — E039 Hypothyroidism, unspecified: Secondary | ICD-10-CM

## 2023-08-07 DIAGNOSIS — I152 Hypertension secondary to endocrine disorders: Secondary | ICD-10-CM | POA: Diagnosis not present

## 2023-08-07 DIAGNOSIS — G8929 Other chronic pain: Secondary | ICD-10-CM | POA: Insufficient documentation

## 2023-08-07 LAB — BAYER DCA HB A1C WAIVED: HB A1C (BAYER DCA - WAIVED): 6.7 % — ABNORMAL HIGH (ref 4.8–5.6)

## 2023-08-07 MED ORDER — MELOXICAM 15 MG PO TABS
15.0000 mg | ORAL_TABLET | Freq: Every day | ORAL | 0 refills | Status: DC
Start: 2023-08-07 — End: 2023-09-04

## 2023-08-07 MED ORDER — METHOCARBAMOL 500 MG PO TABS
500.0000 mg | ORAL_TABLET | Freq: Four times a day (QID) | ORAL | 1 refills | Status: DC | PRN
Start: 2023-08-07 — End: 2023-09-18

## 2023-08-07 NOTE — Progress Notes (Signed)
Established Patient Office Visit  Subjective   Patient ID: Daniel Scott, male    DOB: [birth year]  Age: 62 y.o. MRN: [id number]  Chief Complaint  Patient presents with   Medical Management of Chronic Issues   Diabetes    HPI T2DM Pt presents for evaluation of Type 2 diabetes mellitus. Patient denies foot ulcerations, increased appetite, nausea, polydipsia, polyuria, visual disturbances, vomiting, and weight loss.  Current diabetic medications include metformin, trulicity, jardiance Compliant with meds - Yes  Current monitoring regimen: no  Eye exam current (within one year): yes, will request records Current diet: regular, working on portion control Current exercise:  limited due to chronic hip pain  Urine microalbumin UTD? Yes Is He on ACE inhibitor or angiotensin II receptor blocker?  Yes, lisinopril Is He on statin? Yes atorvastatin Is He on ASA 81 mg daily?  Yes  2. HTN Complaint with meds - Yes Current Medications - amlodipine 10 mg, lisinopril 20 mg, spironlactone 50 mg, metoprolol Checking BP at home - no Pertinent ROS:  Headache - No Fatigue - No Visual Disturbances - No Chest pain - No Dyspnea - No Palpitations - No LE edema - No  3. HLD On atorvastatin 80 mg.   4. GERD On omeprazole. Reports well controlled. Denies nausea, vomiting, dysphagia, water brash. Hx of lap band surgery 11 years ago.   5. Hypothyroid On levothyroxine 75 mcg. Compliant with medication. No symptoms.   6. Neck pain Midline and bilateral neck pain for 6 months as well as midline. Pain is constant on sides. Midline pain is intermittent. Pain improves with rest, worsens with activity. Reports a catching sometimes with movement, rotation. Denies numbness, tinging, changes in bowel or bladder control. He has tried voltaren gel, biofreeze, tens unit without relief. Hx of ACDF 2021 with [facility]. No injury. [facility] did an xray of his neck about 3 months ago. Reports that  there was no acute findings.      08/07/2023    8:14 AM 06/16/2023    1:42 PM 04/21/2023    9:37 AM  Depression screen PHQ 2/9  Decreased Interest 1 1 1   Down, Depressed, Hopeless 1 1 1   PHQ - 2 Score 2 2 2   Altered sleeping 2 1 1   Tired, decreased energy 1 2 1   Change in appetite 0 1 0  Feeling bad or failure about yourself  1 1 0  Trouble concentrating 0 0 1  Moving slowly or fidgety/restless 0 0 0  Suicidal thoughts 0 0 0  PHQ-9 Score 6 7 5   Difficult doing work/chores Somewhat difficult Somewhat difficult Somewhat difficult      08/07/2023    8:15 AM 04/21/2023    9:39 AM 04/05/2023    8:17 AM 03/15/2023    4:02 PM  GAD 7 : Generalized Anxiety Score  Nervous, Anxious, on Edge 1 0 0 1  Control/stop worrying 1 1 0 1  Worry too much - different things 1 1 0 1  Trouble relaxing 1 1 0 1  Restless 0 0 0 0  Easily annoyed or irritable 2 1 0 1  Afraid - awful might happen 1 1 0 1  Total GAD 7 Score 7 5 0 6  Anxiety Difficulty Somewhat difficult Somewhat difficult Not difficult at all Somewhat difficult         Past Medical History:  Diagnosis Date   Anxiety    Arthritis    hands and feet   Back pain  DDD (degenerative disc disease), cervical    Depression    Diabetes mellitus without complication (HCC)    type 2   Edema of both lower extremities    Fatty liver    GERD (gastroesophageal reflux disease)    Hiatal hernia    Hyperlipidemia    Hypertension    Hypothyroidism    Joint pain    Morbid obesity (HCC)    Neuromuscular disorder (HCC)    stenosis   Palpitations    Pneumonia 1990   PONV (postoperative nausea and vomiting)    Right hip pain    Stenosis of cervical spine    Thyroid disease       ROS As per HPI.    Objective:     BP 110/74   Pulse 67   Temp 97.7 F (36.5 C) (Temporal)   Ht 5' 9.5" (1.765 m)   Wt (!) 325 lb 6 oz (147.6 kg)   SpO2 98%   BMI 47.36 kg/m  Wt Readings from Last 3 Encounters:  08/07/23 (!) 325 lb 6 oz (147.6  kg)  06/16/23 (!) 316 lb 6 oz (143.5 kg)  04/21/23 (!) 322 lb (146.1 kg)     Physical Exam Vitals and nursing note reviewed.  Constitutional:      General: He is not in acute distress.    Appearance: He is obese. He is not ill-appearing, toxic-appearing or diaphoretic.  HENT:     Head: Normocephalic and atraumatic.  Cardiovascular:     Rate and Rhythm: Normal rate and regular rhythm.     Heart sounds: Normal heart sounds. No murmur heard. Pulmonary:     Effort: Pulmonary effort is normal. No respiratory distress.     Breath sounds: Normal breath sounds.  Abdominal:     General: Bowel sounds are normal.     Palpations: Abdomen is soft.  Musculoskeletal:     Cervical back: Neck supple. No erythema or rigidity. Muscular tenderness (bilateral) present. No pain with movement or spinous process tenderness. Normal range of motion.     Right lower leg: No edema.     Left lower leg: No edema.  Skin:    General: Skin is warm and dry.  Neurological:     General: No focal deficit present.     Mental Status: He is alert and oriented to person, place, and time.  Psychiatric:        Mood and Affect: Mood normal.        Behavior: Behavior normal.        Thought Content: Thought content normal.        Judgment: Judgment normal.      No results found for any visits on 08/07/23.    The 10-year ASCVD risk score (Arnett DK, et al., 2019) is: 15.9%    Assessment & Plan:   Daniel Scott was seen today for medical management of chronic issues and diabetes.  Diagnoses and all orders for this visit:  Type 2 diabetes mellitus with hyperglycemia, without long-term current use of insulin (HCC) A1c 6.7 today, at goal of <7. Medication changes today: continue current regimen. He is on an ACE/ARB and statin. Eye exam: . Foot exam: today. Urine micro: UTD. Diet and exercise.  -     Bayer DCA Hb A1c Waived -     Vitamin B12 -     BMP8+EGFR  Hypertension associated with type 2 diabetes mellitus  (HCC) Well controlled on current regimen.   Hyperlipidemia associated with type 2 diabetes  mellitus (HCC) Recent LDL at 66. On statin.   Peripheral edema Euvolemic on exam today. Potassium level pending as he is on spironolactone in combination with ACE.   Acquired hypothyroidism Has been well controlled.   Morbid obesity (HCC) Weight has been trending up since last visit. Diet, exercise as able. S/P lab band.   Aortic atherosclerosis (HCC) On statin.   Gastroesophageal reflux disease, unspecified whether esophagitis present Well controlled on current regimen.   Chronic neck pain [facility] has done Xray recently with no acute finding. Try mobic daily prn, do not take other NSAIDs with mobic. Try robaxin prn. Discussed PT referral as next step. Aware of red flag symptoms.  -     meloxicam (MOBIC) 15 MG tablet; Take 1 tablet (15 mg total) by mouth daily. -     methocarbamol (ROBAXIN) 500 MG tablet; Take 1 tablet (500 mg total) by mouth every 6 (six) hours as needed for muscle spasms.  Depression Declines treatment today. Denies SI.   Return in about 4 months (around 12/06/2023) for CPE/chronic follow up.   The patient indicates understanding of these issues and agrees with the plan.  Gabriel Earing, FNP

## 2023-08-08 ENCOUNTER — Encounter: Payer: Self-pay | Admitting: Family Medicine

## 2023-08-08 LAB — BMP8+EGFR
BUN/Creatinine Ratio: 18 (ref 10–24)
BUN: 12 mg/dL (ref 8–27)
CO2: 21 mmol/L (ref 20–29)
Calcium: 9.5 mg/dL (ref 8.6–10.2)
Chloride: 101 mmol/L (ref 96–106)
Creatinine, Ser: 0.68 mg/dL — ABNORMAL LOW (ref 0.76–1.27)
Glucose: 135 mg/dL — ABNORMAL HIGH (ref 70–99)
Potassium: 5.4 mmol/L — ABNORMAL HIGH (ref 3.5–5.2)
Sodium: 138 mmol/L (ref 134–144)
eGFR: 104 mL/min/{1.73_m2} (ref >59)

## 2023-08-08 LAB — VITAMIN B12: Vitamin B-12: 555 pg/mL (ref 232–1245)

## 2023-08-09 ENCOUNTER — Other Ambulatory Visit: Payer: Self-pay

## 2023-08-09 DIAGNOSIS — E1165 Type 2 diabetes mellitus with hyperglycemia: Secondary | ICD-10-CM

## 2023-08-16 ENCOUNTER — Other Ambulatory Visit: Payer: 59

## 2023-08-16 DIAGNOSIS — E1165 Type 2 diabetes mellitus with hyperglycemia: Secondary | ICD-10-CM

## 2023-08-16 LAB — BMP8+EGFR
BUN/Creatinine Ratio: 17 (ref 10–24)
BUN: 14 mg/dL (ref 8–27)
CO2: 21 mmol/L (ref 20–29)
Calcium: 9.6 mg/dL (ref 8.6–10.2)
Chloride: 103 mmol/L (ref 96–106)
Creatinine, Ser: 0.82 mg/dL (ref 0.76–1.27)
Glucose: 166 mg/dL — ABNORMAL HIGH (ref 70–99)
Potassium: 5.1 mmol/L (ref 3.5–5.2)
Sodium: 138 mmol/L (ref 134–144)
eGFR: 98 mL/min/{1.73_m2} (ref >59)

## 2023-09-04 ENCOUNTER — Other Ambulatory Visit: Payer: Self-pay | Admitting: Family Medicine

## 2023-09-04 DIAGNOSIS — G8929 Other chronic pain: Secondary | ICD-10-CM

## 2023-09-15 ENCOUNTER — Other Ambulatory Visit: Payer: Self-pay | Admitting: Family Medicine

## 2023-09-15 DIAGNOSIS — G8929 Other chronic pain: Secondary | ICD-10-CM

## 2023-09-29 ENCOUNTER — Other Ambulatory Visit (HOSPITAL_COMMUNITY): Payer: Self-pay

## 2023-09-29 ENCOUNTER — Encounter: Payer: Self-pay | Admitting: Family Medicine

## 2023-09-29 ENCOUNTER — Telehealth: Payer: Self-pay

## 2023-09-29 MED ORDER — TRULICITY 4.5 MG/0.5ML ~~LOC~~ SOAJ: 4.5000 mg | SUBCUTANEOUS | 3 refills | Status: DC

## 2023-09-29 NOTE — Telephone Encounter (Signed)
Pharmacy Patient Advocate Encounter   Received notification from Patient Advice Request messages that prior authorization for Trulicity 4.5MG /0.5ML auto-injectors is required/requested.   Insurance verification completed.   The patient is insured through [facility] .   Per test claim: PA required; PA submitted to above mentioned insurance via CoverMyMeds Key/confirmation #/EOC [id number] Status is pending

## 2023-10-02 ENCOUNTER — Other Ambulatory Visit (HOSPITAL_COMMUNITY): Payer: Self-pay

## 2023-10-02 NOTE — Telephone Encounter (Signed)
Pharmacy Patient Advocate Encounter  Received notification from [facility] that Prior Authorization for Trulicity 4.5MG /0.5ML auto-injectors has been APPROVED from 09/29/23 to 09/28/24. Unable to obtain price due to refill too soon rejection, last fill date 09/29/23 next available fill date 10/20/23   PA #/Case ID/Reference #:  [id number]

## 2023-10-08 ENCOUNTER — Other Ambulatory Visit: Payer: Self-pay | Admitting: Family Medicine

## 2023-10-08 DIAGNOSIS — E1165 Type 2 diabetes mellitus with hyperglycemia: Secondary | ICD-10-CM

## 2023-10-19 ENCOUNTER — Encounter: Payer: Self-pay | Admitting: Family Medicine

## 2023-10-23 ENCOUNTER — Ambulatory Visit: Payer: BC Managed Care – PPO | Admitting: Family Medicine

## 2023-10-23 ENCOUNTER — Encounter: Payer: Self-pay | Admitting: Family Medicine

## 2023-10-23 VITALS — BP 109/70 | HR 63 | Temp 98.2°F | Ht 69.5 in | Wt 332.6 lb

## 2023-10-23 DIAGNOSIS — K219 Gastro-esophageal reflux disease without esophagitis: Secondary | ICD-10-CM

## 2023-10-23 DIAGNOSIS — Z7985 Long-term (current) use of injectable non-insulin antidiabetic drugs: Secondary | ICD-10-CM

## 2023-10-23 DIAGNOSIS — E1165 Type 2 diabetes mellitus with hyperglycemia: Secondary | ICD-10-CM | POA: Diagnosis not present

## 2023-10-23 DIAGNOSIS — K8020 Calculus of gallbladder without cholecystitis without obstruction: Secondary | ICD-10-CM

## 2023-10-23 DIAGNOSIS — R110 Nausea: Secondary | ICD-10-CM

## 2023-10-23 DIAGNOSIS — R748 Abnormal levels of other serum enzymes: Secondary | ICD-10-CM

## 2023-10-23 MED ORDER — FAMOTIDINE 20 MG PO TABS: 20.0000 mg | ORAL_TABLET | Freq: Two times a day (BID) | ORAL | 2 refills | Status: DC

## 2023-10-23 NOTE — Progress Notes (Unsigned)
Acute Office Visit  Subjective:     Patient ID: Daniel Scott, male    DOB: 01-06-1961, 63 y.o.   MRN: [id number]  Chief Complaint  Patient presents with   Nausea    HPI Patient is in today for nausea for 6-8 weeks. Unchanged. Occurs most days. Generally lasts for a few minutes to an hour at a time. Occurs multiple times throughout the day. Can be really intense at times. Has had occasional vomiting- 2x over the 6-8 weeks. Both times occurred on an empty stomach first thing in the morning. Hasn't noticed a trigger or pattern. No heartburn, pain, changes in bowel habits, dysphagia. Sometimes has water brash. He does take omeprazole 20 mg daily for GERD. Hx of gallstones, mild hepatic steatosis on CT. Hx of lap band surgery.   Takes trulicity for T2DM. Has been on this for many months. Was on ozempic prior but had to switch to trulicity due to insurance. Interested in switching to [facility] as this was be more cost effective for him.   ROS As per HPI.      Objective:    BP 109/70   Pulse 63   Temp 98.2 F (36.8 C) (Temporal)   Ht 5' 9.5" (1.765 m)   Wt (!) 332 lb 9.6 oz (150.9 kg)   SpO2 96%   BMI 48.41 kg/m  Wt Readings from Last 3 Encounters:  10/23/23 (!) 332 lb 9.6 oz (150.9 kg)  08/07/23 (!) 325 lb 6 oz (147.6 kg)  06/16/23 (!) 316 lb 6 oz (143.5 kg)      Physical Exam Vitals and nursing note reviewed.  Constitutional:      General: He is not in acute distress.    Appearance: He is obese. He is not ill-appearing, toxic-appearing or diaphoretic.  Eyes:     General: No scleral icterus.    Pupils: Pupils are equal, round, and reactive to light.  Cardiovascular:     Heart sounds: Normal heart sounds. No murmur heard. Pulmonary:     Effort: Pulmonary effort is normal.     Breath sounds: Normal breath sounds.  Abdominal:     General: Bowel sounds are normal.     Palpations: Abdomen is soft. There is no mass.     Tenderness: There is no abdominal tenderness. There  is no guarding or rebound. Negative signs include Murphy's sign and McBurney's sign.  Musculoskeletal:     Right lower leg: No edema.     Left lower leg: No edema.  Skin:    General: Skin is warm and dry.     Coloration: Skin is not jaundiced.  Neurological:     General: No focal deficit present.     Mental Status: He is alert and oriented to person, place, and time.  Psychiatric:        Mood and Affect: Mood normal.        Behavior: Behavior normal.     No results found for any visits on 10/23/23.      Assessment & Plan:   Pritesh was seen today for nausea.  Diagnoses and all orders for this visit:  Nausea -     CMP14+EGFR -     CBC with Differential/Platelet -     Lipase -     famotidine (PEPCID) 20 MG tablet; Take 1 tablet (20 mg total) by mouth 2 (two) times daily.  Gastroesophageal reflux disease, unspecified whether esophagitis present  Gallstones  Type 2 diabetes mellitus with hyperglycemia, without  long-term current use of insulin (HCC)  Benign exam. No weight loss. Labs pending as above. Will add Pepcid BID to see if nausea improves with this. Discussed switching to mounjaro if covered by insurance as nausea may be a side effect of Trulicity. Has been on ozempic prior. He will contact her insurance to discuss. Discussed imaging if abnormalities on labs, worsening of symptoms or if symptoms persist.    Return to office for new or worsening symptoms, or if symptoms persist.   The patient indicates understanding of these issues and agrees with the plan.  Gabriel Earing, FNP

## 2023-10-24 LAB — CMP14+EGFR
ALT: 62 [IU]/L — ABNORMAL HIGH (ref 0–44)
AST: 39 [IU]/L (ref 0–40)
Albumin: 4.4 g/dL (ref 3.9–4.9)
Alkaline Phosphatase: 73 [IU]/L (ref 44–121)
BUN/Creatinine Ratio: 15 (ref 10–24)
BUN: 14 mg/dL (ref 8–27)
Bilirubin Total: 0.4 mg/dL (ref 0.0–1.2)
CO2: 21 mmol/L (ref 20–29)
Calcium: 9 mg/dL (ref 8.6–10.2)
Chloride: 103 mmol/L (ref 96–106)
Creatinine, Ser: 0.96 mg/dL (ref 0.76–1.27)
Globulin, Total: 1.9 g/dL (ref 1.5–4.5)
Glucose: 132 mg/dL — ABNORMAL HIGH (ref 70–99)
Potassium: 5 mmol/L (ref 3.5–5.2)
Sodium: 139 mmol/L (ref 134–144)
Total Protein: 6.3 g/dL (ref 6.0–8.5)
eGFR: 88 mL/min/{1.73_m2} (ref >59)

## 2023-10-24 LAB — CBC WITH DIFFERENTIAL/PLATELET
Basophils Absolute: 0 10*3/uL (ref 0.0–0.2)
Basos: 1 %
EOS (ABSOLUTE): 0.2 10*3/uL (ref 0.0–0.4)
Eos: 3 %
Hematocrit: 42.2 % (ref 37.5–51.0)
Hemoglobin: 14.5 g/dL (ref 13.0–17.7)
Immature Grans (Abs): 0 10*3/uL (ref 0.0–0.1)
Immature Granulocytes: 0 %
Lymphocytes Absolute: 1.3 10*3/uL (ref 0.7–3.1)
Lymphs: 22 %
MCH: 33.2 pg — ABNORMAL HIGH (ref 26.6–33.0)
MCHC: 34.4 g/dL (ref 31.5–35.7)
MCV: 97 fL (ref 79–97)
Monocytes Absolute: 0.6 10*3/uL (ref 0.1–0.9)
Monocytes: 10 %
Neutrophils Absolute: 3.9 10*3/uL (ref 1.4–7.0)
Neutrophils: 64 %
Platelets: 273 10*3/uL (ref 150–450)
RBC: 4.37 x10E6/uL (ref 4.14–5.80)
RDW: 12.2 % (ref 11.6–15.4)
WBC: 6 10*3/uL (ref 3.4–10.8)

## 2023-10-24 LAB — LIPASE: Lipase: 59 U/L (ref 13–78)

## 2023-10-25 MED ORDER — TIRZEPATIDE 2.5 MG/0.5ML ~~LOC~~ SOAJ: 2.5000 mg | SUBCUTANEOUS | 6 refills | Status: DC

## 2023-10-26 ENCOUNTER — Encounter: Payer: Self-pay | Admitting: Family Medicine

## 2023-10-27 ENCOUNTER — Telehealth: Payer: Self-pay

## 2023-10-27 ENCOUNTER — Other Ambulatory Visit (HOSPITAL_COMMUNITY): Payer: Self-pay

## 2023-10-27 NOTE — Telephone Encounter (Signed)
Pharmacy Patient Advocate Encounter   Received notification from  [facility] Portal that prior authorization for [facility] 2.5MG /0.5ML auto-injectors is required/requested.   Insurance verification completed.   The patient is insured through [facility] .   Per test claim: PA required; PA submitted to above mentioned insurance via CoverMyMeds Key/confirmation #/EOC [id number] Status is pending

## 2023-10-30 ENCOUNTER — Other Ambulatory Visit (HOSPITAL_COMMUNITY): Payer: Self-pay

## 2023-10-30 NOTE — Telephone Encounter (Signed)
 Pharmacy Patient Advocate Encounter  Received notification from [facility] that Prior Authorization for  Mounjaro 2.5MG /0.5ML auto-injectors  has been APPROVED from 10/27/23 to 10/26/24   PA #/Case ID/Reference #: [id number]

## 2023-11-06 MED ORDER — OMEPRAZOLE 40 MG PO CPDR
40.0000 mg | DELAYED_RELEASE_CAPSULE | Freq: Every day | ORAL | 3 refills | Status: DC
Start: 2023-11-06 — End: 2024-03-07

## 2023-11-06 NOTE — Addendum Note (Signed)
 Addended by: Gabriel Earing on: 11/06/2023 12:04 PM   Modules accepted: Orders

## 2023-11-12 ENCOUNTER — Other Ambulatory Visit: Payer: Self-pay | Admitting: Family Medicine

## 2023-11-12 DIAGNOSIS — E1165 Type 2 diabetes mellitus with hyperglycemia: Secondary | ICD-10-CM

## 2023-11-12 DIAGNOSIS — I10 Essential (primary) hypertension: Secondary | ICD-10-CM

## 2023-11-20 ENCOUNTER — Ambulatory Visit (HOSPITAL_COMMUNITY)
Admission: RE | Admit: 2023-11-20 | Discharge: 2023-11-20 | Disposition: A | Discharge status: Home / Self Care | Payer: Self-pay | Source: Ambulatory Visit | Attending: Family Medicine | Admitting: Family Medicine

## 2023-11-20 DIAGNOSIS — K8020 Calculus of gallbladder without cholecystitis without obstruction: Secondary | ICD-10-CM | POA: Diagnosis not present

## 2023-11-20 DIAGNOSIS — R748 Abnormal levels of other serum enzymes: Secondary | ICD-10-CM | POA: Insufficient documentation

## 2023-11-20 DIAGNOSIS — R110 Nausea: Secondary | ICD-10-CM | POA: Insufficient documentation

## 2023-11-24 ENCOUNTER — Encounter: Payer: Self-pay | Admitting: Family Medicine

## 2023-12-06 ENCOUNTER — Encounter: Payer: Self-pay | Admitting: Family Medicine

## 2023-12-06 ENCOUNTER — Ambulatory Visit: Payer: 59 | Admitting: Family Medicine

## 2023-12-06 VITALS — BP 104/72 | HR 58 | Temp 98.0°F | Ht 69.5 in | Wt 333.2 lb

## 2023-12-06 DIAGNOSIS — E1159 Type 2 diabetes mellitus with other circulatory complications: Secondary | ICD-10-CM | POA: Diagnosis not present

## 2023-12-06 DIAGNOSIS — I152 Hypertension secondary to endocrine disorders: Secondary | ICD-10-CM

## 2023-12-06 DIAGNOSIS — E1169 Type 2 diabetes mellitus with other specified complication: Secondary | ICD-10-CM

## 2023-12-06 DIAGNOSIS — G8929 Other chronic pain: Secondary | ICD-10-CM

## 2023-12-06 DIAGNOSIS — N401 Enlarged prostate with lower urinary tract symptoms: Secondary | ICD-10-CM | POA: Diagnosis not present

## 2023-12-06 DIAGNOSIS — K8020 Calculus of gallbladder without cholecystitis without obstruction: Secondary | ICD-10-CM

## 2023-12-06 DIAGNOSIS — E039 Hypothyroidism, unspecified: Secondary | ICD-10-CM

## 2023-12-06 DIAGNOSIS — E1165 Type 2 diabetes mellitus with hyperglycemia: Secondary | ICD-10-CM

## 2023-12-06 DIAGNOSIS — R110 Nausea: Secondary | ICD-10-CM

## 2023-12-06 DIAGNOSIS — E785 Hyperlipidemia, unspecified: Secondary | ICD-10-CM

## 2023-12-06 DIAGNOSIS — Z0001 Encounter for general adult medical examination with abnormal findings: Secondary | ICD-10-CM | POA: Diagnosis not present

## 2023-12-06 DIAGNOSIS — M542 Cervicalgia: Secondary | ICD-10-CM

## 2023-12-06 DIAGNOSIS — Z0000 Encounter for general adult medical examination without abnormal findings: Secondary | ICD-10-CM

## 2023-12-06 DIAGNOSIS — N529 Male erectile dysfunction, unspecified: Secondary | ICD-10-CM

## 2023-12-06 LAB — BAYER DCA HB A1C WAIVED: HB A1C (BAYER DCA - WAIVED): 7 % — ABNORMAL HIGH (ref 4.8–5.6)

## 2023-12-06 LAB — LIPID PANEL

## 2023-12-06 MED ORDER — METFORMIN HCL ER 500 MG PO TB24: 1000.0000 mg | ORAL_TABLET | Freq: Two times a day (BID) | ORAL | 3 refills | Status: DC

## 2023-12-06 MED ORDER — SPIRONOLACTONE 50 MG PO TABS: 50.0000 mg | ORAL_TABLET | Freq: Every day | ORAL | 3 refills | Status: DC

## 2023-12-06 MED ORDER — TIRZEPATIDE 5 MG/0.5ML ~~LOC~~ SOAJ: 5.0000 mg | SUBCUTANEOUS | 3 refills | Status: DC

## 2023-12-06 MED ORDER — METHOCARBAMOL 500 MG PO TABS: 500.0000 mg | ORAL_TABLET | Freq: Four times a day (QID) | ORAL | 1 refills | Status: DC | PRN

## 2023-12-06 MED ORDER — METOPROLOL SUCCINATE ER 25 MG PO TB24: 25.0000 mg | ORAL_TABLET | Freq: Every day | ORAL | 3 refills | Status: DC

## 2023-12-06 NOTE — Patient Instructions (Signed)
 Health Maintenance, Male  Adopting a healthy lifestyle and getting preventive care are important in promoting health and wellness. Ask your health care provider about:  The right schedule for you to have regular tests and exams.  Things you can do on your own to prevent diseases and keep yourself healthy.  What should I know about diet, weight, and exercise?  Eat a healthy diet    Eat a diet that includes plenty of vegetables, fruits, low-fat dairy products, and lean protein.  Do not eat a lot of foods that are high in solid fats, added sugars, or sodium.  Maintain a healthy weight  Body mass index (BMI) is a measurement that can be used to identify possible weight problems. It estimates body fat based on height and weight. Your health care provider can help determine your BMI and help you achieve or maintain a healthy weight.  Get regular exercise  Get regular exercise. This is one of the most important things you can do for your health. Most adults should:  Exercise for at least 150 minutes each week. The exercise should increase your heart rate and make you sweat (moderate-intensity exercise).  Do strengthening exercises at least twice a week. This is in addition to the moderate-intensity exercise.  Spend less time sitting. Even light physical activity can be beneficial.  Watch cholesterol and blood lipids  Have your blood tested for lipids and cholesterol at 65 years of age, then have this test every 5 years.  You may need to have your cholesterol levels checked more often if:  Your lipid or cholesterol levels are high.  You are older than 65 years of age.  You are at high risk for heart disease.  What should I know about cancer screening?  Many types of cancers can be detected early and may often be prevented. Depending on your health history and family history, you may need to have cancer screening at various ages. This may include screening for:  Colorectal cancer.  Prostate cancer.  Skin cancer.  Lung  cancer.  What should I know about heart disease, diabetes, and high blood pressure?  Blood pressure and heart disease  High blood pressure causes heart disease and increases the risk of stroke. This is more likely to develop in people who have high blood pressure readings or are overweight.  Talk with your health care provider about your target blood pressure readings.  Have your blood pressure checked:  Every 3-5 years if you are 9-95 years of age.  Every year if you are 85 years old or older.  If you are between the ages of 29 and 29 and are a current or former smoker, ask your health care provider if you should have a one-time screening for abdominal aortic aneurysm (AAA).  Diabetes  Have regular diabetes screenings. This checks your fasting blood sugar level. Have the screening done:  Once every three years after age 23 if you are at a normal weight and have a low risk for diabetes.  More often and at a younger age if you are overweight or have a high risk for diabetes.  What should I know about preventing infection?  Hepatitis B  If you have a higher risk for hepatitis B, you should be screened for this virus. Talk with your health care provider to find out if you are at risk for hepatitis B infection.  Hepatitis C  Blood testing is recommended for:  Everyone born from [birth year] through 1967.  Anyone  with known risk factors for hepatitis C.  Sexually transmitted infections (STIs)  You should be screened each year for STIs, including gonorrhea and chlamydia, if:  You are sexually active and are younger than 65 years of age.  You are older than 65 years of age and your health care provider tells you that you are at risk for this type of infection.  Your sexual activity has changed since you were last screened, and you are at increased risk for chlamydia or gonorrhea. Ask your health care provider if you are at risk.  Ask your health care provider about whether you are at high risk for HIV. Your health care provider  may recommend a prescription medicine to help prevent HIV infection. If you choose to take medicine to prevent HIV, you should first get tested for HIV. You should then be tested every 3 months for as long as you are taking the medicine.  Follow these instructions at home:  Alcohol use  Do not drink alcohol if your health care provider tells you not to drink.  If you drink alcohol:  Limit how much you have to 0-2 drinks a day.  Know how much alcohol is in your drink. In the [place] one drink equals one 12 oz bottle of beer (355 mL), one 5 oz glass of wine (148 mL), or one 1 oz glass of hard liquor (44 mL).  Lifestyle  Do not use any products that contain nicotine or tobacco. These products include cigarettes, chewing tobacco, and vaping devices, such as e-cigarettes. If you need help quitting, ask your health care provider.  Do not use street drugs.  Do not share needles.  Ask your health care provider for help if you need support or information about quitting drugs.  General instructions  Schedule regular health, dental, and eye exams.  Stay current with your vaccines.  Tell your health care provider if:  You often feel depressed.  You have ever been abused or do not feel safe at home.  Summary  Adopting a healthy lifestyle and getting preventive care are important in promoting health and wellness.  Follow your health care provider's instructions about healthy diet, exercising, and getting tested or screened for diseases.  Follow your health care provider's instructions on monitoring your cholesterol and blood pressure.  This information is not intended to replace advice given to you by your health care provider. Make sure you discuss any questions you have with your health care provider.  Document Revised: 01/11/2021 Document Reviewed: 01/11/2021  Elsevier Patient Education  2024 [employer].

## 2023-12-06 NOTE — Progress Notes (Signed)
 Complete physical exam  Patient: Daniel Scott   DOB: Jun 27, 1961   63 y.o. Male  MRN: [id number]  Subjective:    Chief Complaint  Patient presents with   Annual Exam    Daniel Scott is a 63 y.o. male who presents today for a complete physical exam. He reports consuming a general diet. The patient does not participate in regular exercise at present. He generally feels fairly well. He reports sleeping fairly well. He does have additional problems to discuss today.   Nausea has improved some. Has some periods where it is really intense 2-3x a week, usually worse in the morning. No abdominal pain, fever. No recent vomiting, though this does occur occasionally and suddenly.   Feels off balance when moving quickly sometimes. No dizziness, or lightheadedness. Lasts for just a few seconds. Started 6-8 weeks ago. Has been getting better.   Plans to call ortho for follow up regarding left hip pain.  Most recent fall risk assessment:    10/23/2023    8:44 AM  Fall Risk   Falls in the past year? 1  Number falls in past yr: 0  Injury with Fall? 0  Risk for fall due to : History of fall(s)  Follow up Education provided     Most recent depression screenings:    10/23/2023    8:44 AM 08/07/2023    8:14 AM  PHQ 2/9 Scores  PHQ - 2 Score 3 2  PHQ- 9 Score 7 6    Vision:Within last year and Dental: No current dental problems and Receives regular dental care  Past Medical History:  Diagnosis Date   Anxiety    Arthritis    hands and feet   Back pain    DDD (degenerative disc disease), cervical    Depression    Diabetes mellitus without complication (HCC)    type 2   Edema of both lower extremities    Fatty liver    GERD (gastroesophageal reflux disease)    Hiatal hernia    Hyperlipidemia    Hypertension    Hypothyroidism    Joint pain    Morbid obesity (HCC)    Neuromuscular disorder (HCC)    stenosis   Palpitations    Pneumonia 1990   PONV (postoperative nausea and  vomiting)    Right hip pain    Stenosis of cervical spine    Thyroid disease       Patient Care Team: Gabriel Earing, FNP as PCP - General (Family Medicine) Thomasene Ripple, DO as PCP - Cardiology (Cardiology) Smitty Cords, OD (Optometry)   Outpatient Medications Prior to Visit  Medication Sig   amLODipine (NORVASC) 10 MG tablet TAKE 1 TABLET BY MOUTH EVERY DAY   APPLE CIDER VINEGAR PO Take 450 mg by mouth 2 (two) times daily.   aspirin 325 MG EC tablet Take 325 mg by mouth daily.   atorvastatin (LIPITOR) 80 MG tablet TAKE 1 TABLET BY MOUTH EVERY DAY   Cholecalciferol (DIALYVITE VITAMIN D 5000) 125 MCG (5000 UT) capsule Take 5,000 Units by mouth daily.   CINNAMON PO Take 1,000 mg by mouth 2 (two) times daily.   clotrimazole-betamethasone (LOTRISONE) cream Apply 1 Application topically daily.   empagliflozin (JARDIANCE) 25 MG TABS tablet TAKE 1 TABLET BY MOUTH EVERY DAY BEFORE BREAKFAST   famotidine (PEPCID) 20 MG tablet Take 1 tablet (20 mg total) by mouth 2 (two) times daily.   Garlic 1000 MG CAPS Take 1,000 mg by mouth  2 (two) times daily.   Krill Oil 500 MG CAPS Take 500 mg by mouth daily.   levothyroxine (SYNTHROID) 75 MCG tablet TAKE 1 TABLET BY MOUTH EVERY DAY BEFORE BREAKFAST   lisinopril (ZESTRIL) 20 MG tablet TAKE 1 TABLET BY MOUTH EVERY DAY   meloxicam (MOBIC) 15 MG tablet TAKE 1 TABLET (15 MG TOTAL) BY MOUTH DAILY.   metFORMIN (GLUCOPHAGE-XR) 500 MG 24 hr tablet Take 2 tablets (1,000 mg total) by mouth 2 (two) times daily with a meal.   methocarbamol (ROBAXIN) 500 MG tablet TAKE 1 TABLET BY MOUTH EVERY 6 HOURS AS NEEDED FOR MUSCLE SPASMS.   metoprolol succinate (TOPROL XL) 25 MG 24 hr tablet Take 1 tablet (25 mg total) by mouth daily.   Multiple Vitamin (MULTIVITAMIN ADULT PO) Take 1 tablet by mouth daily.    nitroGLYCERIN (NITROSTAT) 0.4 MG SL tablet PLACE 1 TABLET UNDER THE TONGUE EVERY 5 MINUTES AS NEEDED FOR CHEST PAIN.   olopatadine (PATANOL) 0.1 % ophthalmic  solution Place 1 drop into both eyes daily as needed for allergies.   omeprazole (PRILOSEC) 40 MG capsule Take 1 capsule (40 mg total) by mouth daily.   psyllium (HYDROCIL/METAMUCIL) 95 % PACK Take 1 packet by mouth daily.   spironolactone (ALDACTONE) 50 MG tablet Take 1 tablet (50 mg total) by mouth daily.   tirzepatide [facility]) 2.5 MG/0.5ML Pen Inject 2.5 mg into the skin once a week.   Turmeric (QC TUMERIC COMPLEX PO) Take 1 capsule by mouth in the morning and at bedtime.   No facility-administered medications prior to visit.    ROS Negative unless specially indicated above in HPI.     Objective:     BP 104/72   Pulse (!) 58   Temp 98 F (36.7 C) (Temporal)   Ht 5' 9.5" (1.765 m)   Wt (!) 333 lb 3.2 oz (151.1 kg)   SpO2 97%   BMI 48.50 kg/m    Physical Exam Vitals and nursing note reviewed.  Constitutional:      General: He is not in acute distress.    Appearance: He is obese. He is not ill-appearing, toxic-appearing or diaphoretic.  HENT:     Head: Normocephalic and atraumatic.     Right Ear: Tympanic membrane, ear canal and external ear normal.     Left Ear: Tympanic membrane, ear canal and external ear normal.     Nose: Nose normal.     Mouth/Throat:     Mouth: Mucous membranes are moist.     Pharynx: Oropharynx is clear.  Eyes:     General: No scleral icterus.    Extraocular Movements: Extraocular movements intact.     Conjunctiva/sclera: Conjunctivae normal.     Pupils: Pupils are equal, round, and reactive to light.  Neck:     Thyroid: No thyroid mass, thyromegaly or thyroid tenderness.  Cardiovascular:     Rate and Rhythm: Normal rate and regular rhythm.     Pulses: Normal pulses.     Heart sounds: Normal heart sounds. No murmur heard.    No friction rub. No gallop.  Pulmonary:     Effort: Pulmonary effort is normal.     Breath sounds: Normal breath sounds.  Abdominal:     General: Bowel sounds are normal. There is no distension.     Palpations:  Abdomen is soft. There is no mass.     Tenderness: There is no abdominal tenderness. There is no guarding or rebound.  Musculoskeletal:     Cervical  back: Normal range of motion and neck supple. No tenderness.     Right lower leg: No edema.     Left lower leg: No edema.  Skin:    General: Skin is warm and dry.     Capillary Refill: Capillary refill takes less than 2 seconds.     Findings: No lesion or rash.  Neurological:     General: No focal deficit present.     Mental Status: He is alert and oriented to person, place, and time.  Psychiatric:        Mood and Affect: Mood normal.        Behavior: Behavior normal.        Thought Content: Thought content normal.      No results found for any visits on 12/06/23.     Assessment & Plan:    Routine Health Maintenance and Physical Exam  Daniel Scott was seen today for annual exam.  Diagnoses and all orders for this visit:  Routine general medical examination at a health care facility  Type 2 diabetes mellitus with hyperglycemia, without long-term current use of insulin (HCC) A1c 7.0, not at goal of <7. Increase mounjaro to 5 mg weekly.  -     Bayer DCA Hb A1c Waived -     Microalbumin / creatinine urine ratio -     metFORMIN (GLUCOPHAGE-XR) 500 MG 24 hr tablet; Take 2 tablets (1,000 mg total) by mouth 2 (two) times daily with a meal. -     tirzepatide (MOUNJARO) 5 MG/0.5ML Pen; Inject 5 mg into the skin once a week.  Hypertension associated with type 2 diabetes mellitus (HCC) BP at goal.  -     CBC with Differential/Platelet -     CMP14+EGFR -     metoprolol succinate (TOPROL XL) 25 MG 24 hr tablet; Take 1 tablet (25 mg total) by mouth daily. -     spironolactone (ALDACTONE) 50 MG tablet; Take 1 tablet (50 mg total) by mouth daily.  Hyperlipidemia associated with type 2 diabetes mellitus (HCC) -     Lipid panel  Acquired hypothyroidism -     TSH -     T4, free  Erectile dysfunction, unspecified erectile dysfunction type -      Testosterone,Free and Total  Benign prostatic hyperplasia with lower urinary tract symptoms, symptom details unspecified -     PSA, total and free  Chronic neck pain -     methocarbamol (ROBAXIN) 500 MG tablet; Take 1 tablet (500 mg total) by mouth every 6 (six) hours as needed for muscle spasms.  Gallstones Nausea Recent RUQ with multiple small stones. Having daily nasuea. Referral to general surgery discussed and placed.  -     Ambulatory referral to General Surgery    Immunization History  Administered Date(s) Administered   Fluad Trivalent(High Dose 65+) 05/25/2023   Influenza Split 07/21/2004   Influenza,inj,Quad PF,6+ Mos 06/11/2020, 06/21/2021   Influenza-Unspecified 06/11/2020, 06/21/2021, 05/30/2022   Moderna SARS-COV2 Booster Vaccination 02/23/2021   Moderna Sars-Covid-2 Vaccination 11/09/2019, 12/11/2019, 12/11/2019, 08/05/2020, 02/23/2021   Pfizer Covid-19 Vaccine Bivalent Booster 75yrs & up 05/25/2023   Pneumococcal Polysaccharide-23 03/25/2021   Tdap 05/06/2014   Zoster Recombinant(Shingrix) 01/04/2022    Health Maintenance  Topic Date Due   Lung Cancer Screening  11/03/2021   Diabetic kidney evaluation - Urine ACR  11/30/2023   Zoster Vaccines- Shingrix (2 of 2) 03/30/2024 (Originally 03/01/2022)   COVID-19 Vaccine (6 - 2024-25 season) 08/22/2024 (Originally 07/20/2023)   Pneumococcal Vaccine  30-34 Years old (2 of 2 - PCV) 12/05/2024 (Originally 03/25/2022)   HEMOGLOBIN A1C  02/05/2024   INFLUENZA VACCINE  04/05/2024   DTaP/Tdap/Td (2 - Td or Tdap) 05/06/2024   OPHTHALMOLOGY EXAM  06/08/2024   FOOT EXAM  08/06/2024   Diabetic kidney evaluation - eGFR measurement  10/22/2024   Colonoscopy  12/08/2027   Hepatitis C Screening  Completed   HIV Screening  Completed   HPV VACCINES  Aged Out    Discussed health benefits of physical activity, and encouraged him to engage in regular exercise appropriate for his age and condition.  Problem List Items Addressed  This Visit       Cardiovascular and Mediastinum   Hypertension associated with type 2 diabetes mellitus (HCC) (Chronic)   Relevant Medications   metFORMIN (GLUCOPHAGE-XR) 500 MG 24 hr tablet   metoprolol succinate (TOPROL XL) 25 MG 24 hr tablet   spironolactone (ALDACTONE) 50 MG tablet   tirzepatide (MOUNJARO) 5 MG/0.5ML Pen   Other Relevant Orders   CBC with Differential/Platelet   CMP14+EGFR     Endocrine   Type 2 diabetes mellitus with hyperglycemia, without long-term current use of insulin (HCC) (Chronic)   Relevant Medications   metFORMIN (GLUCOPHAGE-XR) 500 MG 24 hr tablet   tirzepatide (MOUNJARO) 5 MG/0.5ML Pen   Other Relevant Orders   Bayer DCA Hb A1c Waived   Microalbumin / creatinine urine ratio   Hyperlipidemia associated with type 2 diabetes mellitus (HCC) (Chronic)   Relevant Medications   metFORMIN (GLUCOPHAGE-XR) 500 MG 24 hr tablet   metoprolol succinate (TOPROL XL) 25 MG 24 hr tablet   spironolactone (ALDACTONE) 50 MG tablet   tirzepatide (MOUNJARO) 5 MG/0.5ML Pen   Other Relevant Orders   Lipid panel   Acquired hypothyroidism   Relevant Medications   metoprolol succinate (TOPROL XL) 25 MG 24 hr tablet   Other Relevant Orders   TSH   T4, free     Other   Erectile dysfunction   Relevant Orders   Testosterone,Free and Total   Chronic neck pain   Relevant Medications   methocarbamol (ROBAXIN) 500 MG tablet   Other Visit Diagnoses       Routine general medical examination at a health care facility    -  Primary     Benign prostatic hyperplasia with lower urinary tract symptoms, symptom details unspecified       Relevant Orders   PSA, total and free     Gallstones       Relevant Orders   Ambulatory referral to General Surgery     Nausea       Relevant Orders   Ambulatory referral to General Surgery      Return in about 3 months (around 03/06/2024) for chronic follow up.   The patient indicates understanding of these issues and agrees with the  plan.  Gabriel Earing, FNP

## 2023-12-07 ENCOUNTER — Encounter: Payer: Self-pay | Admitting: Family Medicine

## 2023-12-07 LAB — MICROALBUMIN / CREATININE URINE RATIO
Creatinine, Urine: 60.5 mg/dL
Microalb/Creat Ratio: <5 mg/g{creat} (ref 0–29)
Microalbumin, Urine: <3 ug/mL

## 2023-12-08 ENCOUNTER — Encounter: Payer: Self-pay | Admitting: Family Medicine

## 2023-12-11 LAB — LIPID PANEL
Chol/HDL Ratio: 3 ratio (ref 0.0–5.0)
Cholesterol, Total: 140 mg/dL (ref 100–199)
HDL: 46 mg/dL (ref >39)
LDL Chol Calc (NIH): 71 mg/dL (ref 0–99)
Triglycerides: 130 mg/dL (ref 0–149)
VLDL Cholesterol Cal: 23 mg/dL (ref 5–40)

## 2023-12-11 LAB — TESTOSTERONE,FREE AND TOTAL
Testosterone, Free: 8 pg/mL (ref 6.6–18.1)
Testosterone: 447 ng/dL (ref 264–916)

## 2023-12-11 LAB — CMP14+EGFR
ALT: 43 IU/L (ref 0–44)
AST: 34 IU/L (ref 0–40)
Albumin: 4.5 g/dL (ref 3.9–4.9)
Alkaline Phosphatase: 74 IU/L (ref 44–121)
BUN/Creatinine Ratio: 21 (ref 10–24)
BUN: 18 mg/dL (ref 8–27)
Bilirubin Total: 0.5 mg/dL (ref 0.0–1.2)
CO2: 22 mmol/L (ref 20–29)
Calcium: 9.7 mg/dL (ref 8.6–10.2)
Chloride: 101 mmol/L (ref 96–106)
Creatinine, Ser: 0.85 mg/dL (ref 0.76–1.27)
Globulin, Total: 1.9 g/dL (ref 1.5–4.5)
Glucose: 149 mg/dL — ABNORMAL HIGH (ref 70–99)
Potassium: 5.1 mmol/L (ref 3.5–5.2)
Sodium: 139 mmol/L (ref 134–144)
Total Protein: 6.4 g/dL (ref 6.0–8.5)
eGFR: 97 mL/min/{1.73_m2} (ref >59)

## 2023-12-11 LAB — CBC WITH DIFFERENTIAL/PLATELET
Basophils Absolute: 0 10*3/uL (ref 0.0–0.2)
Basos: 1 %
EOS (ABSOLUTE): 0.1 10*3/uL (ref 0.0–0.4)
Eos: 2 %
Hematocrit: 43.9 % (ref 37.5–51.0)
Hemoglobin: 14.9 g/dL (ref 13.0–17.7)
Immature Grans (Abs): 0 10*3/uL (ref 0.0–0.1)
Immature Granulocytes: 0 %
Lymphocytes Absolute: 1.2 10*3/uL (ref 0.7–3.1)
Lymphs: 22 %
MCH: 31.9 pg (ref 26.6–33.0)
MCHC: 33.9 g/dL (ref 31.5–35.7)
MCV: 94 fL (ref 79–97)
Monocytes Absolute: 0.6 10*3/uL (ref 0.1–0.9)
Monocytes: 10 %
Neutrophils Absolute: 3.5 10*3/uL (ref 1.4–7.0)
Neutrophils: 65 %
Platelets: 243 10*3/uL (ref 150–450)
RBC: 4.67 x10E6/uL (ref 4.14–5.80)
RDW: 12.4 % (ref 11.6–15.4)
WBC: 5.5 10*3/uL (ref 3.4–10.8)

## 2023-12-11 LAB — TSH: TSH: 1.87 u[IU]/mL (ref 0.450–4.500)

## 2023-12-11 LAB — T4, FREE: Free T4: 1.34 ng/dL (ref 0.82–1.77)

## 2023-12-11 LAB — PSA, TOTAL AND FREE
PSA, Free Pct: 27.1 %
PSA, Free: 1.41 ng/mL
Prostate Specific Ag, Serum: 5.2 ng/mL — ABNORMAL HIGH (ref 0.0–4.0)

## 2023-12-12 ENCOUNTER — Encounter: Payer: Self-pay | Admitting: Family Medicine

## 2024-01-01 ENCOUNTER — Ambulatory Visit: Admitting: Urology

## 2024-01-01 VITALS — BP 130/75 | HR 67

## 2024-01-01 DIAGNOSIS — N401 Enlarged prostate with lower urinary tract symptoms: Secondary | ICD-10-CM

## 2024-01-01 DIAGNOSIS — R3129 Other microscopic hematuria: Secondary | ICD-10-CM | POA: Diagnosis not present

## 2024-01-01 DIAGNOSIS — R351 Nocturia: Secondary | ICD-10-CM | POA: Diagnosis not present

## 2024-01-01 DIAGNOSIS — N138 Other obstructive and reflux uropathy: Secondary | ICD-10-CM

## 2024-01-01 DIAGNOSIS — R972 Elevated prostate specific antigen [PSA]: Secondary | ICD-10-CM | POA: Diagnosis not present

## 2024-01-01 LAB — URINALYSIS, ROUTINE W REFLEX MICROSCOPIC
Bilirubin, UA: NEGATIVE
Ketones, UA: NEGATIVE
Leukocytes,UA: NEGATIVE
Nitrite, UA: NEGATIVE
Protein,UA: NEGATIVE
Specific Gravity, UA: <1.005 — ABNORMAL LOW (ref 1.005–1.030)
Urobilinogen, Ur: 0.2 mg/dL (ref 0.2–1.0)
pH, UA: 6 (ref 5.0–7.5)

## 2024-01-01 LAB — MICROSCOPIC EXAMINATION
Bacteria, UA: NONE SEEN
WBC, UA: NONE SEEN /HPF (ref 0–5)

## 2024-01-01 NOTE — Progress Notes (Signed)
 01/01/2024 9:49 AM   Daniel Scott 1961/05/24 [id number]  Referring provider: Albertha Huger, FNP [address] [place],  [place] [address]  Elevated PSA   HPI: Mr Zettle is a 62yo here for evaluation of elevated PSA. PSA has been 2.6-3.1 and PSA on 12/06/23 was 5.2. IPSS 17 QOL 3 on no BPH therapy. He was sen in 7/24 in our office for prostatitis and treated with flomax  and antibiotics.    PMH: Past Medical History:  Diagnosis Date   Anxiety    Arthritis    hands and feet   Back pain    DDD (degenerative disc disease), cervical    Depression    Diabetes mellitus without complication (HCC)    type 2   Edema of both lower extremities    Fatty liver    GERD (gastroesophageal reflux disease)    Hiatal hernia    Hyperlipidemia    Hypertension    Hypothyroidism    Joint pain    Morbid obesity (HCC)    Neuromuscular disorder (HCC)    stenosis   Palpitations    Pneumonia 1990   PONV (postoperative nausea and vomiting)    Right hip pain    Stenosis of cervical spine    Thyroid  disease     Surgical History: Past Surgical History:  Procedure Laterality Date   ANTERIOR CERVICAL DECOMP/DISCECTOMY FUSION N/A 06/02/2020   Procedure: ANTERIOR CERVICAL DECOMPRESSION/DISCECTOMY FUSIONCERVICAL FOUR- CERVICAL FIVE, CERVICAL FIVE- CERVICAL SIX;  Surgeon: Dawley, Colby Daub, DO;  [facility];  Service: Neurosurgery;  Laterality: N/A;  ANTERIOR CERVICAL DECOMPRESSION/DISCECTOMY FUSIONCERVICAL FOUR- CERVICAL FIVE, CERVICAL FIVE- CERVICAL SIX   CARDIAC CATHETERIZATION  1996; 2006   [facility]   COLONOSCOPY WITH PROPOFOL  N/A 12/08/2022   Procedure: COLONOSCOPY WITH PROPOFOL ;  Surgeon: Vinetta Greening, DO;  [facility];  Service: Endoscopy;  Laterality: N/A;  10:00 am , asa 3   HYDROCELE EXCISION     LAPAROSCOPIC GASTRIC BANDING     LAPAROSCOPIC ROUX-EN-Y GASTRIC BYPASS WITH UPPER ENDOSCOPY AND REMOVAL OF LAP BAND  02/2012   LEFT HEART CATH AND CORONARY  ANGIOGRAPHY N/A 04/21/2022   Procedure: LEFT HEART CATH AND CORONARY ANGIOGRAPHY;  Surgeon: Arty Binning, MD;  [facility];  Service: Cardiovascular;  Laterality: N/A;   ligament replacement Right    right hand   POLYPECTOMY  12/08/2022   Procedure: POLYPECTOMY;  Surgeon: Vinetta Greening, DO;  [facility];  Service: Endoscopy;;   TONSILLECTOMY      Home Medications:  Allergies as of 01/01/2024       Reactions   Sulfa Antibiotics Rash        Medication List        Accurate as of January 01, 2024  9:49 AM. If you have any questions, ask your nurse or doctor.          amLODipine  10 MG tablet Commonly known as: NORVASC  TAKE 1 TABLET BY MOUTH EVERY DAY   APPLE CIDER VINEGAR PO Take 450 mg by mouth 2 (two) times daily.   aspirin  EC 325 MG tablet Take 325 mg by mouth daily.   atorvastatin  80 MG tablet Commonly known as: LIPITOR  TAKE 1 TABLET BY MOUTH EVERY DAY   CINNAMON PO Take 1,000 mg by mouth 2 (two) times daily.   clotrimazole -betamethasone  cream Commonly known as: LOTRISONE  Apply 1 Application topically daily.   Dialyvite Vitamin D  5000 125 MCG (5000 UT) capsule Generic  drug: Cholecalciferol Take 5,000 Units by mouth daily.   famotidine  20 MG tablet Commonly known as: Pepcid  Take 1 tablet (20 mg total) by mouth 2 (two) times daily.   Garlic 1000 MG Caps Take 1,000 mg by mouth 2 (two) times daily.   Jardiance  25 MG Tabs tablet Generic drug: empagliflozin  TAKE 1 TABLET BY MOUTH EVERY DAY BEFORE BREAKFAST   Krill Oil 500 MG Caps Take 500 mg by mouth daily.   levothyroxine  75 MCG tablet Commonly known as: SYNTHROID  TAKE 1 TABLET BY MOUTH EVERY DAY BEFORE BREAKFAST   lisinopril  20 MG tablet Commonly known as: ZESTRIL  TAKE 1 TABLET BY MOUTH EVERY DAY   meloxicam  15 MG tablet Commonly known as: MOBIC  TAKE 1 TABLET (15 MG TOTAL) BY MOUTH DAILY.   metFORMIN  500 MG 24 hr tablet Commonly known as: GLUCOPHAGE -XR Take 2  tablets (1,000 mg total) by mouth 2 (two) times daily with a meal.   methocarbamol  500 MG tablet Commonly known as: ROBAXIN  Take 1 tablet (500 mg total) by mouth every 6 (six) hours as needed for muscle spasms.   metoprolol  succinate 25 MG 24 hr tablet Commonly known as: Toprol  XL Take 1 tablet (25 mg total) by mouth daily.   MULTIVITAMIN ADULT PO Take 1 tablet by mouth daily.   nitroGLYCERIN  0.4 MG SL tablet Commonly known as: NITROSTAT  PLACE 1 TABLET UNDER THE TONGUE EVERY 5 MINUTES AS NEEDED FOR CHEST PAIN.   olopatadine 0.1 % ophthalmic solution Commonly known as: PATANOL Place 1 drop into both eyes daily as needed for allergies.   omeprazole  40 MG capsule Commonly known as: PRILOSEC Take 1 capsule (40 mg total) by mouth daily.   psyllium 95 % Pack Commonly known as: HYDROCIL/METAMUCIL Take 1 packet by mouth daily.   QC TUMERIC COMPLEX PO Take 1 capsule by mouth in the morning and at bedtime.   spironolactone  50 MG tablet Commonly known as: Aldactone  Take 1 tablet (50 mg total) by mouth daily.   tirzepatide  5 MG/0.5ML Pen Commonly known as: MOUNJARO  Inject 5 mg into the skin once a week.        Allergies:  Allergies  Allergen Reactions   Sulfa Antibiotics Rash    Family History: Family History  Problem Relation Age of Onset   Vascular Disease Mother    Cancer Mother        Bone marrow   Kidney disease Mother    Thyroid  disease Mother    Hypertension Mother    Diabetes Mother    Depression Mother    Coronary artery disease Father    Colon cancer Father    Heart disease Father    Hyperlipidemia Father    Hypertension Father    Sudden death Father    Breast cancer Maternal Grandmother    Hypertension Maternal Grandmother    Stroke Maternal Grandmother    Lung cancer Maternal Grandfather    Diabetes Maternal Grandfather    Heart disease Maternal Grandfather    Hypertension Maternal Grandfather    Stroke Maternal Grandfather    Hypertension  Paternal Grandmother    Heart disease Paternal Grandfather    Hypertension Paternal Grandfather    Spina bifida Daughter    Depression Son    Anxiety disorder Son    Hypertension Son    Autism Son    Bipolar disorder Son    Autism Son     Social History:  reports that he quit smoking about 14 years ago. His smoking use included cigarettes. He  has never used smokeless tobacco. He reports current alcohol use. He reports current drug use. Drug: Marijuana.  ROS: All other review of systems were reviewed and are negative except what is noted above in HPI  Physical Exam: BP 130/75   Pulse 67   Constitutional:  Alert and oriented, No acute distress. HEENT: [place] AT, moist mucus membranes.  Trachea midline, no masses. Cardiovascular: No clubbing, cyanosis, or edema. Respiratory: Normal respiratory effort, no increased work of breathing. GI: Abdomen is soft, nontender, nondistended, no abdominal masses GU: No CVA tenderness.  Lymph: No cervical or inguinal lymphadenopathy. Skin: No rashes, bruises or suspicious lesions. Neurologic: Grossly intact, no focal deficits, moving all 4 extremities. Psychiatric: Normal mood and affect.  Laboratory Data: Lab Results  Component Value Date   WBC 5.5 12/06/2023   HGB 14.9 12/06/2023   HCT 43.9 12/06/2023   MCV 94 12/06/2023   PLT 243 12/06/2023    Lab Results  Component Value Date   CREATININE 0.85 12/06/2023    No results found for: "PSA"  Lab Results  Component Value Date   TESTOSTERONE  447 12/06/2023    Lab Results  Component Value Date   HGBA1C 7.0 (H) 12/06/2023    Urinalysis    Component Value Date/Time   APPEARANCEUR Clear 04/18/2023 1015   GLUCOSEU 3+ (A) 04/18/2023 1015   BILIRUBINUR Negative 04/18/2023 1015   KETONESUR trace (5) (A) 01/15/2023 0815   PROTEINUR Negative 04/18/2023 1015   UROBILINOGEN 0.2 01/15/2023 0815   NITRITE Negative 04/18/2023 1015   LEUKOCYTESUR Negative 04/18/2023 1015    Lab Results   Component Value Date   LABMICR <3.0 12/06/2023   WBCUA None seen 04/18/2023   LABEPIT None seen 04/18/2023   BACTERIA None seen 04/18/2023    Pertinent Imaging:  Results for orders placed in visit on 04/21/23  DG Abd 1 View  Narrative CLINICAL DATA:  Right lower quadrant abdominal pain  EXAM: ABDOMEN - 1 VIEW  COMPARISON:  08/25/2020  FINDINGS: Lap band noted, with band orientation at the 1 o'clock-7 o'clock angulation. No visible discontinuity in the tubing.  Unremarkable bowel gas pattern. Lower thoracic and lumbar spondylosis. Stable vascular calcifications in the anatomic pelvis.  IMPRESSION: 1. Lap band in place. 2. Lower thoracic and lumbar spondylosis.   Electronically Signed By: Freida Jes M.D. On: 04/21/2023 10:45  No results found for this or any previous visit.  No results found for this or any previous visit.  No results found for this or any previous visit.  No results found for this or any previous visit.  No results found for this or any previous visit.  No results found for this or any previous visit.  No results found for this or any previous visit.   Assessment & Plan:    1. Elevated PSA (Primary) IsoPSA, will call with results. If normal followup 6 months with PSA If the IsoPSA is high then we will likely proceed with biopsy - Urinalysis, Routine w reflex microscopic    No follow-ups on file.  Johnie Nailer, MD  [facility] [place]

## 2024-01-02 ENCOUNTER — Ambulatory Visit: Admitting: Surgery

## 2024-01-02 ENCOUNTER — Encounter: Payer: Self-pay | Admitting: Surgery

## 2024-01-02 VITALS — BP 115/75 | HR 67 | Temp 98.3°F | Resp 18 | Ht 69.5 in | Wt 331.0 lb

## 2024-01-02 DIAGNOSIS — K8020 Calculus of gallbladder without cholecystitis without obstruction: Secondary | ICD-10-CM | POA: Diagnosis not present

## 2024-01-02 NOTE — Progress Notes (Signed)
 [facility] History and Physical  Reason for Referral: Cholelithiasis Referring Physician: Lugenia Said, NP  Chief Complaint   New Patient (Initial Visit)     Daniel Scott is a 63 y.o. male.  HPI: Patient presents for evaluation of cholelithiasis.  Starting about 4 months ago he will have random episodes of nausea with vomiting.  He does not have these episodes every day.  He thinks that he is sometimes associated the episodes with showering.  He denies the episodes being associated with food.  He denies any abdominal pain associated with the episodes.  He has not had any symptoms over the last 2 weeks.  He has been having regular bowel movements and takes fiber daily.  His past medical history significant for diabetes, hypertension, and hyperlipidemia.  He takes a 325 mg aspirin  daily.  He has a surgical history significant for a lap band placement and hiatal hernia repair at that time.  His EMR states that he had reversal and removal of his Lap-Band with a laparoscopic Roux-en-Y surgery, however he underwent a CT scan in August to 2024 which demonstrates his Lap-Band is still in place without evidence of Roux-en-Y bypass.  He denies use of tobacco products.  He occasionally drinks alcohol.  Past Medical History:  Diagnosis Date   Anxiety    Arthritis    hands and feet   Back pain    DDD (degenerative disc disease), cervical    Depression    Diabetes mellitus without complication (HCC)    type 2   Edema of both lower extremities    Fatty liver    GERD (gastroesophageal reflux disease)    Hiatal hernia    Hyperlipidemia    Hypertension    Hypothyroidism    Joint pain    Morbid obesity (HCC)    Neuromuscular disorder (HCC)    stenosis   Palpitations    Pneumonia 1990   PONV (postoperative nausea and vomiting)    Right hip pain    Stenosis of cervical spine    Thyroid  disease     Past Surgical History:  Procedure Laterality Date   ANTERIOR CERVICAL  DECOMP/DISCECTOMY FUSION N/A 06/02/2020   Procedure: ANTERIOR CERVICAL DECOMPRESSION/DISCECTOMY FUSIONCERVICAL FOUR- CERVICAL FIVE, CERVICAL FIVE- CERVICAL SIX;  Surgeon: Dawley, Colby Daub, DO;  Location: [facility];  Service: Neurosurgery;  Laterality: N/A;  ANTERIOR CERVICAL DECOMPRESSION/DISCECTOMY FUSIONCERVICAL FOUR- CERVICAL FIVE, CERVICAL FIVE- CERVICAL SIX   CARDIAC CATHETERIZATION  1996; 2006   [facility]   COLONOSCOPY WITH PROPOFOL  N/A 12/08/2022   Procedure: COLONOSCOPY WITH PROPOFOL ;  Surgeon: Vinetta Greening, DO;  [facility];  Service: Endoscopy;  Laterality: N/A;  10:00 am , asa 3   HYDROCELE EXCISION     LAPAROSCOPIC GASTRIC BANDING     LAPAROSCOPIC ROUX-EN-Y GASTRIC BYPASS WITH UPPER ENDOSCOPY AND REMOVAL OF LAP BAND  02/2012   LEFT HEART CATH AND CORONARY ANGIOGRAPHY N/A 04/21/2022   Procedure: LEFT HEART CATH AND CORONARY ANGIOGRAPHY;  Surgeon: Arty Binning, MD;  [facility];  Service: Cardiovascular;  Laterality: N/A;   ligament replacement Right    right hand   POLYPECTOMY  12/08/2022   Procedure: POLYPECTOMY;  Surgeon: Vinetta Greening, DO;  [facility];  Service: Endoscopy;;   TONSILLECTOMY      Family History  Problem Relation Age of Onset   Vascular Disease Mother    Cancer Mother        Bone marrow  Kidney disease Mother    Thyroid  disease Mother    Hypertension Mother    Diabetes Mother    Depression Mother    Coronary artery disease Father    Colon cancer Father    Heart disease Father    Hyperlipidemia Father    Hypertension Father    Sudden death Father    Breast cancer Maternal Grandmother    Hypertension Maternal Grandmother    Stroke Maternal Grandmother    Lung cancer Maternal Grandfather    Diabetes Maternal Grandfather    Heart disease Maternal Grandfather    Hypertension Maternal Grandfather    Stroke Maternal Grandfather    Hypertension Paternal Grandmother    Heart disease Paternal  Grandfather    Hypertension Paternal Grandfather    Spina bifida Daughter    Depression Son    Anxiety disorder Son    Hypertension Son    Autism Son    Bipolar disorder Son    Autism Son     Social History   Tobacco Use   Smoking status: Former    Current packs/day: 0.00    Types: Cigarettes    Quit date: 05/2009    Years since quitting: 14.6   Smokeless tobacco: Never  Vaping Use   Vaping status: Never Used  Substance Use Topics   Alcohol use: Yes    Comment: Couple times a week   Drug use: Yes    Types: Marijuana    Comment: occ    Medications: I have reviewed the patient's current medications. Allergies as of 01/02/2024       Reactions   Sulfa Antibiotics Rash        Medication List        Accurate as of January 02, 2024  9:18 AM. If you have any questions, ask your nurse or doctor.          STOP taking these medications    meloxicam  15 MG tablet Commonly known as: MOBIC  Stopped by: Kamdon Reisig A Arturo Freundlich       TAKE these medications    amLODipine  10 MG tablet Commonly known as: NORVASC  TAKE 1 TABLET BY MOUTH EVERY DAY   APPLE CIDER VINEGAR PO Take 450 mg by mouth 2 (two) times daily.   aspirin  EC 325 MG tablet Take 325 mg by mouth daily.   atorvastatin  80 MG tablet Commonly known as: LIPITOR  TAKE 1 TABLET BY MOUTH EVERY DAY   CINNAMON PO Take 1,000 mg by mouth 2 (two) times daily.   clotrimazole -betamethasone  cream Commonly known as: LOTRISONE  Apply 1 Application topically daily.   Dialyvite Vitamin D  5000 125 MCG (5000 UT) capsule Generic drug: Cholecalciferol Take 5,000 Units by mouth daily.   famotidine  20 MG tablet Commonly known as: Pepcid  Take 1 tablet (20 mg total) by mouth 2 (two) times daily.   Garlic 1000 MG Caps Take 1,000 mg by mouth 2 (two) times daily.   Jardiance  25 MG Tabs tablet Generic drug: empagliflozin  TAKE 1 TABLET BY MOUTH EVERY DAY BEFORE BREAKFAST   Krill Oil 500 MG Caps Take 500 mg by mouth  daily.   levothyroxine  75 MCG tablet Commonly known as: SYNTHROID  TAKE 1 TABLET BY MOUTH EVERY DAY BEFORE BREAKFAST   lisinopril  20 MG tablet Commonly known as: ZESTRIL  TAKE 1 TABLET BY MOUTH EVERY DAY   metFORMIN  500 MG 24 hr tablet Commonly known as: GLUCOPHAGE -XR Take 2 tablets (1,000 mg total) by mouth 2 (two) times daily with a meal.   methocarbamol  500 MG  tablet Commonly known as: ROBAXIN  Take 1 tablet (500 mg total) by mouth every 6 (six) hours as needed for muscle spasms.   metoprolol  succinate 25 MG 24 hr tablet Commonly known as: Toprol  XL Take 1 tablet (25 mg total) by mouth daily.   MULTIVITAMIN ADULT PO Take 1 tablet by mouth daily.   nitroGLYCERIN  0.4 MG SL tablet Commonly known as: NITROSTAT  PLACE 1 TABLET UNDER THE TONGUE EVERY 5 MINUTES AS NEEDED FOR CHEST PAIN.   olopatadine 0.1 % ophthalmic solution Commonly known as: PATANOL Place 1 drop into both eyes daily as needed for allergies.   omeprazole  40 MG capsule Commonly known as: PRILOSEC Take 1 capsule (40 mg total) by mouth daily.   psyllium 95 % Pack Commonly known as: HYDROCIL/METAMUCIL Take 1 packet by mouth daily.   QC TUMERIC COMPLEX PO Take 1 capsule by mouth in the morning and at bedtime.   spironolactone  50 MG tablet Commonly known as: Aldactone  Take 1 tablet (50 mg total) by mouth daily.   tirzepatide  5 MG/0.5ML Pen Commonly known as: MOUNJARO  Inject 5 mg into the skin once a week.         ROS:  Constitutional: negative for chills, fatigue, and fevers Eyes: negative for visual disturbance and pain Ears, nose, mouth, throat, and face: negative for ear drainage, sore throat, and sinus problems Respiratory: negative for cough, wheezing, and shortness of breath Cardiovascular: negative for chest pain and palpitations Gastrointestinal: positive for nausea, negative for abdominal pain, reflux symptoms, and vomiting Genitourinary:negative for dysuria and  frequency Integument/breast: negative for dryness and rash Hematologic/lymphatic: negative for bleeding and petechiae Musculoskeletal:positive for back pain and neck pain Neurological: negative for dizziness and tremors Endocrine: negative for temperature intolerance  Blood pressure 115/75, pulse 67, temperature 98.3 F (36.8 C), temperature source Oral, resp. rate 18, height 5' 9.5" (1.765 m), weight (!) 331 lb (150.1 kg), SpO2 95%. Physical Exam Vitals reviewed.  Constitutional:      Appearance: Normal appearance.  HENT:     Head: Normocephalic and atraumatic.  Eyes:     Extraocular Movements: Extraocular movements intact.     Pupils: Pupils are equal, round, and reactive to light.  Cardiovascular:     Rate and Rhythm: Normal rate and regular rhythm.  Pulmonary:     Effort: Pulmonary effort is normal.     Breath sounds: Normal breath sounds.  Abdominal:     Comments: Abdomen soft, nondistended, no percussion tenderness, nontender to palpation; no rigidity, guarding, or rebound tenderness; negative Murphy's sign  Musculoskeletal:        General: Normal range of motion.     Cervical back: Normal range of motion.  Skin:    General: Skin is warm and dry.  Neurological:     General: No focal deficit present.     Mental Status: He is alert and oriented to person, place, and time.  Psychiatric:        Mood and Affect: Mood normal.        Behavior: Behavior normal.     Results: Results for orders placed or performed in visit on 01/01/24 (from the past 48 hours)  Urinalysis, Routine w reflex microscopic     Status: Abnormal   Collection Time: 01/01/24  9:30 AM  Result Value Ref Range   Specific Gravity, UA <1.005 (L) 1.005 - 1.030   pH, UA 6.0 5.0 - 7.5   Color, UA Yellow Yellow   Appearance Ur Clear Clear   Leukocytes,UA Negative Negative  Protein,UA Negative Negative/Trace   Glucose, UA 3+ (A) Negative   Ketones, UA Negative Negative   RBC, UA 1+ (A) Negative    Bilirubin, UA Negative Negative   Urobilinogen, Ur 0.2 0.2 - 1.0 mg/dL   Nitrite, UA Negative Negative   Microscopic Examination See below:     Comment: Microscopic was indicated and was performed.  Microscopic Examination     Status: Abnormal   Collection Time: 01/01/24  9:30 AM   Urine  Result Value Ref Range   WBC, UA None seen 0 - 5 /hpf   RBC, Urine 3-10 (A) 0 - 2 /hpf   Epithelial Cells (non renal) 0-10 0 - 10 /hpf   Bacteria, UA None seen None seen/Few    No results found.  Abdominal ultrasound (11/20/23): IMPRESSION: 1. Cholelithiasis without sonographic evidence of acute cholecystitis. 2. Increased echotexture of the liver. This is a nonspecific finding but can be seen in fatty infiltration of liver.   Assessment & Plan:  Daniel Scott is a 64 y.o. male who presents for evaluation of cholelithiasis.  -We discussed the pathophysiology of gallbladder disease and the recommendations for surgical removal of the gallbladder.  We further discussed that his nausea and vomiting could be related to his gallbladder, or it could be related to something else, especially since he does not have the typical constellation of symptoms with the nausea and vomiting being associated with pain or food. -I counseled the patient about the indication, risks and benefits of robotic assisted laparoscopic cholecystectomy.  He understands there is a very small chance for bleeding, infection, injury to normal structures (including common bile duct), conversion to open surgery, persistent symptoms, evolution of postcholecystectomy diarrhea, need for secondary interventions, anesthesia reaction, cardiopulmonary issues and other risks not specifically detailed here. I described the expected recovery, the plan for follow-up and the restrictions during the recovery phase.  All questions were answered. -After careful consideration, he would like to monitor his symptoms, especially since he has not had any  episodes of nausea or vomiting in the last 2 weeks. -I provided him with information regarding cholelithiasis, cholecystitis, cholecystectomy, and low-fat diet -Advised that he should call the office if his symptoms start to worsen or if he would like to proceed with surgery.  Advised that he should present to the emergency department if he begins to have worsening right upper quadrant abdominal pain, nausea, vomiting, fever, chills, jaundice skin -Follow up as needed  All questions were answered to the satisfaction of the patient.  Note: Portions of this report may have been transcribed using voice recognition software. Every effort has been made to ensure accuracy; however, inadvertent computerized transcription errors may still be present.   Lidia Reels, DO [facility] [address] [place], [place] [address] [phone] (office)

## 2024-01-04 ENCOUNTER — Encounter: Payer: Self-pay | Admitting: Urology

## 2024-01-04 NOTE — Patient Instructions (Signed)

## 2024-01-05 ENCOUNTER — Other Ambulatory Visit: Payer: Self-pay | Admitting: Family Medicine

## 2024-01-05 DIAGNOSIS — E1165 Type 2 diabetes mellitus with hyperglycemia: Secondary | ICD-10-CM

## 2024-01-08 ENCOUNTER — Encounter: Payer: Self-pay | Admitting: Urology

## 2024-01-12 ENCOUNTER — Other Ambulatory Visit: Payer: Self-pay | Admitting: Family Medicine

## 2024-01-12 DIAGNOSIS — G8929 Other chronic pain: Secondary | ICD-10-CM

## 2024-01-26 ENCOUNTER — Other Ambulatory Visit: Payer: Self-pay | Admitting: Family Medicine

## 2024-01-26 DIAGNOSIS — I700 Atherosclerosis of aorta: Secondary | ICD-10-CM

## 2024-01-26 DIAGNOSIS — E782 Mixed hyperlipidemia: Secondary | ICD-10-CM

## 2024-01-26 DIAGNOSIS — E1165 Type 2 diabetes mellitus with hyperglycemia: Secondary | ICD-10-CM

## 2024-01-28 ENCOUNTER — Other Ambulatory Visit: Payer: Self-pay | Admitting: Family Medicine

## 2024-01-28 DIAGNOSIS — I10 Essential (primary) hypertension: Secondary | ICD-10-CM

## 2024-03-03 ENCOUNTER — Other Ambulatory Visit: Payer: Self-pay | Admitting: Family Medicine

## 2024-03-03 DIAGNOSIS — G8929 Other chronic pain: Secondary | ICD-10-CM

## 2024-03-07 ENCOUNTER — Encounter: Payer: Self-pay | Admitting: Family Medicine

## 2024-03-07 ENCOUNTER — Ambulatory Visit: Admitting: Family Medicine

## 2024-03-07 VITALS — BP 110/70 | HR 53 | Temp 97.8°F | Ht 69.5 in | Wt 322.0 lb

## 2024-03-07 DIAGNOSIS — I700 Atherosclerosis of aorta: Secondary | ICD-10-CM

## 2024-03-07 DIAGNOSIS — E1165 Type 2 diabetes mellitus with hyperglycemia: Secondary | ICD-10-CM

## 2024-03-07 DIAGNOSIS — E1159 Type 2 diabetes mellitus with other circulatory complications: Secondary | ICD-10-CM

## 2024-03-07 DIAGNOSIS — E039 Hypothyroidism, unspecified: Secondary | ICD-10-CM

## 2024-03-07 DIAGNOSIS — Z7984 Long term (current) use of oral hypoglycemic drugs: Secondary | ICD-10-CM

## 2024-03-07 DIAGNOSIS — E1169 Type 2 diabetes mellitus with other specified complication: Secondary | ICD-10-CM | POA: Diagnosis not present

## 2024-03-07 DIAGNOSIS — E785 Hyperlipidemia, unspecified: Secondary | ICD-10-CM

## 2024-03-07 DIAGNOSIS — K219 Gastro-esophageal reflux disease without esophagitis: Secondary | ICD-10-CM

## 2024-03-07 DIAGNOSIS — F339 Major depressive disorder, recurrent, unspecified: Secondary | ICD-10-CM

## 2024-03-07 DIAGNOSIS — M62838 Other muscle spasm: Secondary | ICD-10-CM

## 2024-03-07 DIAGNOSIS — I152 Hypertension secondary to endocrine disorders: Secondary | ICD-10-CM

## 2024-03-07 LAB — BAYER DCA HB A1C WAIVED: HB A1C (BAYER DCA - WAIVED): 6.7 % — ABNORMAL HIGH (ref 4.8–5.6)

## 2024-03-07 MED ORDER — TIRZEPATIDE 7.5 MG/0.5ML ~~LOC~~ SOAJ: 7.5000 mg | SUBCUTANEOUS | 3 refills | Status: DC

## 2024-03-07 NOTE — Progress Notes (Signed)
 Established Patient Office Visit  Subjective   Patient ID: Daniel Scott, male    DOB: 1961-06-20  Age: 63 y.o. MRN: [id number]  Chief Complaint  Patient presents with   Medical Management of Chronic Issues    HPI T2DM Pt presents for evaluation of Type 2 diabetes mellitus. Patient denies foot ulcerations, increased appetite, nausea, polydipsia, polyuria, visual disturbances, vomiting, and weight loss.  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   Urine microalbumin UTD? Yes Is He on ACE inhibitor or angiotensin II receptor blocker?  Yes, lisinopril  Is He on statin? Yes atorvastatin  Is He on ASA 81 mg daily?  Yes  2. HTN Complaint with meds - Yes Current Medications - amlodipine  10 mg, lisinopril  20 mg, spironlactone 50 mg, metoprolol  Checking BP at home - no Pertinent ROS:  Headache - No Fatigue - No Visual Disturbances - No Chest pain - No Dyspnea - No Palpitations - No LE edema - No  3. HLD On atorvastatin  80 mg.   4. GERD On omeprazole . He has cut back dosage to 20 mg. Stopped pepcid . Reports well controlled. Denies nausea, vomiting, dysphagia, water brash, abdominal pain. Hx of lap band surgery 11 years ago.   5. Hypothyroid On levothyroxine  75 mcg. Compliant with medication. No symptoms.   7. Chronic back pain Having to help his wife who just had shoulder surgery. He has had increased pain in one area under his shoulder blade. No red flag symptoms. He has been taking robaxin . Also took tylenol  arthritis this morning.       10/23/2023    8:44 AM 08/07/2023    8:14 AM 06/16/2023    1:42 PM  Depression screen PHQ 2/9  Decreased Interest 2 1 1   Down, Depressed, Hopeless 1 1 1   PHQ - 2 Score 3 2 2   Altered sleeping 1 2 1   Tired, decreased energy 1 1 2   Change in appetite 0 0 1   Feeling bad or failure about yourself  1 1 1   Trouble concentrating 1 0 0  Moving slowly or fidgety/restless 0 0 0  Suicidal thoughts 0 0 0  PHQ-9 Score 7 6 7   Difficult doing work/chores Somewhat difficult Somewhat difficult Somewhat difficult      10/23/2023    8:45 AM 08/07/2023    8:15 AM 04/21/2023    9:39 AM 04/05/2023    8:17 AM  GAD 7 : Generalized Anxiety Score  Nervous, Anxious, on Edge 1 1 0 0  Control/stop worrying 1 1 1  0  Worry too much - different things 1 1 1  0  Trouble relaxing 1 1 1  0  Restless 0 0 0 0  Easily annoyed or irritable 1 2 1  0  Afraid - awful might happen 1 1 1  0  Total GAD 7 Score 6 7 5  0  Anxiety Difficulty Somewhat difficult Somewhat difficult Somewhat difficult Not difficult at all         Past Medical History:  Diagnosis Date   Anxiety    Arthritis    hands and feet   Back pain    DDD (degenerative disc disease), cervical    Depression    Diabetes mellitus without complication (HCC)    type 2   Edema of both lower extremities    Fatty liver    GERD (gastroesophageal  reflux disease)    Hiatal hernia    Hyperlipidemia    Hypertension    Hypothyroidism    Joint pain    Morbid obesity (HCC)    Neuromuscular disorder (HCC)    stenosis   Palpitations    Pneumonia 1990   PONV (postoperative nausea and vomiting)    Right hip pain    Stenosis of cervical spine    Thyroid  disease       ROS As per HPI.    Objective:     BP 110/70   Pulse (!) 53   Temp 97.8 F (36.6 C) (Temporal)   Ht 5' 9.5 (1.765 m)   Wt (!) 322 lb (146.1 kg)   SpO2 97%   BMI 46.87 kg/m  Wt Readings from Last 3 Encounters:  03/07/24 (!) 322 lb (146.1 kg)  01/02/24 (!) 331 lb (150.1 kg)  12/06/23 (!) 333 lb 3.2 oz (151.1 kg)     Physical Exam Vitals and nursing note reviewed.  Constitutional:      General: He is not in acute distress.    Appearance: He is obese. He is not ill-appearing, toxic-appearing or diaphoretic.  HENT:     Head:  Normocephalic and atraumatic.  Cardiovascular:     Rate and Rhythm: Normal rate and regular rhythm.     Heart sounds: Normal heart sounds. No murmur heard. Pulmonary:     Effort: Pulmonary effort is normal. No respiratory distress.     Breath sounds: Normal breath sounds.  Abdominal:     General: Bowel sounds are normal.     Palpations: Abdomen is soft.  Musculoskeletal:     Cervical back: Neck supple. No erythema or rigidity. Muscular tenderness (bilateral) present. No pain with movement or spinous process tenderness. Normal range of motion.     Right lower leg: No edema.     Left lower leg: No edema.  Skin:    General: Skin is warm and dry.  Neurological:     General: No focal deficit present.     Mental Status: He is alert and oriented to person, place, and time.  Psychiatric:        Mood and Affect: Mood normal.        Behavior: Behavior normal.        Thought Content: Thought content normal.        Judgment: Judgment normal.    Lipid Panel     Component Value Date/Time   CHOL 140 12/06/2023 0757   TRIG 130 12/06/2023 0757   HDL 46 12/06/2023 0757   CHOLHDL 3.0 12/06/2023 0757   LDLCALC 71 12/06/2023 0757   LABVLDL 23 12/06/2023 0757     No results found for any visits on 03/07/24.    The 10-year ASCVD risk score (Arnett DK, et al., 2019) is: 15.9%    Assessment & Plan:   [name] was seen today for medical management of chronic issues.  Diagnoses and all orders for this visit:  Type 2 diabetes mellitus with hyperglycemia, without long-term current use of insulin  (HCC) A1c 6.7 today, at goal of <7. Increase moujaro to 7.5 mg. Continue jardiance . He is on an ACE/ARB and statin. Diet and exercise.  -     Bayer DCA Hb A1c Waived -     tirzepatide  (MOUNJARO ) 7.5 MG/0.5ML Pen; Inject 7.5 mg into the skin once a week.  Hypertension associated with type 2 diabetes mellitus (HCC) Well controlled on current regimen.   Hyperlipidemia associated with type 2 diabetes  mellitus (HCC) On statin. Last LDL 71.  Acquired hypothyroidism Continue levothyroxine .   Gastroesophageal reflux disease, unspecified whether esophagitis present Well controlled with omeprazole .   Morbid obesity (HCC) Trending down.   Aortic atherosclerosis (HCC) On statin.  Depression, recurrent (HCC) Denies SI. Has declined treatment.   Muscle spasm Continue robaxin .   Return in about 3 months (around 06/07/2024) for chronic follow up.   The patient indicates understanding of these issues and agrees with the plan.  Annabella CHRISTELLA Search, FNP

## 2024-04-02 ENCOUNTER — Other Ambulatory Visit: Payer: Self-pay | Admitting: Family Medicine

## 2024-04-02 DIAGNOSIS — E1165 Type 2 diabetes mellitus with hyperglycemia: Secondary | ICD-10-CM

## 2024-05-16 ENCOUNTER — Encounter: Payer: Self-pay | Admitting: *Deleted

## 2024-05-23 ENCOUNTER — Encounter: Payer: Self-pay | Admitting: *Deleted

## 2024-05-24 ENCOUNTER — Telehealth: Admitting: Physician Assistant

## 2024-05-24 DIAGNOSIS — J069 Acute upper respiratory infection, unspecified: Secondary | ICD-10-CM | POA: Diagnosis not present

## 2024-05-24 MED ORDER — BENZONATATE 100 MG PO CAPS
100.0000 mg | ORAL_CAPSULE | Freq: Two times a day (BID) | ORAL | 0 refills | Status: DC | PRN
Start: 2024-05-24 — End: 2024-06-12

## 2024-05-24 MED ORDER — AZELASTINE HCL 0.1 % NA SOLN: 2.0000 | Freq: Two times a day (BID) | NASAL | 12 refills | Status: DC

## 2024-05-24 MED ORDER — CETIRIZINE HCL 10 MG PO TABS: 10.0000 mg | ORAL_TABLET | Freq: Every day | ORAL | 0 refills | Status: DC

## 2024-05-24 NOTE — Progress Notes (Signed)
 Virtual Visit Consent   Daniel Scott, you are scheduled for a virtual visit with a [facility] provider today. Just as with appointments in the office, your consent must be obtained to participate. Your consent will be active for this visit and any virtual visit you may have with one of our providers in the next 365 days. If you have a MyChart account, a copy of this consent can be sent to you electronically.  As this is a virtual visit, video technology does not allow for your provider to perform a traditional examination. This may limit your provider's ability to fully assess your condition. If your provider identifies any concerns that need to be evaluated in person or the need to arrange testing (such as labs, EKG, etc.), we will make arrangements to do so. Although advances in technology are sophisticated, we cannot ensure that it will always work on either your end or our end. If the connection with a video visit is poor, the visit may have to be switched to a telephone visit. With either a video or telephone visit, we are not always able to ensure that we have a secure connection.  By engaging in this virtual visit, you consent to the provision of healthcare and authorize for your insurance to be billed (if applicable) for the services provided during this visit. Depending on your insurance coverage, you may receive a charge related to this service.  I need to obtain your verbal consent now. Are you willing to proceed with your visit today? Daniel Scott has provided verbal consent on 05/24/2024 for a virtual visit (video or telephone). Daniel Scott, [place]  Date: 05/24/2024 8:03 AM   Virtual Visit via Video Note   I, Daniel Scott, connected with  Daniel Scott  ([id number], 01-05-1961) on 05/24/24 at  7:45 AM EDT by a video-enabled telemedicine application and verified that I am speaking with the correct person using two identifiers.  Location: Patient: Virtual Visit Location Patient:  Home Provider: Virtual Visit Location Provider: Home Office   I discussed the limitations of evaluation and management by telemedicine and the availability of in person appointments. The patient expressed understanding and agreed to proceed.    History of Present Illness: Daniel Scott is a 63 y.o. who identifies as a male who was assigned male at birth, and is being seen today for URI.  HPI: URI  This is a new problem. The current episode started yesterday. The problem has been unchanged. There has been no fever. Associated symptoms include coughing, rhinorrhea, sneezing and a sore throat. He has tried NSAIDs for the symptoms. The treatment provided mild relief.    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) 09/08/2022   NSVT (nonsustained ventricular tachycardia) (HCC) 09/08/2022   Primary osteoarthritis of right hip 06/07/2022   Chronic right hip pain 06/06/2022   Chronic midline low back  pain with bilateral sciatica 03/02/2022   Lumbar nerve root impingement 03/02/2022   Erectile dysfunction 03/02/2022   Aortic atherosclerosis (HCC) 08/03/2021   Acquired hypothyroidism 08/03/2021   DDD (degenerative disc disease), cervical 02/18/2021   Hypertension associated with type 2 diabetes mellitus (HCC) 06/13/2020   Type 2 diabetes mellitus with hyperglycemia, without long-term  current use of insulin  (HCC) 03/10/2020   Morbid obesity (HCC) 03/10/2020   Hyperlipidemia associated with type 2 diabetes mellitus (HCC)    GERD (gastroesophageal reflux disease)     Allergies:  Allergies  Allergen Reactions   Sulfa Antibiotics Rash   Medications:  Current Outpatient Medications:    amLODipine  (NORVASC ) 10 MG tablet, TAKE 1 TABLET BY MOUTH EVERY DAY, Disp: 30 tablet, Rfl: 5   APPLE CIDER VINEGAR PO, Take 450 mg by mouth 2 (two) times daily., Disp: , Rfl:    aspirin  325 MG EC tablet, Take 325 mg by mouth daily., Disp: , Rfl:    atorvastatin  (LIPITOR ) 80 MG tablet, TAKE 1 TABLET BY MOUTH EVERY DAY, Disp: 30 tablet, Rfl: 5   cholecalciferol (VITAMIN D3) 25 MCG (1000 UNIT) tablet, Take 1,000 Units by mouth daily., Disp: , Rfl:    CINNAMON PO, Take 1,000 mg by mouth 2 (two) times daily., Disp: , Rfl:    clotrimazole -betamethasone  (LOTRISONE ) cream, Apply 1 Application topically daily., Disp: 90 g, Rfl: 0   Garlic 1000 MG CAPS, Take 1,000 mg by mouth 2 (two) times daily., Disp: , Rfl:    JARDIANCE  25 MG TABS tablet, TAKE 1 TABLET BY MOUTH EVERY DAY BEFORE BREAKFAST, Disp: 90 tablet, Rfl: 0   Krill Oil 500 MG CAPS, Take 500 mg by mouth daily., Disp: , Rfl:    levothyroxine  (SYNTHROID ) 75 MCG tablet, TAKE 1 TABLET BY MOUTH EVERY DAY BEFORE BREAKFAST, Disp: 90 tablet, Rfl: 2   lisinopril  (ZESTRIL ) 20 MG tablet, TAKE 1 TABLET BY MOUTH EVERY DAY, Disp: 90 tablet, Rfl: 3   metFORMIN  (GLUCOPHAGE -XR) 500 MG 24 hr tablet, Take 2 tablets (1,000 mg total) by mouth 2 (two) times daily with a meal., Disp: 360 tablet, Rfl: 3   methocarbamol  (ROBAXIN ) 500 MG tablet, TAKE 1 TABLET BY MOUTH EVERY 6 HOURS AS NEEDED FOR MUSCLE SPASMS., Disp: 60 tablet, Rfl: 3   metoprolol  succinate (TOPROL  XL) 25 MG 24 hr tablet, Take 1 tablet (25 mg total) by mouth daily., Disp: 90 tablet, Rfl: 3   Multiple Vitamin (MULTIVITAMIN ADULT PO), Take 1 tablet by mouth daily. , Disp: , Rfl:    nitroGLYCERIN   (NITROSTAT ) 0.4 MG SL tablet, PLACE 1 TABLET UNDER THE TONGUE EVERY 5 MINUTES AS NEEDED FOR CHEST PAIN., Disp: 25 tablet, Rfl: 9   olopatadine (PATANOL) 0.1 % ophthalmic solution, Place 1 drop into both eyes daily as needed for allergies., Disp: , Rfl:    omeprazole  (PRILOSEC) 20 MG capsule, Take 20 mg by mouth daily., Disp: , Rfl:    psyllium (HYDROCIL/METAMUCIL) 95 % PACK, Take 1 packet by mouth daily., Disp: , Rfl:    spironolactone  (ALDACTONE ) 50 MG tablet, Take 1 tablet (50 mg total) by mouth daily., Disp: 90 tablet, Rfl: 3   tirzepatide  (MOUNJARO ) 7.5 MG/0.5ML Pen, Inject 7.5 mg into the skin once a week., Disp: 6 mL, Rfl: 3   Turmeric (QC TUMERIC COMPLEX PO), Take 1 capsule by mouth in the morning and at bedtime., Disp: , Rfl:   Observations/Objective: Patient is well-developed, well-nourished in no acute distress.  Resting comfortably  at home.  Head is normocephalic, atraumatic.  No labored breathing.  Speech is clear and coherent with logical content.  Patient is alert and oriented at baseline.    Assessment and Plan: 1. Upper respiratory tract infection, unspecified type (Primary)  Patient presenting with URI. Differentials include allergic rhinitis, COVID,  bacterial pneumonia, sinusitis. Do not suspect underlying cardiopulmonary process. I considered, but think unlikely, dangerous causes of this patient's symptoms to include ACS, CHF or pneumonia, pneumothorax. Patient is nontoxic and not in need of emergent medical intervention. I did advise patient to complete an at home COVID test.   Plan: reassurance, reassessment, over the counter medications, discharge with PCP follow-up  Follow Up Instructions: I discussed the assessment and treatment plan with the patient. The patient was provided an opportunity to ask questions and all were answered. The patient agreed with the plan and demonstrated an understanding of the instructions.  A copy of instructions were sent to the patient  via MyChart unless otherwise noted below.     The patient was advised to call back or seek an in-person evaluation if the symptoms worsen or if the condition fails to improve as anticipated.    Daniel Shuck, PA-C

## 2024-05-24 NOTE — Patient Instructions (Signed)
 Daniel Scott, thank you for joining [name], PA-C for today's virtual visit.  While this provider is not your primary care provider (PCP), if your PCP is located in our provider database this encounter information will be shared with them immediately following your visit.   A [facility] MyChart account gives you access to today's visit and all your visits, tests, and labs performed at [facility]  click here if you don't have a [facility] MyChart account or go to [email or web address]  Consent: (Patient) Daniel Scott provided verbal consent for this virtual visit at the beginning of the encounter.  Current Medications:  Current Outpatient Medications:    amLODipine  (NORVASC ) 10 MG tablet, TAKE 1 TABLET BY MOUTH EVERY DAY, Disp: 30 tablet, Rfl: 5   APPLE CIDER VINEGAR PO, Take 450 mg by mouth 2 (two) times daily., Disp: , Rfl:    aspirin  325 MG EC tablet, Take 325 mg by mouth daily., Disp: , Rfl:    atorvastatin  (LIPITOR ) 80 MG tablet, TAKE 1 TABLET BY MOUTH EVERY DAY, Disp: 30 tablet, Rfl: 5   cholecalciferol (VITAMIN D3) 25 MCG (1000 UNIT) tablet, Take 1,000 Units by mouth daily., Disp: , Rfl:    CINNAMON PO, Take 1,000 mg by mouth 2 (two) times daily., Disp: , Rfl:    clotrimazole -betamethasone  (LOTRISONE ) cream, Apply 1 Application topically daily., Disp: 90 g, Rfl: 0   Garlic 1000 MG CAPS, Take 1,000 mg by mouth 2 (two) times daily., Disp: , Rfl:    JARDIANCE  25 MG TABS tablet, TAKE 1 TABLET BY MOUTH EVERY DAY BEFORE BREAKFAST, Disp: 90 tablet, Rfl: 0   Krill Oil 500 MG CAPS, Take 500 mg by mouth daily., Disp: , Rfl:    levothyroxine  (SYNTHROID ) 75 MCG tablet, TAKE 1 TABLET BY MOUTH EVERY DAY BEFORE BREAKFAST, Disp: 90 tablet, Rfl: 2   lisinopril  (ZESTRIL ) 20 MG tablet, TAKE 1 TABLET BY MOUTH EVERY DAY, Disp: 90 tablet, Rfl: 3   metFORMIN  (GLUCOPHAGE -XR) 500 MG 24 hr tablet, Take 2 tablets (1,000 mg total) by mouth 2 (two) times daily with a meal., Disp: 360  tablet, Rfl: 3   methocarbamol  (ROBAXIN ) 500 MG tablet, TAKE 1 TABLET BY MOUTH EVERY 6 HOURS AS NEEDED FOR MUSCLE SPASMS., Disp: 60 tablet, Rfl: 3   metoprolol  succinate (TOPROL  XL) 25 MG 24 hr tablet, Take 1 tablet (25 mg total) by mouth daily., Disp: 90 tablet, Rfl: 3   Multiple Vitamin (MULTIVITAMIN ADULT PO), Take 1 tablet by mouth daily. , Disp: , Rfl:    nitroGLYCERIN  (NITROSTAT ) 0.4 MG SL tablet, PLACE 1 TABLET UNDER THE TONGUE EVERY 5 MINUTES AS NEEDED FOR CHEST PAIN., Disp: 25 tablet, Rfl: 9   olopatadine (PATANOL) 0.1 % ophthalmic solution, Place 1 drop into both eyes daily as needed for allergies., Disp: , Rfl:    omeprazole  (PRILOSEC) 20 MG capsule, Take 20 mg by mouth daily., Disp: , Rfl:    psyllium (HYDROCIL/METAMUCIL) 95 % PACK, Take 1 packet by mouth daily., Disp: , Rfl:    spironolactone  (ALDACTONE ) 50 MG tablet, Take 1 tablet (50 mg total) by mouth daily., Disp: 90 tablet, Rfl: 3   tirzepatide  (MOUNJARO ) 7.5 MG/0.5ML Pen, Inject 7.5 mg into the skin once a week., Disp: 6 mL, Rfl: 3   Turmeric (QC TUMERIC COMPLEX PO), Take 1 capsule by mouth in the morning and at bedtime., Disp: , Rfl:    Medications ordered in this encounter:  No orders of the defined types were placed in this  encounter.    *If you need refills on other medications prior to your next appointment, please contact your pharmacy*  Follow-Up: Call back or seek an in-person evaluation if the symptoms worsen or if the condition fails to improve as anticipated.  [facility] [phone]  Other Instructions Please report to the nearest Emergency room with any worsening symptoms. Follow up with primary care provider (PCP) in 2 -3 days.    If you have been instructed to have an in-person evaluation today at a local Urgent Care facility, please use the link below. It will take you to a list of all of our available [facility], including address, phone number and hours of operation. Please  do not delay care.  [facility]  If you or a family member do not have a primary care provider, use the link below to schedule a visit and establish care. When you choose a [facility] primary care physician or advanced practice provider, you gain a long-term partner in health. Find a Primary Care Provider  Learn more about [facility] in-office and virtual care options: [facility] Get Care Now

## 2024-05-26 ENCOUNTER — Ambulatory Visit

## 2024-06-05 ENCOUNTER — Ambulatory Visit: Admitting: Orthopaedic Surgery

## 2024-06-07 ENCOUNTER — Ambulatory Visit: Admitting: Family Medicine

## 2024-06-12 ENCOUNTER — Encounter: Payer: Self-pay | Admitting: Family Medicine

## 2024-06-12 ENCOUNTER — Ambulatory Visit: Admitting: Family Medicine

## 2024-06-12 VITALS — BP 112/79 | HR 63 | Temp 98.0°F | Ht 69.5 in | Wt 309.2 lb

## 2024-06-12 DIAGNOSIS — M25511 Pain in right shoulder: Secondary | ICD-10-CM

## 2024-06-12 DIAGNOSIS — Z7984 Long term (current) use of oral hypoglycemic drugs: Secondary | ICD-10-CM

## 2024-06-12 DIAGNOSIS — Z23 Encounter for immunization: Secondary | ICD-10-CM

## 2024-06-12 DIAGNOSIS — G8929 Other chronic pain: Secondary | ICD-10-CM

## 2024-06-12 DIAGNOSIS — E1169 Type 2 diabetes mellitus with other specified complication: Secondary | ICD-10-CM | POA: Diagnosis not present

## 2024-06-12 DIAGNOSIS — I700 Atherosclerosis of aorta: Secondary | ICD-10-CM

## 2024-06-12 DIAGNOSIS — F339 Major depressive disorder, recurrent, unspecified: Secondary | ICD-10-CM

## 2024-06-12 DIAGNOSIS — R972 Elevated prostate specific antigen [PSA]: Secondary | ICD-10-CM

## 2024-06-12 DIAGNOSIS — E1165 Type 2 diabetes mellitus with hyperglycemia: Secondary | ICD-10-CM | POA: Diagnosis not present

## 2024-06-12 DIAGNOSIS — L819 Disorder of pigmentation, unspecified: Secondary | ICD-10-CM

## 2024-06-12 DIAGNOSIS — E1159 Type 2 diabetes mellitus with other circulatory complications: Secondary | ICD-10-CM

## 2024-06-12 DIAGNOSIS — K219 Gastro-esophageal reflux disease without esophagitis: Secondary | ICD-10-CM

## 2024-06-12 DIAGNOSIS — F419 Anxiety disorder, unspecified: Secondary | ICD-10-CM

## 2024-06-12 DIAGNOSIS — M25551 Pain in right hip: Secondary | ICD-10-CM

## 2024-06-12 DIAGNOSIS — I152 Hypertension secondary to endocrine disorders: Secondary | ICD-10-CM

## 2024-06-12 LAB — BAYER DCA HB A1C WAIVED: HB A1C (BAYER DCA - WAIVED): 6.4 % — ABNORMAL HIGH (ref 4.8–5.6)

## 2024-06-12 MED ORDER — TIRZEPATIDE 10 MG/0.5ML ~~LOC~~ SOAJ: 10.0000 mg | SUBCUTANEOUS | 3 refills | Status: DC

## 2024-06-12 NOTE — Progress Notes (Signed)
 Established Patient Office Visit  Subjective   Patient ID: Daniel Scott, male    DOB: 1961/01/28  Age: 63 y.o. MRN: [id number]  Chief Complaint  Patient presents with   Medical Management of Chronic Issues    HPI  History of Present Illness   Daniel Scott is a 63 year old male with type 2 diabetes who presents for follow-up of his diabetes management and other health concerns.  Glycemic control and weight changes - Type 2 diabetes with recent improvement in glycemic control; A1c decreased from 7.0 to 6.4 - compliant with Jardiance  and metformin  - Currently taking Mounjaro  without side effects - Weight trending down  Right hip and thigh pain - Ongoing right hip pain, initially improved with weight loss but has since returned - Pain radiates from right buttock down the middle part of the upper thigh - Will notify ortho regarding symptoms  Shoulder pain and dysfunction - Bilateral shoulder issues, with particular concern for the right shoulder - Right shoulder described as 'a mess' and producing a 'maraca' sound - Will schedule appt with ortho regarding symptoms  Urinary symptoms and psa monitoring - Following up with urology for PSA issue identified six months ago - New urinary symptoms including decreased urinary flow  Gallbladder-related nausea - Occasional nausea attributed to gallbladder issues - Nausea less severe than previously experienced  Skin lesion on back - irregularly shaped, raised, and dark spots on the back - Change in lesion first noticed approximately ten days ago - No associated itching or bleeding  Mood and energy changes - Increased irritability and anger over the past few months - Significant change in mood and energy levels - History of antidepressant use, discontinued seven to eight years ago - Interested in counseling          06/12/2024    9:35 AM 03/07/2024    8:00 AM 10/23/2023    8:44 AM  Depression screen PHQ 2/9  Decreased  Interest 2 1 2   Down, Depressed, Hopeless 1 0 1  PHQ - 2 Score 3 1 3   Altered sleeping 1 1 1   Tired, decreased energy 2 1 1   Change in appetite 0 0 0  Feeling bad or failure about yourself  1 1 1   Trouble concentrating 0 0 1  Moving slowly or fidgety/restless 0 0 0  Suicidal thoughts 0 0 0  PHQ-9 Score 7 4 7   Difficult doing work/chores Somewhat difficult Not difficult at all Somewhat difficult      06/12/2024    9:36 AM 03/07/2024    8:01 AM 10/23/2023    8:45 AM 08/07/2023    8:15 AM  GAD 7 : Generalized Anxiety Score  Nervous, Anxious, on Edge 0 1 1 1   Control/stop worrying 1 1 1 1   Worry too much - different things 2 1 1 1   Trouble relaxing 1 1 1 1   Restless 0 0 0 0  Easily annoyed or irritable 2 1 1 2   Afraid - awful might happen 2 1 1 1   Total GAD 7 Score 8 6 6 7   Anxiety Difficulty Somewhat difficult Somewhat difficult Somewhat difficult Somewhat difficult       ROS    Objective:     BP 112/79   Pulse 63   Temp 98 F (36.7 C) (Temporal)   Ht 5' 9.5 (1.765 m)   Wt (!) 309 lb 3.2 oz (140.3 kg)   SpO2 98%   BMI 45.01 kg/m  Wt Readings from  Last 3 Encounters:  06/12/24 (!) 309 lb 3.2 oz (140.3 kg)  03/07/24 (!) 322 lb (146.1 kg)  01/02/24 (!) 331 lb (150.1 kg)      Physical Exam   No results found for any visits on 06/12/24.    The 10-year ASCVD risk score (Arnett DK, et al., 2019) is: 16.4%    Assessment & Plan:   [name] was seen today for medical management of chronic issues.  Diagnoses and all orders for this visit:  Type 2 diabetes mellitus with hyperglycemia, without long-term current use of insulin  (HCC) -     Bayer DCA Hb A1c Waived -     Vitamin B12 -     tirzepatide  (MOUNJARO ) 10 MG/0.5ML Pen; Inject 10 mg into the skin once a week.  Morbid obesity (HCC)  Hypertension associated with type 2 diabetes mellitus (HCC)  Hyperlipidemia associated with type 2 diabetes mellitus (HCC)  Gastroesophageal reflux disease, unspecified  whether esophagitis present  Depression, recurrent -     Ambulatory referral to Integrated Behavioral Health  Anxiety -     Ambulatory referral to Integrated Behavioral Health  Aortic atherosclerosis  Chronic right hip pain  Bilateral shoulder pain, unspecified chronicity  Elevated PSA  Atypical pigmented skin lesion -     Ambulatory referral to Dermatology  Encounter for immunization -     Tdap vaccine greater than or equal to 7yo IM      Type 2 diabetes mellitus Well-controlled with A1c of 6.4%. No medication side effects.  - Increase Mounjaro  to 10 mg for additional weight loss and glycemic control.  Morbid obesity Recent weight loss. Motivated for further weight loss. Tolerating medication well. - Increase Mounjaro  to 10 mg to aid in further weight loss.  HTN BP at goal.   HLD On statin.   GERD Well controlled.   Aortic atherosclerosis On aspirin .   Elevated PSA Keep follow up with urology.   Chronic right hip pain Chronic pain returned after initial improvement with weight loss. - Contact Dr. Vernetta for further evaluation and management.  Shoulder pain - He will schedule an appointment with ortho  Depression and anxiety Uncontrolled. Increased irritability and anger. Interested in therapy. No current suicidal ideation. - Refer to Redell Corn, LCSW, for counseling. Patient and/or legal guardian verbally consented to [facility] services about presenting concerns and psychiatric consultation as appropriate.  The services will be billed as appropriate for the patient.   Abnormal skin lesion on back Raised, irregularly shaped lesion with darker spots - Refer to Dr. Shona in [place] for dermatological evaluation.      Return in about 4 months (around 10/13/2024) for chronic follow up.   The patient indicates understanding of these issues and agrees with the plan.  Annabella CHRISTELLA Search, FNP

## 2024-06-13 ENCOUNTER — Ambulatory Visit: Payer: Self-pay | Admitting: Family Medicine

## 2024-06-13 LAB — VITAMIN B12: Vitamin B-12: 595 pg/mL (ref 232–1245)

## 2024-06-17 ENCOUNTER — Encounter: Payer: Self-pay | Admitting: Family Medicine

## 2024-06-17 DIAGNOSIS — M25511 Pain in right shoulder: Secondary | ICD-10-CM

## 2024-06-26 ENCOUNTER — Other Ambulatory Visit

## 2024-06-26 DIAGNOSIS — R972 Elevated prostate specific antigen [PSA]: Secondary | ICD-10-CM

## 2024-06-27 LAB — PSA, TOTAL AND FREE
PSA, Free Pct: 32.6 %
PSA, Free: 1.14 ng/mL
Prostate Specific Ag, Serum: 3.5 ng/mL (ref 0.0–4.0)

## 2024-06-30 ENCOUNTER — Other Ambulatory Visit: Payer: Self-pay | Admitting: Family Medicine

## 2024-06-30 DIAGNOSIS — E1165 Type 2 diabetes mellitus with hyperglycemia: Secondary | ICD-10-CM

## 2024-06-30 DIAGNOSIS — G8929 Other chronic pain: Secondary | ICD-10-CM

## 2024-07-02 ENCOUNTER — Ambulatory Visit: Admitting: Orthopedic Surgery

## 2024-07-02 ENCOUNTER — Ambulatory Visit: Payer: Self-pay | Admitting: Urology

## 2024-07-03 ENCOUNTER — Encounter: Payer: Self-pay | Admitting: Urology

## 2024-07-03 ENCOUNTER — Ambulatory Visit: Admitting: Urology

## 2024-07-03 VITALS — BP 118/74 | HR 52

## 2024-07-03 DIAGNOSIS — N401 Enlarged prostate with lower urinary tract symptoms: Secondary | ICD-10-CM

## 2024-07-03 DIAGNOSIS — R3129 Other microscopic hematuria: Secondary | ICD-10-CM

## 2024-07-03 DIAGNOSIS — R351 Nocturia: Secondary | ICD-10-CM

## 2024-07-03 DIAGNOSIS — N138 Other obstructive and reflux uropathy: Secondary | ICD-10-CM

## 2024-07-03 DIAGNOSIS — R972 Elevated prostate specific antigen [PSA]: Secondary | ICD-10-CM

## 2024-07-03 LAB — URINALYSIS, ROUTINE W REFLEX MICROSCOPIC
Bilirubin, UA: NEGATIVE
Ketones, UA: NEGATIVE
Leukocytes,UA: NEGATIVE
Nitrite, UA: NEGATIVE
Protein,UA: NEGATIVE
Specific Gravity, UA: 1.01 (ref 1.005–1.030)
Urobilinogen, Ur: 0.2 mg/dL (ref 0.2–1.0)
pH, UA: 6.5 (ref 5.0–7.5)

## 2024-07-03 LAB — MICROSCOPIC EXAMINATION
Bacteria, UA: NONE SEEN
Epithelial Cells (non renal): NONE SEEN /HPF (ref 0–10)
WBC, UA: NONE SEEN /HPF (ref 0–5)

## 2024-07-03 NOTE — Patient Instructions (Signed)

## 2024-07-03 NOTE — Progress Notes (Signed)
 07/03/2024 8:40 AM   Daniel Scott 1961-03-17 [id number]  Referring provider: Joesph Annabella HERO, FNP [address] [place],  [place] [address]  Followup elevated PSA   HPI: Mr Daniel Scott is a 62yo here for followup for elevated PSAPSA decreased to 3.5 from 5.2. IPSS 13 QOl 3 on no BPH therapy. Uirne stream strong. He has intermittent urinary hesitancy. He has starting/stopping of his stream,. Nocturia 2x depending on fluid consumption. No other complaints today   PMH: Past Medical History:  Diagnosis Date   Anxiety    Arthritis    hands and feet   Back pain    DDD (degenerative disc disease), cervical    Depression    Diabetes mellitus without complication (HCC)    type 2   Edema of both lower extremities    Fatty liver    GERD (gastroesophageal reflux disease)    Hiatal hernia    Hyperlipidemia    Hypertension    Hypothyroidism    Joint pain    Morbid obesity (HCC)    Neuromuscular disorder (HCC)    stenosis   Palpitations    Pneumonia 1990   PONV (postoperative nausea and vomiting)    Right hip pain    Stenosis of cervical spine    Thyroid  disease     Surgical History: Past Surgical History:  Procedure Laterality Date   ANTERIOR CERVICAL DECOMP/DISCECTOMY FUSION N/A 06/02/2020   Procedure: ANTERIOR CERVICAL DECOMPRESSION/DISCECTOMY FUSIONCERVICAL FOUR- CERVICAL FIVE, CERVICAL FIVE- CERVICAL SIX;  Surgeon: Daniel Scott, Daniel BROCKS, DO;  [facility]: [facility] OR;  Service: Neurosurgery;  Laterality: N/A;  ANTERIOR CERVICAL DECOMPRESSION/DISCECTOMY FUSIONCERVICAL FOUR- CERVICAL FIVE, CERVICAL FIVE- CERVICAL SIX   CARDIAC CATHETERIZATION  1996; 2006   [facility]   COLONOSCOPY WITH PROPOFOL  N/A 12/08/2022   Procedure: COLONOSCOPY WITH PROPOFOL ;  Surgeon: Daniel Carlin POUR, DO;  [facility];  Service: Endoscopy;  Laterality: N/A;  10:00 am , asa 3   HYDROCELE EXCISION     LAPAROSCOPIC GASTRIC BANDING     LAPAROSCOPIC ROUX-EN-Y GASTRIC BYPASS WITH UPPER  ENDOSCOPY AND REMOVAL OF LAP BAND  02/2012   LEFT HEART CATH AND CORONARY ANGIOGRAPHY N/A 04/21/2022   Procedure: LEFT HEART CATH AND CORONARY ANGIOGRAPHY;  Surgeon: Daniel Victory ORN, MD;  [facility] INVAS[facility] LAB;  Service: Cardiovascular;  Laterality: N/A;   ligament replacement Right    right hand   POLYPECTOMY  12/08/2022   Procedure: POLYPECTOMY;  Surgeon: Daniel Carlin POUR, DO;  [facility];  Service: Endoscopy;;   TONSILLECTOMY      Home Medications:  Allergies as of 07/03/2024       Reactions   Sulfa Antibiotics Rash        Medication List        Accurate as of July 03, 2024  8:40 AM. If you have any questions, ask your nurse or doctor.          amLODipine  10 MG tablet Commonly known as: NORVASC  TAKE 1 TABLET BY MOUTH EVERY DAY   APPLE CIDER VINEGAR PO Take 450 mg by mouth 2 (two) times daily.   aspirin  EC 325 MG tablet Take 325 mg by mouth daily.   atorvastatin  80 MG tablet Commonly known as: LIPITOR  TAKE 1 TABLET BY MOUTH EVERY DAY   cholecalciferol 25 MCG (1000 UNIT) tablet Commonly known as: VITAMIN D3 Take 1,000 Units by mouth daily.   CINNAMON PO Take 1,000 mg by mouth 2 (two) times daily.   clotrimazole -betamethasone   cream Commonly known as: LOTRISONE  Apply 1 Application topically daily.   Garlic 1000 MG Caps Take 1,000 mg by mouth 2 (two) times daily.   Jardiance  25 MG Tabs tablet Generic drug: empagliflozin  TAKE 1 TABLET BY MOUTH EVERY DAY BEFORE BREAKFAST   Krill Oil 500 MG Caps Take 500 mg by mouth daily.   levothyroxine  75 MCG tablet Commonly known as: SYNTHROID  TAKE 1 TABLET BY MOUTH EVERY DAY BEFORE BREAKFAST   lisinopril  20 MG tablet Commonly known as: ZESTRIL  TAKE 1 TABLET BY MOUTH EVERY DAY   metFORMIN  500 MG 24 hr tablet Commonly known as: GLUCOPHAGE -XR Take 2 tablets (1,000 mg total) by mouth 2 (two) times daily with a meal.   methocarbamol  500 MG tablet Commonly known as: ROBAXIN  TAKE 1 TABLET BY  MOUTH EVERY 6 HOURS AS NEEDED FOR MUSCLE SPASMS.   metoprolol  succinate 25 MG 24 hr tablet Commonly known as: Toprol  XL Take 1 tablet (25 mg total) by mouth daily.   MULTIVITAMIN ADULT PO Take 1 tablet by mouth daily.   nitroGLYCERIN  0.4 MG SL tablet Commonly known as: NITROSTAT  PLACE 1 TABLET UNDER THE TONGUE EVERY 5 MINUTES AS NEEDED FOR CHEST PAIN.   olopatadine 0.1 % ophthalmic solution Commonly known as: PATANOL Place 1 drop into both eyes daily as needed for allergies.   omeprazole  20 MG capsule Commonly known as: PRILOSEC Take 20 mg by mouth daily.   psyllium 95 % Pack Commonly known as: HYDROCIL/METAMUCIL Take 1 packet by mouth daily.   QC TUMERIC COMPLEX PO Take 1 capsule by mouth in the morning and at bedtime.   spironolactone  50 MG tablet Commonly known as: Aldactone  Take 1 tablet (50 mg total) by mouth daily.   tirzepatide  10 MG/0.5ML Pen Commonly known as: MOUNJARO  Inject 10 mg into the skin once a week.        Allergies:  Allergies  Allergen Reactions   Sulfa Antibiotics Rash    Family History: Family History  Problem Relation Age of Onset   Vascular Disease Mother    Cancer Mother        Bone marrow   Kidney disease Mother    Thyroid  disease Mother    Hypertension Mother    Diabetes Mother    Depression Mother    Coronary artery disease Father    Colon cancer Father    Heart disease Father    Hyperlipidemia Father    Hypertension Father    Sudden death Father    Breast cancer Maternal Grandmother    Hypertension Maternal Grandmother    Stroke Maternal Grandmother    Lung cancer Maternal Grandfather    Diabetes Maternal Grandfather    Heart disease Maternal Grandfather    Hypertension Maternal Grandfather    Stroke Maternal Grandfather    Hypertension Paternal Grandmother    Heart disease Paternal Grandfather    Hypertension Paternal Grandfather    Spina bifida Daughter    Depression Son    Anxiety disorder Son    Hypertension  Son    Autism Son    Bipolar disorder Son    Autism Son     Social History:  reports that he quit smoking about 15 years ago. His smoking use included cigarettes. He has never used smokeless tobacco. He reports current alcohol use. He reports current drug use. Drug: Marijuana.  ROS: All other review of systems were reviewed and are negative except what is noted above in HPI  Physical Exam: BP 118/74   Pulse (!) 52  Constitutional:  Alert and oriented, No acute distress. HEENT: [place] AT, moist mucus membranes.  Trachea midline, no masses. Cardiovascular: No clubbing, cyanosis, or edema. Respiratory: Normal respiratory effort, no increased work of breathing. GI: Abdomen is soft, nontender, nondistended, no abdominal masses GU: No CVA tenderness.  Lymph: No cervical or inguinal lymphadenopathy. Skin: No rashes, bruises or suspicious lesions. Neurologic: Grossly intact, no focal deficits, moving all 4 extremities. Psychiatric: Normal mood and affect.  Laboratory Data: Lab Results  Component Value Date   WBC 5.5 12/06/2023   HGB 14.9 12/06/2023   HCT 43.9 12/06/2023   MCV 94 12/06/2023   PLT 243 12/06/2023    Lab Results  Component Value Date   CREATININE 0.85 12/06/2023    No results found for: PSA  Lab Results  Component Value Date   TESTOSTERONE  447 12/06/2023    Lab Results  Component Value Date   HGBA1C 6.4 (H) 06/12/2024    Urinalysis    Component Value Date/Time   APPEARANCEUR Clear 01/01/2024 0930   GLUCOSEU 3+ (A) 01/01/2024 0930   BILIRUBINUR Negative 01/01/2024 0930   KETONESUR trace (5) (A) 01/15/2023 0815   PROTEINUR Negative 01/01/2024 0930   UROBILINOGEN 0.2 01/15/2023 0815   NITRITE Negative 01/01/2024 0930   LEUKOCYTESUR Negative 01/01/2024 0930    Lab Results  Component Value Date   LABMICR See below: 01/01/2024   WBCUA None seen 01/01/2024   LABEPIT 0-10 01/01/2024   BACTERIA None seen 01/01/2024    Pertinent Imaging:  Results  for orders placed in visit on 04/21/23  DG Abd 1 View  Narrative CLINICAL DATA:  Right lower quadrant abdominal pain  EXAM: ABDOMEN - 1 VIEW  COMPARISON:  08/25/2020  FINDINGS: Lap band noted, with band orientation at the 1 o'clock-7 o'clock angulation. No visible discontinuity in the tubing.  Unremarkable bowel gas pattern. Lower thoracic and lumbar spondylosis. Stable vascular calcifications in the anatomic pelvis.  IMPRESSION: 1. Lap band in place. 2. Lower thoracic and lumbar spondylosis.   Electronically Signed By: Ryan Salvage M.D. On: 04/21/2023 10:45  No results found for this or any previous visit.  No results found for this or any previous visit.  No results found for this or any previous visit.  No results found for this or any previous visit.  No results found for this or any previous visit.  No results found for this or any previous visit.  No results found for this or any previous visit.   Assessment & Plan:    1. Elevated PSA (Primary) Followup 6 months with PSA  2. Microscopic hematuria resolved - Urinalysis, Routine w reflex microscopic  3. BPH with obstruction/lower urinary tract symptoms Patient defers therapy at this time  4. Nocturia Patient defers therapy at thist ime   No follow-ups on file.  Belvie Clara, MD  [facility] [place]

## 2024-07-08 ENCOUNTER — Encounter: Payer: Self-pay | Admitting: Radiology

## 2024-07-12 ENCOUNTER — Ambulatory Visit: Admitting: Orthopedic Surgery

## 2024-07-12 ENCOUNTER — Other Ambulatory Visit: Payer: Self-pay

## 2024-07-12 ENCOUNTER — Encounter: Payer: Self-pay | Admitting: Orthopedic Surgery

## 2024-07-12 VITALS — BP 113/74 | HR 73 | Ht 69.5 in | Wt 311.0 lb

## 2024-07-12 DIAGNOSIS — M25511 Pain in right shoulder: Secondary | ICD-10-CM

## 2024-07-12 DIAGNOSIS — G8929 Other chronic pain: Secondary | ICD-10-CM

## 2024-07-12 DIAGNOSIS — M25512 Pain in left shoulder: Secondary | ICD-10-CM

## 2024-07-12 NOTE — Patient Instructions (Signed)

## 2024-07-12 NOTE — Progress Notes (Signed)
 New Patient Visit  Assessment: Daniel Scott is a 63 y.o. male with the following: 1. Chronic pain of both shoulders  Plan: Glendia LITTIE Pride has had pain in both shoulders for several years.  Right is currently worse than the left.  He states the left is tolerable, and does not need treatment currently.  He has positive Jobe's, as well as weakness in the supraspinatus.  No specific injury.  Radiograph demonstrates some loss of glenohumeral joint space.  It is possible that the arthritis in the right shoulder is worse than demonstrated on x-ray.  After discussing treatment options, he is interested in proceeding with an injection.  This was completed in clinic today.  Procedure note injection - Right shoulder    Verbal consent was obtained to inject the right shoulder, subacromial space Timeout was completed to confirm the site of injection.   The skin was prepped with alcohol and ethyl chloride was sprayed at the injection site.  A 21-gauge needle was used to inject 40 mg of Depo-Medrol  and 1% lidocaine  (4 cc) into the subacromial space of the right shoulder using a posterolateral approach.  There were no complications.  A sterile bandage was applied.    Follow-up: Return if symptoms worsen or fail to improve.  Subjective:  Chief Complaint  Patient presents with   Shoulder Pain    R > L for yrs. Started in L late 80's but had lost strength and has more pain more frequently in the R   NDC:[address]-1    History of Present Illness: Daniel Scott is a 63 y.o. male who has been referred by Annabella Search, FNP for evaluation of bilateral shoulder pain.  He states has had pain in both shoulders for years.  No specific injury.  Currently, the left shoulder is not bothering him.  However, he continues to have pain in the right shoulder.  Pain is progressively worsening.  He takes medications occasionally.  No recent injection.  He does report having an injection in his left shoulder many years  ago.  He had a good response for short period of time.  He takes medications occasionally.  He has not worked with physical therapy.  No specific injuries.    Review of Systems: No fevers or chills No numbness or tingling No chest pain No shortness of breath No bowel or bladder dysfunction No GI distress No headaches   Medical History:  Past Medical History:  Diagnosis Date   Anxiety    Arthritis    hands and feet   Back pain    DDD (degenerative disc disease), cervical    Depression    Diabetes mellitus without complication (HCC)    type 2   Edema of both lower extremities    Fatty liver    GERD (gastroesophageal reflux disease)    Hiatal hernia    Hyperlipidemia    Hypertension    Hypothyroidism    Joint pain    Morbid obesity (HCC)    Neuromuscular disorder (HCC)    stenosis   Palpitations    Pneumonia 1990   PONV (postoperative nausea and vomiting)    Right hip pain    Stenosis of cervical spine    Thyroid  disease     Past Surgical History:  Procedure Laterality Date   ANTERIOR CERVICAL DECOMP/DISCECTOMY FUSION N/A 06/02/2020   Procedure: ANTERIOR CERVICAL DECOMPRESSION/DISCECTOMY FUSIONCERVICAL FOUR- CERVICAL FIVE, CERVICAL FIVE- CERVICAL SIX;  Surgeon: Carollee Lani BROCKS, DO;  Location: [facility] OR;  Service:  Neurosurgery;  Laterality: N/A;  ANTERIOR CERVICAL DECOMPRESSION/DISCECTOMY FUSIONCERVICAL FOUR- CERVICAL FIVE, CERVICAL FIVE- CERVICAL SIX   CARDIAC CATHETERIZATION  1996; 2006   [facility]   COLONOSCOPY WITH PROPOFOL  N/A 12/08/2022   Procedure: COLONOSCOPY WITH PROPOFOL ;  Surgeon: Cindie Carlin POUR, DO;  [facility];  Service: Endoscopy;  Laterality: N/A;  10:00 am , asa 3   HYDROCELE EXCISION     LAPAROSCOPIC GASTRIC BANDING     LAPAROSCOPIC ROUX-EN-Y GASTRIC BYPASS WITH UPPER ENDOSCOPY AND REMOVAL OF LAP BAND  02/2012   LEFT HEART CATH AND CORONARY ANGIOGRAPHY N/A 04/21/2022   Procedure: LEFT HEART CATH AND CORONARY  ANGIOGRAPHY;  Surgeon: Claudene Victory ORN, MD;  [facility] INV[facility];  Service: Cardiovascular;  Laterality: N/A;   ligament replacement Right    right hand   POLYPECTOMY  12/08/2022   Procedure: POLYPECTOMY;  Surgeon: Cindie Carlin POUR, DO;  [facility];  Service: Endoscopy;;   TONSILLECTOMY      Family History  Problem Relation Age of Onset   Vascular Disease Mother    Cancer Mother        Bone marrow   Kidney disease Mother    Thyroid  disease Mother    Hypertension Mother    Diabetes Mother    Depression Mother    Coronary artery disease Father    Colon cancer Father    Heart disease Father    Hyperlipidemia Father    Hypertension Father    Sudden death Father    Breast cancer Maternal Grandmother    Hypertension Maternal Grandmother    Stroke Maternal Grandmother    Lung cancer Maternal Grandfather    Diabetes Maternal Grandfather    Heart disease Maternal Grandfather    Hypertension Maternal Grandfather    Stroke Maternal Grandfather    Hypertension Paternal Grandmother    Heart disease Paternal Grandfather    Hypertension Paternal Grandfather    Spina bifida Daughter    Depression Son    Anxiety disorder Son    Hypertension Son    Autism Son    Bipolar disorder Son    Autism Son    Social History   Tobacco Use   Smoking status: Former    Current packs/day: 0.00    Types: Cigarettes    Quit date: 05/2009    Years since quitting: 15.1   Smokeless tobacco: Never  Vaping Use   Vaping status: Never Used  Substance Use Topics   Alcohol use: Yes    Comment: Couple times a week   Drug use: Yes    Types: Marijuana    Comment: occ    Allergies  Allergen Reactions   Sulfa Antibiotics Rash    Current Meds  Medication Sig   amLODipine  (NORVASC ) 10 MG tablet TAKE 1 TABLET BY MOUTH EVERY DAY   APPLE CIDER VINEGAR PO Take 450 mg by mouth 2 (two) times daily.   aspirin  325 MG EC tablet Take 325 mg by mouth daily.   atorvastatin  (LIPITOR ) 80  MG tablet TAKE 1 TABLET BY MOUTH EVERY DAY   cholecalciferol (VITAMIN D3) 25 MCG (1000 UNIT) tablet Take 1,000 Units by mouth daily.   CINNAMON PO Take 1,000 mg by mouth 2 (two) times daily.   clotrimazole -betamethasone  (LOTRISONE ) cream Apply 1 Application topically daily.   empagliflozin  (JARDIANCE ) 25 MG TABS tablet TAKE 1 TABLET BY MOUTH EVERY DAY BEFORE BREAKFAST   Garlic 1000 MG CAPS Take 1,000 mg by mouth 2 (two) times  daily.   Krill Oil 500 MG CAPS Take 500 mg by mouth daily.   levothyroxine  (SYNTHROID ) 75 MCG tablet TAKE 1 TABLET BY MOUTH EVERY DAY BEFORE BREAKFAST   lisinopril  (ZESTRIL ) 20 MG tablet TAKE 1 TABLET BY MOUTH EVERY DAY   metFORMIN  (GLUCOPHAGE -XR) 500 MG 24 hr tablet Take 2 tablets (1,000 mg total) by mouth 2 (two) times daily with a meal.   methocarbamol  (ROBAXIN ) 500 MG tablet TAKE 1 TABLET BY MOUTH EVERY 6 HOURS AS NEEDED FOR MUSCLE SPASMS.   metoprolol  succinate (TOPROL  XL) 25 MG 24 hr tablet Take 1 tablet (25 mg total) by mouth daily.   Multiple Vitamin (MULTIVITAMIN ADULT PO) Take 1 tablet by mouth daily.    nitroGLYCERIN  (NITROSTAT ) 0.4 MG SL tablet PLACE 1 TABLET UNDER THE TONGUE EVERY 5 MINUTES AS NEEDED FOR CHEST PAIN.   olopatadine (PATANOL) 0.1 % ophthalmic solution Place 1 drop into both eyes daily as needed for allergies.   omeprazole  (PRILOSEC) 20 MG capsule Take 20 mg by mouth daily.   psyllium (HYDROCIL/METAMUCIL) 95 % PACK Take 1 packet by mouth daily.   spironolactone  (ALDACTONE ) 50 MG tablet Take 1 tablet (50 mg total) by mouth daily.   tirzepatide  (MOUNJARO ) 10 MG/0.5ML Pen Inject 10 mg into the skin once a week.   Turmeric (QC TUMERIC COMPLEX PO) Take 1 capsule by mouth in the morning and at bedtime.    Objective: BP 113/74   Pulse 73   Ht 5' 9.5 (1.765 m)   Wt (!) 311 lb (141.1 kg)   BMI 45.27 kg/m   Physical Exam:  General: Alert and oriented. and No acute distress. Gait: Normal gait.  Bilateral shoulders without deformity.  No  tenderness to palpation within the left shoulder.  Mild anterior tenderness in the right shoulder.  130 degrees of forward flexion.  Internal rotation to lumbar spine.  Minimal discomfort with infraspinatus strength testing.  Positive Jobes.  4/5 strength in the supraspinatus.  Tenderness palpation over the bicipital groove.  IMAGING: I personally ordered and reviewed the following images  X-rays of bilateral shoulders were obtained in clinic today.  No acute injuries noted.  Mild loss of glenohumeral joint space of the right.  Minimal loss of glenohumeral joint space on the left.  No evidence of proximal humeral migration.  No bony lesions.  Impression: Bilateral shoulder x-rays without acute injury; mild loss of joint space in the right shoulder   New Medications:  No orders of the defined types were placed in this encounter.     Oneil DELENA Horde, MD  07/12/2024 1:45 PM

## 2024-07-16 ENCOUNTER — Ambulatory Visit: Admitting: Professional Counselor

## 2024-07-16 DIAGNOSIS — F339 Major depressive disorder, recurrent, unspecified: Secondary | ICD-10-CM

## 2024-07-16 NOTE — Patient Instructions (Signed)
 If your symptoms worsen or you have thoughts of suicide/homicide, PLEASE SEEK IMMEDIATE MEDICAL ATTENTION.  You may always call:   National Suicide Hotline: 988 or 539 667 3577 [place] Crisis Line: 458 569 7915 Crisis Recovery in [place]: [phone]     These are available 24 hours a day, 7 days a week.

## 2024-07-16 NOTE — BH Specialist Note (Addendum)
 Collaborative Care Initial Assessment  Session Start time: 10:30   Session End time: 11:30  Total time in minutes: 60    Type of Contact:  Face to Face Patient consent obtained:  Yes Types of Service: Collaborative care  Summary  Patient is a 64 yo male being referred to collaborative care by his pcp for anxiety and depression. Patient was engaged and cooperative during session.   Reason for referral in patient/family's own words:  I find myself getting mad at things that didn't use to bother me  Patient's goal for today's visit: Not be angry any more  History of Present illness:   The patient is a 63 year old male presenting for a collaborative care assessment. He reports a long history of depression and anxiety beginning in childhood. He was treated with Abilify for many years and described 2007 as his most difficult period, during which he experienced severe depressive symptoms and suicidal thoughts. He eventually stabilized and discontinued medication, managing relatively well without pharmacologic support for the past ten years.  In 2022, following his retirement, he began noticing a gradual decline in mood and functioning, including decreased energy, reduced interest in usual activities, and a diminishing sense of purpose. He reports increased thoughts about mortality, completing his will and estate planning, and more recently, experiencing irritability, frustration, and anger--particularly at societal issues and things that previously did not upset him. He describes low motivation and occasional fleeting suicidal thoughts, such as questioning his purpose, but denies any current plan or intent. He is considered at mild risk at this time.  The patient expresses motivation to engage in treatment and is open to considering medication management. He reports having a supportive wife, children, and grandchildren, which provide him with a sense of hope. He has never been hospitalized,  has no history of suicide attempts or violence, and drinks approximately three to four beers per day. He also reports occasional THC use and acknowledges a history of heavier alcohol use in the past, though he believes it is now under control. There is a reported history of trauma, though he did not elaborate during this session.  Overall, the patient presents as motivated for care and seeking improvement in mood regulation and irritability. The plan includes continued assessment, supportive counseling, and collaboration with his primary care provider to discuss possible medication options.   Clinical Assessment   PHQ-9 Assessments:    07/16/2024   10:45 AM 06/12/2024    9:35 AM 03/07/2024    8:00 AM 10/23/2023    8:44 AM 08/07/2023    8:14 AM  Depression screen PHQ 2/9  Decreased Interest 3 2 1 2 1   Down, Depressed, Hopeless 1 1 0 1 1  PHQ - 2 Score 4 3 1 3 2   Altered sleeping 0 1 1 1 2   Tired, decreased energy 2 2 1 1 1   Change in appetite 0 0 0 0 0  Feeling bad or failure about yourself  2 1 1 1 1   Trouble concentrating 1 0 0 1 0  Moving slowly or fidgety/restless 0 0 0 0 0  Suicidal thoughts 1 0 0 0 0  PHQ-9 Score 10 7  4  7  6    Difficult doing work/chores Somewhat difficult Somewhat difficult Not difficult at all Somewhat difficult Somewhat difficult     Data saved with a previous flowsheet row definition    GAD-7 Assessments:    07/16/2024   10:49 AM 06/12/2024    9:36 AM 03/07/2024  8:01 AM 10/23/2023    8:45 AM  GAD 7 : Generalized Anxiety Score  Nervous, Anxious, on Edge 2 0 1 1  Control/stop worrying 1 1 1 1   Worry too much - different things 1 2 1 1   Trouble relaxing 0 1 1 1   Restless 0 0 0 0  Easily annoyed or irritable 2 2 1 1   Afraid - awful might happen 2 2 1 1   Total GAD 7 Score 8 8 6 6   Anxiety Difficulty Somewhat difficult Somewhat difficult Somewhat difficult Somewhat difficult     Social History:  Household: Lives with wife Marital status:  Married Number of Children: 4 Employment: Retired Programme Researcher, Broadcasting/film/video: High school  Psychiatric Review of systems: Insomnia: Denies Changes in appetite: Denies Decreased need for sleep: No Family history of bipolar disorder: No Hallucinations: No   Paranoia: No    Psychotropic medications: Current medications: None Patient taking medications as prescribed: No Side effects reported: No  Current medications (medication list) Current Outpatient Medications on File Prior to Visit  Medication Sig Dispense Refill   amLODipine  (NORVASC ) 10 MG tablet TAKE 1 TABLET BY MOUTH EVERY DAY 30 tablet 5   APPLE CIDER VINEGAR PO Take 450 mg by mouth 2 (two) times daily.     aspirin  325 MG EC tablet Take 325 mg by mouth daily.     atorvastatin  (LIPITOR ) 80 MG tablet TAKE 1 TABLET BY MOUTH EVERY DAY 30 tablet 5   cholecalciferol (VITAMIN D3) 25 MCG (1000 UNIT) tablet Take 1,000 Units by mouth daily.     CINNAMON PO Take 1,000 mg by mouth 2 (two) times daily.     clotrimazole -betamethasone  (LOTRISONE ) cream Apply 1 Application topically daily. 90 g 0   empagliflozin  (JARDIANCE ) 25 MG TABS tablet TAKE 1 TABLET BY MOUTH EVERY DAY BEFORE BREAKFAST 90 tablet 1   Garlic 1000 MG CAPS Take 1,000 mg by mouth 2 (two) times daily.     Krill Oil 500 MG CAPS Take 500 mg by mouth daily.     levothyroxine  (SYNTHROID ) 75 MCG tablet TAKE 1 TABLET BY MOUTH EVERY DAY BEFORE BREAKFAST 90 tablet 2   lisinopril  (ZESTRIL ) 20 MG tablet TAKE 1 TABLET BY MOUTH EVERY DAY 90 tablet 3   metFORMIN  (GLUCOPHAGE -XR) 500 MG 24 hr tablet Take 2 tablets (1,000 mg total) by mouth 2 (two) times daily with a meal. 360 tablet 3   methocarbamol  (ROBAXIN ) 500 MG tablet TAKE 1 TABLET BY MOUTH EVERY 6 HOURS AS NEEDED FOR MUSCLE SPASMS. 60 tablet 3   metoprolol  succinate (TOPROL  XL) 25 MG 24 hr tablet Take 1 tablet (25 mg total) by mouth daily. 90 tablet 3   Multiple Vitamin (MULTIVITAMIN ADULT PO) Take 1 tablet by mouth daily.      nitroGLYCERIN   (NITROSTAT ) 0.4 MG SL tablet PLACE 1 TABLET UNDER THE TONGUE EVERY 5 MINUTES AS NEEDED FOR CHEST PAIN. 25 tablet 9   olopatadine (PATANOL) 0.1 % ophthalmic solution Place 1 drop into both eyes daily as needed for allergies.     omeprazole  (PRILOSEC) 20 MG capsule Take 20 mg by mouth daily.     psyllium (HYDROCIL/METAMUCIL) 95 % PACK Take 1 packet by mouth daily.     spironolactone  (ALDACTONE ) 50 MG tablet Take 1 tablet (50 mg total) by mouth daily. 90 tablet 3   tirzepatide  (MOUNJARO ) 10 MG/0.5ML Pen Inject 10 mg into the skin once a week. 6 mL 3   Turmeric (QC TUMERIC COMPLEX PO) Take 1 capsule by mouth in the  morning and at bedtime.     No current facility-administered medications on file prior to visit.    Psychiatric History: Past psychiatry diagnosis: Depression Patient currently being seen by therapist/psychiatrist: Denies Prior Suicide Attempts: Denies Past psychiatry Hospitalization(s): Denies Past history of violence: Denies  Traumatic Experiences: History or current traumatic events (natural disaster, house fire, etc.)? no History or current physical trauma?  no History or current emotional trauma?  yes History or current sexual trauma?  no History or current domestic or intimate partner violence?  no PTSD symptoms if any traumatic experiences no  Alcohol and/or Substance Use History   Tobacco Alcohol Other substances  Current use  3-4 beers a day THC  Past use  12 pack a day    Past treatment      Withdrawal Potential: Moderate  Self-harm Behaviors Risk Assessment Self-harm risk factors: Thoughts of suicide but no plan or intent Patient endorses recent thoughts of harming self: Yes [place] Suicide Severity Rating Scale:  Flowsheet Row Integrated Behavioral Health from 07/16/2024 in [facility] from 01/15/2023 in [facility] Admission (Discharged) from 12/08/2022 in [facility]  C-SSRS RISK  CATEGORY No Risk No Risk No Risk     Guns in the home: Denies   Protective factors: Wife, kids,   Danger to Others Risk Assessment Danger to others risk factors:  None Patient endorses recent thoughts of harming others:  Denies  Consulting Civil Engineer discussed emergency crisis plan with client and provided local emergency services resources.  Mental status exam:   General Appearance Daniel Scott:  Neat Eye Contact:  Good Motor Behavior:  Normal Speech:  Normal Level of Consciousness:  Alert Mood:  Depressed Affect:  Appropriate Anxiety Level:  Minimal Thought Process:  Coherent Thought Content:  WNL Perception:  Normal Judgment:  Good Insight:  Present  Diagnosis:   Goals: Increase healthy adjustment to current life circumstances   Interventions: Behavioral Activation   Follow-up Plan: Two weeks

## 2024-07-23 ENCOUNTER — Ambulatory Visit: Admitting: Professional Counselor

## 2024-07-23 DIAGNOSIS — F339 Major depressive disorder, recurrent, unspecified: Secondary | ICD-10-CM

## 2024-07-23 NOTE — BH Specialist Note (Signed)
 [place] Follow-up  MRN: [id number] NAME: Daniel Scott Date: 07/23/24  Start time: Start Time: 0930 End time: Stop Time: 1000 Total time: Total Time in Minutes (Visit): 30 Call number: Visit Number: 2- Second Visit  Reason for call today:  The patient is a 63 year old male who presented for a collaborative care follow-up session. He reports ongoing shoulder pain, low energy, and feeling somewhat "off" since transitioning into retirement. He continues to struggle with adjusting to the changes in his daily structure and sense of purpose. He also notes oversleeping, increased eating, and general low motivation. Although he has some medical concerns, his primary difficulties at this time appear to be related to depressive symptoms.  During the visit, we reviewed the collaborative care process, emphasizing the assessment phase, the psychiatric consultation, and the development of a treatment plan. The psychiatric consultation is scheduled for later this week, and the patient is open to engaging fully in the process. Current screening scores indicate a PHQ-9 of 10 and a GAD-7 of 8, consistent with mild-moderate depressive and anxiety symptoms.  The patient expressed a preference to begin with counseling before starting any medication. We discussed that treatment decisions will be guided by the upcoming psychiatric consultation, and he is agreeable to considering medication if recommended. The plan is to complete the consultation, review the psychiatrist's recommendations together, and determine the next steps in his care.   PHQ-9 Scores:     07/16/2024   10:45 AM 06/12/2024    9:35 AM 03/07/2024    8:00 AM 10/23/2023    8:44 AM 08/07/2023    8:14 AM  Depression screen PHQ 2/9  Decreased Interest 3 2 1 2 1   Down, Depressed, Hopeless 1 1 0 1 1  PHQ - 2 Score 4 3 1 3 2   Altered sleeping 0 1 1 1 2   Tired, decreased energy 2 2 1 1 1   Change in appetite 0 0 0 0 0  Feeling bad or failure about  yourself  2 1 1 1 1   Trouble concentrating 1 0 0 1 0  Moving slowly or fidgety/restless 0 0 0 0 0  Suicidal thoughts 1 0 0 0 0  PHQ-9 Score 10 7  4  7  6    Difficult doing work/chores Somewhat difficult Somewhat difficult Not difficult at all Somewhat difficult Somewhat difficult     Data saved with a previous flowsheet row definition   GAD-7 Scores:     07/16/2024   10:49 AM 06/12/2024    9:36 AM 03/07/2024    8:01 AM 10/23/2023    8:45 AM  GAD 7 : Generalized Anxiety Score  Nervous, Anxious, on Edge 2 0 1 1  Control/stop worrying 1 1 1 1   Worry too much - different things 1 2 1 1   Trouble relaxing 0 1 1 1   Restless 0 0 0 0  Easily annoyed or irritable 2 2 1 1   Afraid - awful might happen 2 2 1 1   Total GAD 7 Score 8 8 6 6   Anxiety Difficulty Somewhat difficult Somewhat difficult Somewhat difficult Somewhat difficult    Stress Current stressors:  lack of structure Sleep:  sleeping too much Appetite:  overeating Coping ability:  Fair Patient taking medications as prescribed:  na  Current medications:  Outpatient Encounter Medications as of 07/23/2024  Medication Sig   amLODipine  (NORVASC ) 10 MG tablet TAKE 1 TABLET BY MOUTH EVERY DAY   APPLE CIDER VINEGAR PO Take 450 mg by mouth 2 (two)  times daily.   aspirin  325 MG EC tablet Take 325 mg by mouth daily.   atorvastatin  (LIPITOR ) 80 MG tablet TAKE 1 TABLET BY MOUTH EVERY DAY   cholecalciferol (VITAMIN D3) 25 MCG (1000 UNIT) tablet Take 1,000 Units by mouth daily.   CINNAMON PO Take 1,000 mg by mouth 2 (two) times daily.   clotrimazole -betamethasone  (LOTRISONE ) cream Apply 1 Application topically daily.   empagliflozin  (JARDIANCE ) 25 MG TABS tablet TAKE 1 TABLET BY MOUTH EVERY DAY BEFORE BREAKFAST   Garlic 1000 MG CAPS Take 1,000 mg by mouth 2 (two) times daily.   Krill Oil 500 MG CAPS Take 500 mg by mouth daily.   levothyroxine  (SYNTHROID ) 75 MCG tablet TAKE 1 TABLET BY MOUTH EVERY DAY BEFORE BREAKFAST   lisinopril  (ZESTRIL )  20 MG tablet TAKE 1 TABLET BY MOUTH EVERY DAY   metFORMIN  (GLUCOPHAGE -XR) 500 MG 24 hr tablet Take 2 tablets (1,000 mg total) by mouth 2 (two) times daily with a meal.   methocarbamol  (ROBAXIN ) 500 MG tablet TAKE 1 TABLET BY MOUTH EVERY 6 HOURS AS NEEDED FOR MUSCLE SPASMS.   metoprolol  succinate (TOPROL  XL) 25 MG 24 hr tablet Take 1 tablet (25 mg total) by mouth daily.   Multiple Vitamin (MULTIVITAMIN ADULT PO) Take 1 tablet by mouth daily.    nitroGLYCERIN  (NITROSTAT ) 0.4 MG SL tablet PLACE 1 TABLET UNDER THE TONGUE EVERY 5 MINUTES AS NEEDED FOR CHEST PAIN.   olopatadine (PATANOL) 0.1 % ophthalmic solution Place 1 drop into both eyes daily as needed for allergies.   omeprazole  (PRILOSEC) 20 MG capsule Take 20 mg by mouth daily.   psyllium (HYDROCIL/METAMUCIL) 95 % PACK Take 1 packet by mouth daily.   spironolactone  (ALDACTONE ) 50 MG tablet Take 1 tablet (50 mg total) by mouth daily.   tirzepatide  (MOUNJARO ) 10 MG/0.5ML Pen Inject 10 mg into the skin once a week.   Turmeric (QC TUMERIC COMPLEX PO) Take 1 capsule by mouth in the morning and at bedtime.   No facility-administered encounter medications on file as of 07/23/2024.     Self-harm Behaviors Risk Assessment Self-harm risk factors:  Depression, retirement, past thoughts Patient endorses recent thoughts of harming self:  Denies   Danger to Others Risk Assessment Danger to others risk factors:  None Patient endorses recent thoughts of harming others:  Denies   Goals, Interventions and Follow-up Plan Goals: Increase healthy adjustment to current life circumstances Interventions: Behavioral Activation and CBT Cognitive Behavioral Therapy Follow-up Plan: 2 weeks   [name]

## 2024-07-26 ENCOUNTER — Telehealth (INDEPENDENT_AMBULATORY_CARE_PROVIDER_SITE_OTHER): Payer: Self-pay | Admitting: Professional Counselor

## 2024-07-26 DIAGNOSIS — F339 Major depressive disorder, recurrent, unspecified: Secondary | ICD-10-CM

## 2024-07-26 NOTE — BH Specialist Note (Signed)
 Virtual Behavioral Health Treatment Plan Team Note  MRN: [id number] NAME: Daniel Scott  DATE: 07/26/24  Start time: Start Time: 1115 End time: Stop Time: 1130 Total time: Total Time in Minutes (Visit): 15  Total number of Virtual BH Treatment Team Plan encounters: 1/4  Treatment Team Attendees: Sharlot Becker and Redell Corn  Collaborative Care Psychiatric Consultant Case Review   Assessment/Provisional Diagnosis 63 year old male with history of multiple medical issues. The patient is referred for anxiety and depression.   Provisional Diagnosis: # MDD, Recurrent, moderate     Recommendation 1. Recommend Lexapro  10 mg daily. 2. BH specialist to follow up.   Thank you for your consult. Please contact our collaborative care team for any questions or concerns.   I spent 20 minutes chart reviewing, discussing with [facility] Speicalist and documenting in the chart.  Diagnoses:    ICD-10-CM   1. Depression, recurrent  F33.9       Goals, Interventions and Follow-up Plan Goals: Increase healthy adjustment to current life circumstances Interventions: Behavioral Activation CBT Cognitive Behavioral Therapy Medication Management Recommendations: Lexapro  10 mg daily. Follow-up Plan: bi-weekly counseling  History of the present illness Presenting Problem/Current Symptoms:  The patient is a 63 year old male presenting for a collaborative care assessment. He reports a long history of depression and anxiety beginning in childhood. He was treated with Abilify for many years and described 2007 as his most difficult period, during which he experienced severe depressive symptoms and suicidal thoughts. He eventually stabilized and discontinued medication, managing relatively well without pharmacologic support for the past ten years.   In 2022, following his retirement, he began noticing a gradual decline in mood and functioning, including decreased energy, reduced interest in usual activities, and a  diminishing sense of purpose. He reports increased thoughts about mortality, completing his will and estate planning, and more recently, experiencing irritability, frustration, and anger--particularly at societal issues and things that previously did not upset him. He describes low motivation and occasional fleeting suicidal thoughts, such as questioning his purpose, but denies any current plan or intent. He is considered at mild risk at this time.   The patient expresses motivation to engage in treatment and is open to considering medication management. He reports having a supportive wife, children, and grandchildren, which provide him with a sense of hope. He has never been hospitalized, has no history of suicide attempts or violence, and drinks approximately three to four beers per day. He also reports occasional THC use and acknowledges a history of heavier alcohol use in the past, though he believes it is now under control. There is a reported history of trauma, though he did not elaborate during this session.   Overall, the patient presents as motivated for care and seeking improvement in mood regulation and irritability. The plan includes continued assessment, supportive counseling, and collaboration with his primary care provider to discuss possible medication options.   Screenings PHQ-9 Assessments:     07/16/2024   10:45 AM 06/12/2024    9:35 AM 03/07/2024    8:00 AM  Depression screen PHQ 2/9  Decreased Interest 3 2 1   Down, Depressed, Hopeless 1 1 0  PHQ - 2 Score 4 3 1   Altered sleeping 0 1 1  Tired, decreased energy 2 2 1   Change in appetite 0 0 0  Feeling bad or failure about yourself  2 1 1   Trouble concentrating 1 0 0  Moving slowly or fidgety/restless 0 0 0  Suicidal thoughts 1 0  0  PHQ-9 Score 10 7  4    Difficult doing work/chores Somewhat difficult Somewhat difficult Not difficult at all     Data saved with a previous flowsheet row definition   GAD-7 Assessments:      07/16/2024   10:49 AM 06/12/2024    9:36 AM 03/07/2024    8:01 AM 10/23/2023    8:45 AM  GAD 7 : Generalized Anxiety Score  Nervous, Anxious, on Edge 2 0 1 1  Control/stop worrying 1 1 1 1   Worry too much - different things 1 2 1 1   Trouble relaxing 0 1 1 1   Restless 0 0 0 0  Easily annoyed or irritable 2 2 1 1   Afraid - awful might happen 2 2 1 1   Total GAD 7 Score 8 8 6 6   Anxiety Difficulty Somewhat difficult Somewhat difficult Somewhat difficult Somewhat difficult    Past Medical History Past Medical History:  Diagnosis Date   Anxiety    Arthritis    hands and feet   Back pain    DDD (degenerative disc disease), cervical    Depression    Diabetes mellitus without complication (HCC)    type 2   Edema of both lower extremities    Fatty liver    GERD (gastroesophageal reflux disease)    Hiatal hernia    Hyperlipidemia    Hypertension    Hypothyroidism    Joint pain    Morbid obesity (HCC)    Neuromuscular disorder (HCC)    stenosis   Palpitations    Pneumonia 1990   PONV (postoperative nausea and vomiting)    Right hip pain    Stenosis of cervical spine    Thyroid  disease     Vital signs: There were no vitals filed for this visit.  Allergies:  Allergies as of 07/26/2024 - Review Complete 07/12/2024  Allergen Reaction Noted   Sulfa antibiotics Rash 10/15/2019    Medication History Current medications:  Outpatient Encounter Medications as of 07/26/2024  Medication Sig   amLODipine  (NORVASC ) 10 MG tablet TAKE 1 TABLET BY MOUTH EVERY DAY   APPLE CIDER VINEGAR PO Take 450 mg by mouth 2 (two) times daily.   aspirin  325 MG EC tablet Take 325 mg by mouth daily.   atorvastatin  (LIPITOR ) 80 MG tablet TAKE 1 TABLET BY MOUTH EVERY DAY   cholecalciferol (VITAMIN D3) 25 MCG (1000 UNIT) tablet Take 1,000 Units by mouth daily.   CINNAMON PO Take 1,000 mg by mouth 2 (two) times daily.   clotrimazole -betamethasone  (LOTRISONE ) cream Apply 1 Application topically daily.    empagliflozin  (JARDIANCE ) 25 MG TABS tablet TAKE 1 TABLET BY MOUTH EVERY DAY BEFORE BREAKFAST   Garlic 1000 MG CAPS Take 1,000 mg by mouth 2 (two) times daily.   Krill Oil 500 MG CAPS Take 500 mg by mouth daily.   levothyroxine  (SYNTHROID ) 75 MCG tablet TAKE 1 TABLET BY MOUTH EVERY DAY BEFORE BREAKFAST   lisinopril  (ZESTRIL ) 20 MG tablet TAKE 1 TABLET BY MOUTH EVERY DAY   metFORMIN  (GLUCOPHAGE -XR) 500 MG 24 hr tablet Take 2 tablets (1,000 mg total) by mouth 2 (two) times daily with a meal.   methocarbamol  (ROBAXIN ) 500 MG tablet TAKE 1 TABLET BY MOUTH EVERY 6 HOURS AS NEEDED FOR MUSCLE SPASMS.   metoprolol  succinate (TOPROL  XL) 25 MG 24 hr tablet Take 1 tablet (25 mg total) by mouth daily.   Multiple Vitamin (MULTIVITAMIN ADULT PO) Take 1 tablet by mouth daily.    nitroGLYCERIN  (NITROSTAT )  0.4 MG SL tablet PLACE 1 TABLET UNDER THE TONGUE EVERY 5 MINUTES AS NEEDED FOR CHEST PAIN.   olopatadine (PATANOL) 0.1 % ophthalmic solution Place 1 drop into both eyes daily as needed for allergies.   omeprazole  (PRILOSEC) 20 MG capsule Take 20 mg by mouth daily.   psyllium (HYDROCIL/METAMUCIL) 95 % PACK Take 1 packet by mouth daily.   spironolactone  (ALDACTONE ) 50 MG tablet Take 1 tablet (50 mg total) by mouth daily.   tirzepatide  (MOUNJARO ) 10 MG/0.5ML Pen Inject 10 mg into the skin once a week.   Turmeric (QC TUMERIC COMPLEX PO) Take 1 capsule by mouth in the morning and at bedtime.   No facility-administered encounter medications on file as of 07/26/2024.     Scribe for Treatment Team: [name]

## 2024-07-28 ENCOUNTER — Other Ambulatory Visit: Payer: Self-pay | Admitting: Family Medicine

## 2024-07-28 ENCOUNTER — Encounter: Payer: Self-pay | Admitting: Family Medicine

## 2024-07-28 DIAGNOSIS — E1165 Type 2 diabetes mellitus with hyperglycemia: Secondary | ICD-10-CM

## 2024-07-28 DIAGNOSIS — E782 Mixed hyperlipidemia: Secondary | ICD-10-CM

## 2024-07-28 DIAGNOSIS — I10 Essential (primary) hypertension: Secondary | ICD-10-CM

## 2024-07-28 DIAGNOSIS — I700 Atherosclerosis of aorta: Secondary | ICD-10-CM

## 2024-07-29 ENCOUNTER — Encounter: Payer: Self-pay | Admitting: Family Medicine

## 2024-07-29 DIAGNOSIS — F339 Major depressive disorder, recurrent, unspecified: Secondary | ICD-10-CM

## 2024-07-29 DIAGNOSIS — F419 Anxiety disorder, unspecified: Secondary | ICD-10-CM

## 2024-07-29 MED ORDER — ESCITALOPRAM OXALATE 10 MG PO TABS: 10.0000 mg | ORAL_TABLET | Freq: Every day | ORAL | 3 refills | Status: DC

## 2024-07-30 ENCOUNTER — Other Ambulatory Visit (HOSPITAL_COMMUNITY): Payer: Self-pay

## 2024-07-30 ENCOUNTER — Telehealth: Payer: Self-pay | Admitting: Pharmacy Technician

## 2024-07-30 ENCOUNTER — Encounter: Payer: Self-pay | Admitting: Family Medicine

## 2024-07-30 ENCOUNTER — Other Ambulatory Visit (HOSPITAL_BASED_OUTPATIENT_CLINIC_OR_DEPARTMENT_OTHER): Payer: Self-pay

## 2024-07-30 NOTE — Telephone Encounter (Signed)
 Pharmacy Patient Advocate Encounter   Received notification from Onbase that prior authorization for Mounjaro  10MG /0.5ML auto-injectors  is required/requested.   Insurance verification completed.   The patient is insured through [facility].   Per test claim: PA required; PA submitted to above mentioned insurance via Latent Key/confirmation #/EOC [id number] Status is pending

## 2024-07-30 NOTE — Telephone Encounter (Signed)
 Pharmacy Patient Advocate Encounter  Received notification from [facility] that Prior Authorization for Mounjaro  10MG /0.5ML auto-injectors  has been APPROVED from 07/30/24 to 07/30/25. Ran test claim, Copay is $45.00. This test claim was processed through [facility]- copay amounts may vary at other pharmacies due to pharmacy/plan contracts, or as the patient moves through the different stages of their insurance plan.   PA #/Case ID/Reference #: [id number]

## 2024-08-04 ENCOUNTER — Other Ambulatory Visit: Payer: Self-pay | Admitting: Family Medicine

## 2024-08-04 DIAGNOSIS — E039 Hypothyroidism, unspecified: Secondary | ICD-10-CM

## 2024-08-06 ENCOUNTER — Ambulatory Visit: Admitting: Professional Counselor

## 2024-08-12 DIAGNOSIS — D225 Melanocytic nevi of trunk: Secondary | ICD-10-CM | POA: Diagnosis not present

## 2024-08-12 DIAGNOSIS — L821 Other seborrheic keratosis: Secondary | ICD-10-CM | POA: Diagnosis not present

## 2024-08-13 ENCOUNTER — Ambulatory Visit: Admitting: Professional Counselor

## 2024-08-13 DIAGNOSIS — F339 Major depressive disorder, recurrent, unspecified: Secondary | ICD-10-CM

## 2024-08-13 NOTE — BH Specialist Note (Unsigned)
 [place] Follow-up  MRN: [id number] NAME: Daniel Scott Date: 08/13/24  Start time: Start Time: 1030 End time: Stop Time: 1100 Total time: Total Time in Minutes (Visit): 30 Call number: Visit Number: 4- Fourth Visit  Reason for call today:  Feeling overwhelmed with holidays started lexapro  2 weeks ago been tired the past few days   PHQ-9 Scores:     08/13/2024   10:39 AM 07/16/2024   10:45 AM 06/12/2024    9:35 AM 03/07/2024    8:00 AM 10/23/2023    8:44 AM  Depression screen PHQ 2/9  Decreased Interest 2 3 2 1 2   Down, Depressed, Hopeless 1 1 1  0 1  PHQ - 2 Score 3 4 3 1 3   Altered sleeping 1 0 1 1 1   Tired, decreased energy 2 2 2 1 1   Change in appetite 0 0 0 0 0  Feeling bad or failure about yourself  1 2 1 1 1   Trouble concentrating 1 1 0 0 1  Moving slowly or fidgety/restless 0 0 0 0 0  Suicidal thoughts 0 1 0 0 0  PHQ-9 Score 8 10 7  4  7    Difficult doing work/chores Somewhat difficult Somewhat difficult Somewhat difficult Not difficult at all Somewhat difficult     Data saved with a previous flowsheet row definition   GAD-7 Scores:     08/13/2024   10:40 AM 07/16/2024   10:49 AM 06/12/2024    9:36 AM 03/07/2024    8:01 AM  GAD 7 : Generalized Anxiety Score  Nervous, Anxious, on Edge 1 2 0 1  Control/stop worrying 1 1 1 1   Worry too much - different things 2 1 2 1   Trouble relaxing 1 0 1 1  Restless 0 0 0 0  Easily annoyed or irritable 2 2 2 1   Afraid - awful might happen 1 2 2 1   Total GAD 7 Score 8 8 8 6   Anxiety Difficulty Somewhat difficult Somewhat difficult Somewhat difficult Somewhat difficult    Stress Current stressors:  low energy and  Sleep:   Appetite:   Coping ability:   Patient taking medications as prescribed:    Current medications:  Outpatient Encounter Medications as of 08/13/2024  Medication Sig   amLODipine  (NORVASC ) 10 MG tablet TAKE 1 TABLET BY MOUTH EVERY DAY   APPLE CIDER VINEGAR PO Take 450 mg by mouth 2 (two) times daily.    aspirin  325 MG EC tablet Take 325 mg by mouth daily.   atorvastatin  (LIPITOR ) 80 MG tablet TAKE 1 TABLET BY MOUTH EVERY DAY   cholecalciferol (VITAMIN D3) 25 MCG (1000 UNIT) tablet Take 1,000 Units by mouth daily.   CINNAMON PO Take 1,000 mg by mouth 2 (two) times daily.   clotrimazole -betamethasone  (LOTRISONE ) cream Apply 1 Application topically daily.   empagliflozin  (JARDIANCE ) 25 MG TABS tablet TAKE 1 TABLET BY MOUTH EVERY DAY BEFORE BREAKFAST   escitalopram  (LEXAPRO ) 10 MG tablet Take 1 tablet (10 mg total) by mouth daily.   Garlic 1000 MG CAPS Take 1,000 mg by mouth 2 (two) times daily.   Krill Oil 500 MG CAPS Take 500 mg by mouth daily.   levothyroxine  (SYNTHROID ) 75 MCG tablet TAKE 1 TABLET BY MOUTH EVERY DAY BEFORE BREAKFAST   lisinopril  (ZESTRIL ) 20 MG tablet TAKE 1 TABLET BY MOUTH EVERY DAY   metFORMIN  (GLUCOPHAGE -XR) 500 MG 24 hr tablet Take 2 tablets (1,000 mg total) by mouth 2 (two) times daily with a meal.  methocarbamol  (ROBAXIN ) 500 MG tablet TAKE 1 TABLET BY MOUTH EVERY 6 HOURS AS NEEDED FOR MUSCLE SPASMS.   metoprolol  succinate (TOPROL  XL) 25 MG 24 hr tablet Take 1 tablet (25 mg total) by mouth daily.   Multiple Vitamin (MULTIVITAMIN ADULT PO) Take 1 tablet by mouth daily.    nitroGLYCERIN  (NITROSTAT ) 0.4 MG SL tablet PLACE 1 TABLET UNDER THE TONGUE EVERY 5 MINUTES AS NEEDED FOR CHEST PAIN.   olopatadine (PATANOL) 0.1 % ophthalmic solution Place 1 drop into both eyes daily as needed for allergies.   omeprazole  (PRILOSEC) 20 MG capsule Take 20 mg by mouth daily.   psyllium (HYDROCIL/METAMUCIL) 95 % PACK Take 1 packet by mouth daily.   spironolactone  (ALDACTONE ) 50 MG tablet Take 1 tablet (50 mg total) by mouth daily.   tirzepatide  (MOUNJARO ) 10 MG/0.5ML Pen Inject 10 mg into the skin once a week.   Turmeric (QC TUMERIC COMPLEX PO) Take 1 capsule by mouth in the morning and at bedtime.   No facility-administered encounter medications on file as of 08/13/2024.     Self-harm  Behaviors Risk Assessment Self-harm risk factors:   Patient endorses recent thoughts of harming self:    [place] Suicide Severity Rating Scale: Failed to redirect to the Timeline version of the REVFS SmartLink.   Danger to Others Risk Assessment Danger to others risk factors:   Patient endorses recent thoughts of harming others:    Dynamic Appraisal of Situational Aggression (DASA):      No data to display           Substance Use Assessment Patient recently consumed alcohol:    Alcohol Use Disorder Identification Test (AUDIT):     12/26/2022    8:48 AM 06/15/2023    1:45 PM 10/19/2023    8:58 AM 02/29/2024    8:09 AM  Alcohol Use Disorder Test (AUDIT)  1. How often do you have a drink containing alcohol? 3 3 3 3   2. How many drinks containing alcohol do you have on a typical day when you are drinking? 1 1 1 1   3. How often do you have six or more drinks on one occasion? 1 1 2 1   AUDIT-C Score 5 5  6  5    4. How often during the last year have you found that you were not able to stop drinking once you had started? 0 0 0 0  5. How often during the last year have you failed to do what was normally expected from you because of drinking? 0 1 0 0  6. How often during the last year have you needed a first drink in the morning to get yourself going after a heavy drinking session? 0 0 0 0  7. How often during the last year have you had a feeling of guilt of remorse after drinking? 0 0 0 0  8. How often during the last year have you been unable to remember what happened the night before because you had been drinking? 0 0 0 0  9. Have you or someone else been injured as a result of your drinking? 0 0 0 0  10. Has a relative or friend or a doctor or another health worker been concerned about your drinking or suggested you cut down? 2 0 0 0  Alcohol Use Disorder Identification Test Final Score (AUDIT) 7 6  6  5       Patient-reported   Patient recently used drugs:    Opioid Risk  Assessment:  Goals, Interventions and Follow-up Plan Goals: {IBH Goals:21014053} Interventions: {IBH Interventions:21014054} Follow-up Plan: {Virtual BH Follow up Recommendations:21014064}  Summary: ***  Daniel Scott

## 2024-08-19 ENCOUNTER — Ambulatory Visit: Admitting: Professional Counselor

## 2024-08-19 DIAGNOSIS — F339 Major depressive disorder, recurrent, unspecified: Secondary | ICD-10-CM

## 2024-08-21 ENCOUNTER — Encounter: Payer: Self-pay | Admitting: Family Medicine

## 2024-08-22 ENCOUNTER — Telehealth: Payer: Self-pay

## 2024-08-22 ENCOUNTER — Ambulatory Visit: Admitting: Family Medicine

## 2024-08-22 VITALS — BP 115/70 | HR 79 | Temp 98.7°F | Ht 69.5 in | Wt 301.6 lb

## 2024-08-22 DIAGNOSIS — K8020 Calculus of gallbladder without cholecystitis without obstruction: Secondary | ICD-10-CM

## 2024-08-22 DIAGNOSIS — Z7985 Long-term (current) use of injectable non-insulin antidiabetic drugs: Secondary | ICD-10-CM

## 2024-08-22 DIAGNOSIS — I4729 Other ventricular tachycardia: Secondary | ICD-10-CM

## 2024-08-22 DIAGNOSIS — R109 Unspecified abdominal pain: Secondary | ICD-10-CM | POA: Diagnosis not present

## 2024-08-22 DIAGNOSIS — E1165 Type 2 diabetes mellitus with hyperglycemia: Secondary | ICD-10-CM | POA: Diagnosis not present

## 2024-08-22 DIAGNOSIS — R110 Nausea: Secondary | ICD-10-CM

## 2024-08-22 NOTE — Telephone Encounter (Signed)
 Scheduled appt.

## 2024-08-22 NOTE — Telephone Encounter (Signed)
 Copied from CRM [id number]. Topic: Appointments - Appointment Scheduling >> Aug 22, 2024  8:12 AM Daniel Scott wrote: Patient/patient representative is calling to schedule an appointment.   Patient is wanting an appointment with [name]. Could you assist? Patients callback number is [phone].

## 2024-08-22 NOTE — Progress Notes (Signed)
 Acute Office Visit  Subjective:     Patient ID: GILL DELROSSI, male    DOB: Aug 13, 1961, 63 y.o.   MRN: [id number]  Chief Complaint  Patient presents with   Abdominal Pain    Abdominal Pain    History of Present Illness   DAGON BUDAI is a 63 year old male who presents with severe abdominal pain, nausea, and dizziness.  He had a episode of severe abdominal pain 4 night ago  with nausea, and dizziness that began on Sunday night while lying down. The pain is described as intense cramps and gas pain, accompanied by nausea and diarrhea. During a bowel movement that night, he felt dizzy and lightheaded, but these symptoms have not recurred since. The pain was so severe that he felt 'literally paralyzed' and unable to move, which eventually eased after dry heaving. He did have some blood in his stool during the bowel movement. These symptoms have resolved. He has not have any more blood in his stool.  A history of internal hemorrhoids was noted during a colonoscopy last year. No persistent nausea, dizziness, or lightheadedness since the initial episode. He does reports consistent mild abdominal cramping and occasional waves of nausea since increasing Mounjaro  to 10 mg weekly.  He has a history of ventricular tachycardia, for which he was placed on metoprolol  after an episode two years ago. He increased his metoprolol  dosage from 12.5 mg to 25 mg following a minor arrhythmia episode, and he has not had any issues since. No recent episodes of dizziness on exertion. No chest pain.       Review of Systems  Gastrointestinal:  Positive for abdominal pain.   As per HPI.      Objective:    BP 115/70   Pulse 79   Temp 98.7 F (37.1 C) (Temporal)   Ht 5' 9.5 (1.765 m)   Wt (!) 301 lb 9.6 oz (136.8 kg)   SpO2 97%   BMI 43.90 kg/m  Wt Readings from Last 3 Encounters:  08/22/24 (!) 301 lb 9.6 oz (136.8 kg)  07/12/24 (!) 311 lb (141.1 kg)  06/12/24 (!) 309 lb 3.2 oz (140.3 kg)       Physical Exam Vitals and nursing note reviewed.  Constitutional:      General: He is not in acute distress.    Appearance: He is obese. He is not ill-appearing, toxic-appearing or diaphoretic.  Eyes:     General: No scleral icterus. Cardiovascular:     Rate and Rhythm: Normal rate and regular rhythm.     Heart sounds: Normal heart sounds. No murmur heard. Pulmonary:     Effort: Pulmonary effort is normal. No respiratory distress.     Breath sounds: Normal breath sounds.  Abdominal:     General: Bowel sounds are normal.     Palpations: Abdomen is soft. There is no shifting dullness, hepatomegaly, splenomegaly, mass or pulsatile mass.     Tenderness: There is no abdominal tenderness. There is no guarding or rebound.  Skin:    General: Skin is warm and dry.     Coloration: Skin is not jaundiced.  Neurological:     General: No focal deficit present.     Mental Status: He is alert and oriented to person, place, and time.  Psychiatric:        Mood and Affect: Mood normal.        Behavior: Behavior normal.     No results found for any visits on 08/22/24.  Assessment & Plan:   Muhammadali was seen today for abdominal pain.  Diagnoses and all orders for this visit:  Abdominal cramping  Type 2 diabetes mellitus with hyperglycemia, without long-term current use of insulin  (HCC)  Nausea  Gallstones  NSVT (nonsustained ventricular tachycardia) (HCC)   Assessment and Plan    Gastrointestinal adverse effects of antidiabetic therapy Intermittent abdominal cramping and nausea likely due to Mounjaro  (tirzepatide ) 10 mg.  - Continue Mounjaro  10 mg. - If symptoms worsen, consider reducing Mounjaro  to 7.5 mg. - A1c has been well controlled.   Nausea Gallstones If nausea increases or persists, recommend follow up with general surgeon.   Nonsustained ventricular tachycardia No recent episodes since increasing metoprolol  to 25 mg. No current arrhythmia symptoms. - Continue  metoprolol  25 mg daily.      Return to office for new or worsening symptoms, or if symptoms persist.    Annabella CHRISTELLA Search, FNP

## 2024-08-26 NOTE — BH Specialist Note (Signed)
 [facility] Virtual [facility] Telephone Follow-up  MRN: [id number] NAME: Daniel Scott Date: 08/26/2024  Start time: Start Time: 1000 End time: Stop Time: 1015 Total time: Total Time in Minutes (Visit): 15 Call number: Visit Number: 5-Fifth Visit  Reason for call today:  The patient is a 63 year old male who presented for a collaborative care follow-up visit after recently initiating a new medication as recommended by the psychiatric consultant. He reports feeling somewhat overwhelmed by the holidays but notes that he has been taking Lexapro  consistently for approximately two weeks. At this time, he describes a general sense of mild fatigue but has not yet noticed a significant change in mood or anxiety symptoms. He denies any adverse side effects and understands that antidepressant medications typically require four to six weeks to reach full therapeutic effect.   Summary and Plan:   Supportive counseling and brief interventions were provided during the session to address holiday-related stress and reinforce coping strategies. Given the early stage of medication treatment and the absence of concerning side effects, the plan is to continue the current Lexapro  regimen and monitor response over the coming weeks. The patient will follow up in approximately two weeks to reassess symptoms, evaluate medication effectiveness, and provide additional support as needed.   PHQ-9 Scores:     08/22/2024    1:20 PM 08/13/2024   10:39 AM 07/16/2024   10:45 AM 06/12/2024    9:35 AM 03/07/2024    8:00 AM  Depression screen PHQ 2/9  Decreased Interest 3 2 3 2 1   Down, Depressed, Hopeless 2 1 1 1  0  PHQ - 2 Score 5 3 4 3 1   Altered sleeping 2 1 0 1 1  Tired, decreased energy 3 2 2 2 1   Change in appetite 0 0 0 0 0  Feeling bad or failure about yourself  2 1 2 1 1   Trouble concentrating 0 1 1 0 0  Moving slowly or fidgety/restless 0 0 0 0 0  Suicidal thoughts 0 0 1 0 0  PHQ-9 Score 12 8 10 7  4    Difficult  doing work/chores Somewhat difficult Somewhat difficult Somewhat difficult Somewhat difficult Not difficult at all     Data saved with a previous flowsheet row definition   GAD-7 Scores:     08/22/2024    1:20 PM 08/13/2024   10:40 AM 07/16/2024   10:49 AM 06/12/2024    9:36 AM  GAD 7 : Generalized Anxiety Score  Nervous, Anxious, on Edge 1 1 2  0  Control/stop worrying 2 1 1 1   Worry too much - different things 2 2 1 2   Trouble relaxing 1 1 0 1  Restless 0 0 0 0  Easily annoyed or irritable 3 2 2 2   Afraid - awful might happen 2 1 2 2   Total GAD 7 Score 11 8 8 8   Anxiety Difficulty Somewhat difficult Somewhat difficult Somewhat difficult Somewhat difficult    Stress Current stressors:  holidays Sleep:  good Appetite:  good Coping ability:  good Patient taking medications as prescribed:  Yes  Current medications:  Outpatient Encounter Medications as of 08/19/2024  Medication Sig   amLODipine  (NORVASC ) 10 MG tablet TAKE 1 TABLET BY MOUTH EVERY DAY   APPLE CIDER VINEGAR PO Take 450 mg by mouth 2 (two) times daily.   aspirin  325 MG EC tablet Take 325 mg by mouth daily.   atorvastatin  (LIPITOR ) 80 MG tablet TAKE 1 TABLET BY MOUTH EVERY DAY   cholecalciferol (  VITAMIN D3) 25 MCG (1000 UNIT) tablet Take 1,000 Units by mouth daily.   CINNAMON PO Take 1,000 mg by mouth 2 (two) times daily.   clotrimazole -betamethasone  (LOTRISONE ) cream Apply 1 Application topically daily.   empagliflozin  (JARDIANCE ) 25 MG TABS tablet TAKE 1 TABLET BY MOUTH EVERY DAY BEFORE BREAKFAST   escitalopram  (LEXAPRO ) 10 MG tablet Take 1 tablet (10 mg total) by mouth daily.   Garlic 1000 MG CAPS Take 1,000 mg by mouth 2 (two) times daily.   Krill Oil 500 MG CAPS Take 500 mg by mouth daily.   levothyroxine  (SYNTHROID ) 75 MCG tablet TAKE 1 TABLET BY MOUTH EVERY DAY BEFORE BREAKFAST   lisinopril  (ZESTRIL ) 20 MG tablet TAKE 1 TABLET BY MOUTH EVERY DAY   metFORMIN  (GLUCOPHAGE -XR) 500 MG 24 hr tablet Take 2 tablets  (1,000 mg total) by mouth 2 (two) times daily with a meal.   metoprolol  succinate (TOPROL  XL) 25 MG 24 hr tablet Take 1 tablet (25 mg total) by mouth daily.   Multiple Vitamin (MULTIVITAMIN ADULT PO) Take 1 tablet by mouth daily.    nitroGLYCERIN  (NITROSTAT ) 0.4 MG SL tablet PLACE 1 TABLET UNDER THE TONGUE EVERY 5 MINUTES AS NEEDED FOR CHEST PAIN.   olopatadine (PATANOL) 0.1 % ophthalmic solution Place 1 drop into both eyes daily as needed for allergies.   omeprazole  (PRILOSEC) 20 MG capsule Take 20 mg by mouth daily.   psyllium (HYDROCIL/METAMUCIL) 95 % PACK Take 1 packet by mouth daily.   spironolactone  (ALDACTONE ) 50 MG tablet Take 1 tablet (50 mg total) by mouth daily.   tirzepatide  (MOUNJARO ) 10 MG/0.5ML Pen Inject 10 mg into the skin once a week.   Turmeric (QC TUMERIC COMPLEX PO) Take 1 capsule by mouth in the morning and at bedtime.   [DISCONTINUED] methocarbamol  (ROBAXIN ) 500 MG tablet TAKE 1 TABLET BY MOUTH EVERY 6 HOURS AS NEEDED FOR MUSCLE SPASMS.   No facility-administered encounter medications on file as of 08/19/2024.     Self-harm Behaviors Risk Assessment Self-harm risk factors:  past thoughts, depression Patient endorses recent thoughts of harming self:  Denies   Danger to Others Risk Assessment Danger to others risk factors:  None reported Patient endorses recent thoughts of harming others:     Substance Use Assessment Patient recently consumed alcohol:  no  Alcohol Use Disorder Identification Test (AUDIT):     12/26/2022    8:48 AM 06/15/2023    1:45 PM 10/19/2023    8:58 AM 02/29/2024    8:09 AM 08/22/2024    8:22 AM  Alcohol Use Disorder Test (AUDIT)  1. How often do you have a drink containing alcohol? 3  3  3  3  3    2. How many drinks containing alcohol do you have on a typical day when you are drinking? 1  1  1  1  1    3. How often do you have six or more drinks on one occasion? 1  1  2  1  1    AUDIT-C Score 5 5  6  5  5    4. How often during the last  year have you found that you were not able to stop drinking once you had started? 0  0  0  0  0   5. How often during the last year have you failed to do what was normally expected from you because of drinking? 0  1  0  0  0   6. How often during the last year  have you needed a first drink in the morning to get yourself going after a heavy drinking session? 0  0  0  0  0   7. How often during the last year have you had a feeling of guilt of remorse after drinking? 0  0  0  0  0   8. How often during the last year have you been unable to remember what happened the night before because you had been drinking? 0  0  0  0  0   9. Have you or someone else been injured as a result of your drinking? 0  0  0  0  0   10. Has a relative or friend or a doctor or another health worker been concerned about your drinking or suggested you cut down? 2  0  0  0  0   Alcohol Use Disorder Identification Test Final Score (AUDIT) 7 6  6  5  5       Manually entered by patient   Patient recently used drugs:    Opioid Risk Assessment:    Goals, Interventions and Follow-up Plan Goals: Increase healthy adjustment to current life circumstances Interventions: CBT Cognitive Behavioral Therapy and Medication Monitoring Follow-up Plan: 2 weeks    [name]

## 2024-09-03 ENCOUNTER — Ambulatory Visit (INDEPENDENT_AMBULATORY_CARE_PROVIDER_SITE_OTHER): Payer: Self-pay | Admitting: Professional Counselor

## 2024-09-03 DIAGNOSIS — F419 Anxiety disorder, unspecified: Secondary | ICD-10-CM | POA: Diagnosis not present

## 2024-09-03 DIAGNOSIS — F331 Major depressive disorder, recurrent, moderate: Secondary | ICD-10-CM

## 2024-09-03 NOTE — BH Specialist Note (Unsigned)
  Follow-up  MRN: [id number] NAME: Daniel Scott Date: 09/03/2024  Start time: Start Time: 1030 End time: Stop Time: 1100 Total time: Total Time in Minutes (Visit): 30   Reason for call today:  The patient is a 63 year old male who presented for a collaborative care follow-up session. He reports ongoing depressive symptoms, including persistently low mood, low energy, and decreased interest in previously enjoyable activities. Approximately one month ago, the patient initiated Lexapro  at a low dose and reports minimal perceived benefit at this time.  Given the limited response, the plan is to consult with the collaborating psychiatrist to determine whether a dosage increase is clinically appropriate prior to making further treatment changes, in order to adequately assess medication efficacy. The patient expressed strong interest in engaging in traditional, long-term therapy and discussed the importance of establishing consistent outpatient counseling.  Information for a traditional therapist will be added to the patients chart, and a referral will be initiated to support a smooth transition. The patient will be seen for one additional collaborative care follow-up session while medication adjustments are monitored and prior to transitioning out of collaborative care.  PHQ-9 Scores:     08/22/2024    1:20 PM 08/13/2024   10:39 AM 07/16/2024   10:45 AM 06/12/2024    9:35 AM 03/07/2024    8:00 AM  Depression screen PHQ 2/9  Decreased Interest 3 2 3 2 1   Down, Depressed, Hopeless 2 1 1 1  0  PHQ - 2 Score 5 3 4 3 1   Altered sleeping 2 1 0 1 1  Tired, decreased energy 3 2 2 2 1   Change in appetite 0 0 0 0 0  Feeling bad or failure about yourself  2 1 2 1 1   Trouble concentrating 0 1 1 0 0  Moving slowly or fidgety/restless 0 0 0 0 0  Suicidal thoughts 0 0 1 0 0  PHQ-9 Score 12 8 10 7  4    Difficult doing work/chores Somewhat difficult Somewhat difficult Somewhat difficult Somewhat  difficult Not difficult at all     Data saved with a previous flowsheet row definition   GAD-7 Scores:     08/22/2024    1:20 PM 08/13/2024   10:40 AM 07/16/2024   10:49 AM 06/12/2024    9:36 AM  GAD 7 : Generalized Anxiety Score  Nervous, Anxious, on Edge 1 1 2  0  Control/stop worrying 2 1 1 1   Worry too much - different things 2 2 1 2   Trouble relaxing 1 1 0 1  Restless 0 0 0 0  Easily annoyed or irritable 3 2 2 2   Afraid - awful might happen 2 1 2 2   Total GAD 7 Score 11 8 8 8   Anxiety Difficulty Somewhat difficult Somewhat difficult Somewhat difficult Somewhat difficult    Stress Current stressors:  low energy Sleep:  fair Appetite:  good Coping ability:  Fair Patient taking medications as prescribed:  Yes  Current medications:  Outpatient Encounter Medications as of 09/03/2024  Medication Sig   amLODipine  (NORVASC ) 10 MG tablet TAKE 1 TABLET BY MOUTH EVERY DAY   APPLE CIDER VINEGAR PO Take 450 mg by mouth 2 (two) times daily.   aspirin  325 MG EC tablet Take 325 mg by mouth daily.   atorvastatin  (LIPITOR ) 80 MG tablet TAKE 1 TABLET BY MOUTH EVERY DAY   cholecalciferol (VITAMIN D3) 25 MCG (1000 UNIT) tablet Take 1,000 Units by mouth daily.   CINNAMON PO Take 1,000 mg  by mouth 2 (two) times daily.   clotrimazole -betamethasone  (LOTRISONE ) cream Apply 1 Application topically daily.   empagliflozin  (JARDIANCE ) 25 MG TABS tablet TAKE 1 TABLET BY MOUTH EVERY DAY BEFORE BREAKFAST   escitalopram  (LEXAPRO ) 10 MG tablet Take 1 tablet (10 mg total) by mouth daily.   Garlic 1000 MG CAPS Take 1,000 mg by mouth 2 (two) times daily.   Krill Oil 500 MG CAPS Take 500 mg by mouth daily.   levothyroxine  (SYNTHROID ) 75 MCG tablet TAKE 1 TABLET BY MOUTH EVERY DAY BEFORE BREAKFAST   lisinopril  (ZESTRIL ) 20 MG tablet TAKE 1 TABLET BY MOUTH EVERY DAY   metFORMIN  (GLUCOPHAGE -XR) 500 MG 24 hr tablet Take 2 tablets (1,000 mg total) by mouth 2 (two) times daily with a meal.   metoprolol  succinate  (TOPROL  XL) 25 MG 24 hr tablet Take 1 tablet (25 mg total) by mouth daily.   Multiple Vitamin (MULTIVITAMIN ADULT PO) Take 1 tablet by mouth daily.    nitroGLYCERIN  (NITROSTAT ) 0.4 MG SL tablet PLACE 1 TABLET UNDER THE TONGUE EVERY 5 MINUTES AS NEEDED FOR CHEST PAIN.   olopatadine (PATANOL) 0.1 % ophthalmic solution Place 1 drop into both eyes daily as needed for allergies.   omeprazole  (PRILOSEC) 20 MG capsule Take 20 mg by mouth daily.   psyllium (HYDROCIL/METAMUCIL) 95 % PACK Take 1 packet by mouth daily.   spironolactone  (ALDACTONE ) 50 MG tablet Take 1 tablet (50 mg total) by mouth daily.   tirzepatide  (MOUNJARO ) 10 MG/0.5ML Pen Inject 10 mg into the skin once a week.   Turmeric (QC TUMERIC COMPLEX PO) Take 1 capsule by mouth in the morning and at bedtime.   No facility-administered encounter medications on file as of 09/03/2024.     Self-harm Behaviors Risk Assessment Self-harm risk factors:  depression, past thoughts  Patient endorses recent thoughts of harming self:  Denies   Danger to Others Risk Assessment Danger to others risk factors:  None Patient endorses recent thoughts of harming others:  Denies   Substance Use Assessment Patient recently consumed alcohol:    Alcohol Use Disorder Identification Test (AUDIT):     12/26/2022    8:48 AM 06/15/2023    1:45 PM 10/19/2023    8:58 AM 02/29/2024    8:09 AM 08/22/2024    8:22 AM  Alcohol Use Disorder Test (AUDIT)  1. How often do you have a drink containing alcohol? 3  3  3  3  3    2. How many drinks containing alcohol do you have on a typical day when you are drinking? 1  1  1  1  1    3. How often do you have six or more drinks on one occasion? 1  1  2  1  1    AUDIT-C Score 5 5  6  5  5    4. How often during the last year have you found that you were not able to stop drinking once you had started? 0  0  0  0  0   5. How often during the last year have you failed to do what was normally expected from you because of drinking?  0  1  0  0  0   6. How often during the last year have you needed a first drink in the morning to get yourself going after a heavy drinking session? 0  0  0  0  0   7. How often during the last year have you had a feeling  of guilt of remorse after drinking? 0  0  0  0  0   8. How often during the last year have you been unable to remember what happened the night before because you had been drinking? 0  0  0  0  0   9. Have you or someone else been injured as a result of your drinking? 0  0  0  0  0   10. Has a relative or friend or a doctor or another health worker been concerned about your drinking or suggested you cut down? 2  0  0  0  0   Alcohol Use Disorder Identification Test Final Score (AUDIT) 7 6  6  5  5       Manually entered by patient     Goals, Interventions and Follow-up Plan Goals: Increase healthy adjustment to current life circumstances Interventions: CBT Cognitive Behavioral Therapy and Medication Monitoring Follow-up Plan: Consult with psychiatric consultant regarding potential Lexapro  dosage increase. Monitor depressive symptoms and medication response. Provide referral and care coordination for traditional therapy. Schedule one additional collaborative care follow-up prior to transition.   [name]

## 2024-09-04 NOTE — Patient Instructions (Signed)
 Talmadge Coventry, Andres Shad,?[facility], [facility]   Address: [address] [place], [place] [address]   Phone: [phone]   Services: Individual/couples therapy, mental wellness and behavioral coaching    Website: [employer] in [place], [place] -  [email or web address]

## 2024-09-06 ENCOUNTER — Encounter: Payer: Self-pay | Admitting: Family Medicine

## 2024-09-06 MED ORDER — ESCITALOPRAM OXALATE 20 MG PO TABS: 20.0000 mg | ORAL_TABLET | Freq: Every day | ORAL | 5 refills | Status: DC

## 2024-09-06 NOTE — Progress Notes (Signed)
Lexapro increased to 20 mg daily

## 2024-09-06 NOTE — Addendum Note (Signed)
 Addended by: JOESPH ANNABELLA HERO on: 09/06/2024 09:23 AM   Modules accepted: Orders

## 2024-09-15 ENCOUNTER — Encounter: Payer: Self-pay | Admitting: Family Medicine

## 2024-09-16 ENCOUNTER — Other Ambulatory Visit: Payer: Self-pay | Admitting: Family Medicine

## 2024-09-16 DIAGNOSIS — E1159 Type 2 diabetes mellitus with other circulatory complications: Secondary | ICD-10-CM

## 2024-09-16 MED ORDER — SPIRONOLACTONE 25 MG PO TABS: 25.0000 mg | ORAL_TABLET | Freq: Every day | ORAL | 3 refills | Status: AC

## 2024-09-17 ENCOUNTER — Ambulatory Visit (INDEPENDENT_AMBULATORY_CARE_PROVIDER_SITE_OTHER): Payer: Self-pay | Admitting: Professional Counselor

## 2024-09-17 DIAGNOSIS — F331 Major depressive disorder, recurrent, moderate: Secondary | ICD-10-CM

## 2024-09-24 NOTE — BH Specialist Note (Addendum)
 [facility] Telephone Follow-up  MRN: [id number] NAME: Daniel Scott Date: 09/17/2024  Start time: Start Time: 0900 End time: Stop Time: 0930 Total time: Total Time in Minutes (Visit): 30 Call number: Visit Number: Additional Visit  Reason for call today:  The patient is a 64 year old male who presented for a collaborative care follow-up visit via telehealth. The purpose of todays session was a final check-in to ensure successful connection with traditional outpatient therapy prior to discharge from the collaborative care program.  The patient continues to report ongoing depressive symptoms and anxiety. He expressed that he believes his primary need at this time is therapeutic support rather than medication management. A referral to traditional therapy had previously been placed, and outreach attempts were made twice without an appointment initially being scheduled. During todays session, the patient was reassured that additional follow-up would occur to facilitate connection.  Following the session, contact was made with the referred therapist, who confirmed that she has since reached out to the patient. It is anticipated that the patient and therapist are now connected or will be shortly. One final follow-up will be completed to confirm that the appointment has been successfully scheduled prior to formally signing off from collaborative care.  The patient was encouraged to continue following up with his primary care provider for any medication-related needs. At this time, it appears that the appropriate referral has been made and that transition to traditional therapy is appropriate.    Plan: Confirm that the patient has successfully scheduled and attended an initial therapy appointment Sign off from the collaborative care program once therapy connection is confirmed Patient to follow up with primary care provider for ongoing medication management as needed  PHQ-9 Scores:      09/03/2024   10:50 AM 08/22/2024    1:20 PM 08/13/2024   10:39 AM 07/16/2024   10:45 AM 06/12/2024    9:35 AM  Depression screen PHQ 2/9  Decreased Interest 3 3 2 3 2   Down, Depressed, Hopeless 2 2 1 1 1   PHQ - 2 Score 5 5 3 4 3   Altered sleeping 1 2 1  0 1  Tired, decreased energy 3 3 2 2 2   Change in appetite 0 0 0 0 0  Feeling bad or failure about yourself  1 2 1 2 1   Trouble concentrating 1 0 1 1 0  Moving slowly or fidgety/restless 0 0 0 0 0  Suicidal thoughts 0 0 0 1 0  PHQ-9 Score 11 12 8 10 7    Difficult doing work/chores Very difficult Somewhat difficult Somewhat difficult Somewhat difficult Somewhat difficult     Data saved with a previous flowsheet row definition   GAD-7 Scores:     09/03/2024   10:52 AM 08/22/2024    1:20 PM 08/13/2024   10:40 AM 07/16/2024   10:49 AM  GAD 7 : Generalized Anxiety Score  Nervous, Anxious, on Edge 1  1  1  2    Control/stop worrying 1  2  1  1    Worry too much - different things 1  2  2  1    Trouble relaxing 1  1  1   0   Restless 0  0  0  0   Easily annoyed or irritable 2  3  2  2    Afraid - awful might happen 3  2  1  2    Total GAD 7 Score 9 11 8 8   Anxiety Difficulty Somewhat difficult Somewhat difficult Somewhat  difficult Somewhat difficult     Data saved with a previous flowsheet row definition    Stress Current stressors:  none reported Sleep:  oversleeping Appetite:  good Coping ability:  fair Patient taking medications as prescribed:  yes  Current medications:  Outpatient Encounter Medications as of 09/17/2024  Medication Sig   amLODipine  (NORVASC ) 10 MG tablet TAKE 1 TABLET BY MOUTH EVERY DAY   APPLE CIDER VINEGAR PO Take 450 mg by mouth 2 (two) times daily.   aspirin  325 MG EC tablet Take 325 mg by mouth daily.   atorvastatin  (LIPITOR ) 80 MG tablet TAKE 1 TABLET BY MOUTH EVERY DAY   cholecalciferol (VITAMIN D3) 25 MCG (1000 UNIT) tablet Take 1,000 Units by mouth daily.   CINNAMON PO Take 1,000 mg by mouth 2 (two)  times daily.   clotrimazole -betamethasone  (LOTRISONE ) cream Apply 1 Application topically daily.   empagliflozin  (JARDIANCE ) 25 MG TABS tablet TAKE 1 TABLET BY MOUTH EVERY DAY BEFORE BREAKFAST   escitalopram  (LEXAPRO ) 20 MG tablet Take 1 tablet (20 mg total) by mouth daily.   Garlic 1000 MG CAPS Take 1,000 mg by mouth 2 (two) times daily.   Krill Oil 500 MG CAPS Take 500 mg by mouth daily.   levothyroxine  (SYNTHROID ) 75 MCG tablet TAKE 1 TABLET BY MOUTH EVERY DAY BEFORE BREAKFAST   lisinopril  (ZESTRIL ) 20 MG tablet TAKE 1 TABLET BY MOUTH EVERY DAY   metFORMIN  (GLUCOPHAGE -XR) 500 MG 24 hr tablet Take 2 tablets (1,000 mg total) by mouth 2 (two) times daily with a meal.   metoprolol  succinate (TOPROL  XL) 25 MG 24 hr tablet Take 1 tablet (25 mg total) by mouth daily.   Multiple Vitamin (MULTIVITAMIN ADULT PO) Take 1 tablet by mouth daily.    nitroGLYCERIN  (NITROSTAT ) 0.4 MG SL tablet PLACE 1 TABLET UNDER THE TONGUE EVERY 5 MINUTES AS NEEDED FOR CHEST PAIN.   olopatadine (PATANOL) 0.1 % ophthalmic solution Place 1 drop into both eyes daily as needed for allergies.   omeprazole  (PRILOSEC) 20 MG capsule Take 20 mg by mouth daily.   psyllium (HYDROCIL/METAMUCIL) 95 % PACK Take 1 packet by mouth daily.   spironolactone  (ALDACTONE ) 25 MG tablet Take 1 tablet (25 mg total) by mouth daily.   tirzepatide  (MOUNJARO ) 10 MG/0.5ML Pen Inject 10 mg into the skin once a week.   Turmeric (QC TUMERIC COMPLEX PO) Take 1 capsule by mouth in the morning and at bedtime.   No facility-administered encounter medications on file as of 09/17/2024.     Self-harm Behaviors Risk Assessment Self-harm risk factors:  Past thoughts Patient endorses recent thoughts of harming self:  Denies   Danger to Others Risk Assessment Danger to others risk factors:  None Patient endorses recent thoughts of harming others:  Denies  Dynamic Appraisal of Situational Aggression (DASA):      No data to display           Substance  Use Assessment Patient recently consumed alcohol:    Alcohol Use Disorder Identification Test (AUDIT):     12/26/2022    8:48 AM 06/15/2023    1:45 PM 10/19/2023    8:58 AM 02/29/2024    8:09 AM 08/22/2024    8:22 AM  Alcohol Use Disorder Test (AUDIT)  1. How often do you have a drink containing alcohol? 3  3  3  3  3    2. How many drinks containing alcohol do you have on a typical day when you are drinking? 1  1  1  1  1   3. How often do you have six or more drinks on one occasion? 1  1  2  1  1    AUDIT-C Score 5 5  6  5  5    4. How often during the last year have you found that you were not able to stop drinking once you had started? 0  0  0  0  0   5. How often during the last year have you failed to do what was normally expected from you because of drinking? 0  1  0  0  0   6. How often during the last year have you needed a first drink in the morning to get yourself going after a heavy drinking session? 0  0  0  0  0   7. How often during the last year have you had a feeling of guilt of remorse after drinking? 0  0  0  0  0   8. How often during the last year have you been unable to remember what happened the night before because you had been drinking? 0  0  0  0  0   9. Have you or someone else been injured as a result of your drinking? 0  0  0  0  0   10. Has a relative or friend or a doctor or another health worker been concerned about your drinking or suggested you cut down? 2  0  0  0  0   Alcohol Use Disorder Identification Test Final Score (AUDIT) 7 6  6  5  5       Manually entered by patient   Goals, Interventions and Follow-up Plan Goals: Increase healthy adjustment to current life circumstances Interventions: CBT Cognitive Behavioral Therapy Follow-up Plan: Confirm that the patient has successfully scheduled and attended an initial therapy appointment Sign off from the collaborative care program once therapy connection is confirmed Patient to follow up with primary care  provider for ongoing medication management as needed   [name]

## 2024-09-30 ENCOUNTER — Other Ambulatory Visit: Payer: Self-pay | Admitting: Family Medicine

## 2024-09-30 DIAGNOSIS — F331 Major depressive disorder, recurrent, moderate: Secondary | ICD-10-CM

## 2024-09-30 DIAGNOSIS — F419 Anxiety disorder, unspecified: Secondary | ICD-10-CM

## 2024-10-11 ENCOUNTER — Ambulatory Visit: Payer: Self-pay | Admitting: Family Medicine

## 2024-10-11 ENCOUNTER — Encounter: Payer: Self-pay | Admitting: Family Medicine

## 2024-10-11 VITALS — BP 114/74 | HR 67 | Temp 98.2°F | Ht 69.5 in | Wt 293.2 lb

## 2024-10-11 DIAGNOSIS — I700 Atherosclerosis of aorta: Secondary | ICD-10-CM

## 2024-10-11 DIAGNOSIS — E039 Hypothyroidism, unspecified: Secondary | ICD-10-CM

## 2024-10-11 DIAGNOSIS — I4729 Other ventricular tachycardia: Secondary | ICD-10-CM

## 2024-10-11 DIAGNOSIS — E1165 Type 2 diabetes mellitus with hyperglycemia: Secondary | ICD-10-CM

## 2024-10-11 DIAGNOSIS — E1159 Type 2 diabetes mellitus with other circulatory complications: Secondary | ICD-10-CM

## 2024-10-11 DIAGNOSIS — F419 Anxiety disorder, unspecified: Secondary | ICD-10-CM

## 2024-10-11 DIAGNOSIS — E782 Mixed hyperlipidemia: Secondary | ICD-10-CM

## 2024-10-11 DIAGNOSIS — I10 Essential (primary) hypertension: Secondary | ICD-10-CM

## 2024-10-11 DIAGNOSIS — F331 Major depressive disorder, recurrent, moderate: Secondary | ICD-10-CM

## 2024-10-11 DIAGNOSIS — E1169 Type 2 diabetes mellitus with other specified complication: Secondary | ICD-10-CM

## 2024-10-11 LAB — BAYER DCA HB A1C WAIVED: HB A1C (BAYER DCA - WAIVED): 5.9 % — ABNORMAL HIGH (ref 4.8–5.6)

## 2024-10-11 MED ORDER — AMLODIPINE BESYLATE 10 MG PO TABS: 10.0000 mg | ORAL_TABLET | Freq: Every day | ORAL | 3 refills | Status: AC

## 2024-10-11 MED ORDER — METOPROLOL SUCCINATE ER 25 MG PO TB24: 25.0000 mg | ORAL_TABLET | Freq: Every day | ORAL | 3 refills | Status: AC

## 2024-10-11 MED ORDER — LISINOPRIL 20 MG PO TABS: 20.0000 mg | ORAL_TABLET | Freq: Every day | ORAL | 3 refills | Status: AC

## 2024-10-11 MED ORDER — EMPAGLIFLOZIN 25 MG PO TABS: ORAL_TABLET | ORAL | 3 refills | Status: DC

## 2024-10-11 MED ORDER — METFORMIN HCL ER 500 MG PO TB24: 1000.0000 mg | ORAL_TABLET | Freq: Two times a day (BID) | ORAL | 3 refills | Status: AC

## 2024-10-11 MED ORDER — ESCITALOPRAM OXALATE 20 MG PO TABS: 20.0000 mg | ORAL_TABLET | Freq: Every day | ORAL | 3 refills | Status: AC

## 2024-10-11 MED ORDER — TIRZEPATIDE 10 MG/0.5ML ~~LOC~~ SOAJ: 10.0000 mg | SUBCUTANEOUS | 3 refills | Status: AC

## 2024-10-11 MED ORDER — ATORVASTATIN CALCIUM 80 MG PO TABS: 80.0000 mg | ORAL_TABLET | Freq: Every day | ORAL | 3 refills | Status: AC

## 2024-10-11 NOTE — Progress Notes (Signed)
 "  Established Patient Office Visit  Subjective   Patient ID: Daniel Scott, male    DOB: [birth year]  Age: 64 y.o. MRN: [id number]  Chief Complaint  Patient presents with   Medical Management of Chronic Issues    HPI  History of Present Illness   Daniel Scott is a 64 year old male with type 2 diabetes who presents for a follow-up visit.  Glycemic control and diabetes management - Type 2 diabetes with recent improvement in A1c to 5.9 from the high sixes - Currently taking Mounjaro  10 mg - Questions necessity of continuing Jardiance , as he wishes to discontinue this  Weight loss and appetite changes - Weight continues to trend down, now below 300 pounds - Close to losing 100 pounds since March 2013 - Goal weight is 275 pounds, his weight upon leaving the [employer] - Decreased appetite, primarily eating dinner daily with small breakfasts or lunches - Ensures adequate protein intake and does most cooking at home  Cardiac symptoms - Minor palpitations, without symptoms - No chest pain, edema, shortness of breath - No numbness or tingling in feet  Antihypertensive medication adjustment - Reduced spironolactone  dose from 50 mg to 25 mg  - Continues on lisinopril   Mood and mental health - On Lexapro , increased to 20 mg about three to four weeks ago - Slight improvement in mood since dose increase - Acknowledges need for therapy and has been recommended therapists but has not yet made contact - History of a difficult period in 2007 with multiple medications for depression - Off antidepressants from 2018-2019 until recent Lexapro  restart          09/03/2024   10:50 AM 08/22/2024    1:20 PM 08/13/2024   10:39 AM  Depression screen PHQ 2/9  Decreased Interest 3 3 2   Down, Depressed, Hopeless 2 2 1   PHQ - 2 Score 5 5 3   Altered sleeping 1 2 1   Tired, decreased energy 3 3 2   Change in appetite 0 0 0  Feeling bad or failure about yourself  1 2 1   Trouble concentrating 1 0 1   Moving slowly or fidgety/restless 0 0 0  Suicidal thoughts 0 0 0  PHQ-9 Score 11 12 8   Difficult doing work/chores Very difficult Somewhat difficult Somewhat difficult      09/03/2024   10:52 AM 08/22/2024    1:20 PM 08/13/2024   10:40 AM 07/16/2024   10:49 AM  GAD 7 : Generalized Anxiety Score  Nervous, Anxious, on Edge 1  1  1  2    Control/stop worrying 1  2  1  1    Worry too much - different things 1  2  2  1    Trouble relaxing 1  1  1   0   Restless 0  0  0  0   Easily annoyed or irritable 2  3  2  2    Afraid - awful might happen 3  2  1  2    Total GAD 7 Score 9 11 8 8   Anxiety Difficulty Somewhat difficult Somewhat difficult Somewhat difficult Somewhat difficult     Data saved with a previous flowsheet row definition       ROS As per HPI.    Objective:     BP 114/74   Pulse 67   Temp 98.2 F (36.8 C) (Temporal)   Ht 5' 9.5 (1.765 m)   Wt 293 lb 3.2 oz (133 kg)   SpO2 99%  BMI 42.68 kg/m  Wt Readings from Last 3 Encounters:  10/11/24 293 lb 3.2 oz (133 kg)  08/22/24 (!) 301 lb 9.6 oz (136.8 kg)  07/12/24 (!) 311 lb (141.1 kg)      Physical Exam Vitals and nursing note reviewed.  Constitutional:      General: He is not in acute distress.    Appearance: He is obese. He is not ill-appearing, toxic-appearing or diaphoretic.  HENT:     Head: Normocephalic and atraumatic.  Neck:     Thyroid : No thyroid  mass, thyromegaly or thyroid  tenderness.  Cardiovascular:     Rate and Rhythm: Normal rate and regular rhythm.     Heart sounds: Normal heart sounds. No murmur heard. Pulmonary:     Effort: Pulmonary effort is normal. No respiratory distress.     Breath sounds: Normal breath sounds.  Abdominal:     General: Bowel sounds are normal.     Palpations: Abdomen is soft.  Musculoskeletal:     Right lower leg: No edema.     Left lower leg: No edema.  Skin:    General: Skin is warm and dry.  Neurological:     General: No focal deficit present.      Mental Status: He is alert and oriented to person, place, and time.  Psychiatric:        Mood and Affect: Mood normal.        Behavior: Behavior normal.        Thought Content: Thought content normal.        Judgment: Judgment normal.      No results found for any visits on 10/11/24.    The 10-year ASCVD risk score (Arnett DK, et al., 2019) is: 18.6%    Assessment & Plan:   Daniel Scott was seen today for medical management of chronic issues.  Diagnoses and all orders for this visit:  Type 2 diabetes mellitus with hyperglycemia, without long-term current use of insulin  (HCC) -     Bayer DCA Hb A1c Waived -     atorvastatin  (LIPITOR ) 80 MG tablet; Take 1 tablet (80 mg total) by mouth daily. -     Discontinue: empagliflozin  (JARDIANCE ) 25 MG TABS tablet; TAKE 1 TABLET BY MOUTH EVERY DAY BEFORE BREAKFAST -     lisinopril  (ZESTRIL ) 20 MG tablet; Take 1 tablet (20 mg total) by mouth daily. -     metFORMIN  (GLUCOPHAGE -XR) 500 MG 24 hr tablet; Take 2 tablets (1,000 mg total) by mouth 2 (two) times daily with a meal. -     tirzepatide  (MOUNJARO ) 10 MG/0.5ML Pen; Inject 10 mg into the skin once a week.  Hypertension associated with type 2 diabetes mellitus (HCC) -     amLODipine  (NORVASC ) 10 MG tablet; Take 1 tablet (10 mg total) by mouth daily. -     metoprolol  succinate (TOPROL  XL) 25 MG 24 hr tablet; Take 1 tablet (25 mg total) by mouth daily.  Hyperlipidemia associated with type 2 diabetes mellitus (HCC) -     atorvastatin  (LIPITOR ) 80 MG tablet; Take 1 tablet (80 mg total) by mouth daily.  Morbid obesity (HCC)  Aortic atherosclerosis -     atorvastatin  (LIPITOR ) 80 MG tablet; Take 1 tablet (80 mg total) by mouth daily.  Acquired hypothyroidism -     TSH  Anxiety -     escitalopram  (LEXAPRO ) 20 MG tablet; Take 1 tablet (20 mg total) by mouth daily.  Depression, major, recurrent, moderate (HCC) -     escitalopram  (  LEXAPRO ) 20 MG tablet; Take 1 tablet (20 mg total) by mouth  daily.  NSVT (nonsustained ventricular tachycardia) (HCC)   Assessment and Plan    Type 2 diabetes mellitus with assoc HTN and HLD Well-controlled with A1c of 5.9. Mounjaro  effective. Jardiance  not indicated due to absence of kidney disease or elevated microalbumins. - Continue Mounjaro  at current dosage. - Discontinued Jardiance  per patient preference as A1c is now well controlled - BP at goal - On statin.   Morbid obesity Significant weight loss since March 2013. Current weight below 300 pounds.   Acquired hypothyroidism - Checked thyroid  levels.  Aortic atherosclerosis On aspirin   Major depressive disorder, recurrent GAD Recent increase in Lexapro  to 20 mg with slight mood improvement. Therapy needed. He is considering therapy options and motivated to pursue despite challenges. - Encouraged follow-up with a therapist.  Nonsustained ventricular tachycardia Denies symptoms. - Continue metoprolol       Return in about 4 months (around 02/08/2025) for chronic follow up.   The patient indicates understanding of these issues and agrees with the plan.  Daniel CHRISTELLA Search, FNP "

## 2024-12-06 ENCOUNTER — Encounter: Admitting: Family Medicine

## 2024-12-20 ENCOUNTER — Encounter: Payer: Self-pay | Admitting: Family Medicine

## 2024-12-23 ENCOUNTER — Other Ambulatory Visit

## 2025-01-01 ENCOUNTER — Ambulatory Visit: Admitting: Urology

## 2025-02-06 ENCOUNTER — Encounter: Admitting: Family Medicine
